# Patient Record
Sex: Female | Born: 1955
Health system: Southern US, Community
[De-identification: ages and names within clinical notes are randomized; demographics above are authoritative.]

## PROBLEM LIST (undated history)

## (undated) DIAGNOSIS — R112 Nausea with vomiting, unspecified: Secondary | ICD-10-CM

## (undated) DIAGNOSIS — K219 Gastro-esophageal reflux disease without esophagitis: Secondary | ICD-10-CM

## (undated) DIAGNOSIS — I2510 Atherosclerotic heart disease of native coronary artery without angina pectoris: Secondary | ICD-10-CM

## (undated) DIAGNOSIS — I4719 Other supraventricular tachycardia: Secondary | ICD-10-CM

## (undated) DIAGNOSIS — Z9289 Personal history of other medical treatment: Secondary | ICD-10-CM

## (undated) DIAGNOSIS — C50919 Malignant neoplasm of unspecified site of unspecified female breast: Secondary | ICD-10-CM

## (undated) DIAGNOSIS — Z9889 Other specified postprocedural states: Secondary | ICD-10-CM

## (undated) DIAGNOSIS — I4892 Unspecified atrial flutter: Secondary | ICD-10-CM

## (undated) DIAGNOSIS — I471 Supraventricular tachycardia, unspecified: Secondary | ICD-10-CM

## (undated) DIAGNOSIS — I480 Paroxysmal atrial fibrillation: Secondary | ICD-10-CM

## (undated) DIAGNOSIS — R931 Abnormal findings on diagnostic imaging of heart and coronary circulation: Secondary | ICD-10-CM

## (undated) HISTORY — DX: Abnormal findings on diagnostic imaging of heart and coronary circulation: R93.1

## (undated) HISTORY — PX: NECK SURGERY: SHX720

## (undated) HISTORY — DX: Supraventricular tachycardia, unspecified: I47.10

## (undated) HISTORY — DX: Atherosclerotic heart disease of native coronary artery without angina pectoris: I25.10

## (undated) HISTORY — PX: ABLATION: SHX5711

## (undated) HISTORY — PX: CHOLECYSTECTOMY: SHX55

## (undated) HISTORY — DX: Unspecified atrial flutter: I48.92

## (undated) HISTORY — DX: Other supraventricular tachycardia: I47.19

## (undated) HISTORY — DX: Malignant neoplasm of unspecified site of unspecified female breast: C50.919

## (undated) HISTORY — PX: ABDOMINAL HYSTERECTOMY: SHX81

## (undated) HISTORY — PX: BREAST LUMPECTOMY: SHX2

## (undated) HISTORY — DX: Supraventricular tachycardia: I47.1

## (undated) HISTORY — PX: KNEE ARTHROSCOPY: SUR90

## (undated) HISTORY — DX: Paroxysmal atrial fibrillation: I48.0

## (undated) HISTORY — DX: Personal history of other medical treatment: Z92.89

---

## 2000-06-19 ENCOUNTER — Encounter: Payer: Self-pay | Admitting: Neurosurgery

## 2000-06-19 ENCOUNTER — Ambulatory Visit (HOSPITAL_COMMUNITY): Admission: RE | Admit: 2000-06-19 | Discharge: 2000-06-19 | Payer: Self-pay | Admitting: Neurosurgery

## 2000-06-25 ENCOUNTER — Inpatient Hospital Stay (HOSPITAL_COMMUNITY): Admission: RE | Admit: 2000-06-25 | Discharge: 2000-06-27 | Payer: Self-pay | Admitting: Neurosurgery

## 2000-06-25 ENCOUNTER — Encounter: Payer: Self-pay | Admitting: Neurosurgery

## 2000-06-26 ENCOUNTER — Encounter: Payer: Self-pay | Admitting: Neurosurgery

## 2000-07-13 ENCOUNTER — Encounter: Payer: Self-pay | Admitting: Neurosurgery

## 2000-07-13 ENCOUNTER — Encounter: Admission: RE | Admit: 2000-07-13 | Discharge: 2000-07-13 | Payer: Self-pay | Admitting: Neurosurgery

## 2000-09-28 ENCOUNTER — Encounter: Admission: RE | Admit: 2000-09-28 | Discharge: 2000-09-28 | Payer: Self-pay | Admitting: Neurosurgery

## 2000-09-28 ENCOUNTER — Encounter: Payer: Self-pay | Admitting: Neurosurgery

## 2002-01-13 HISTORY — PX: BREAST EXCISIONAL BIOPSY: SUR124

## 2002-02-11 ENCOUNTER — Ambulatory Visit (HOSPITAL_BASED_OUTPATIENT_CLINIC_OR_DEPARTMENT_OTHER): Admission: RE | Admit: 2002-02-11 | Discharge: 2002-02-11 | Payer: Self-pay | Admitting: *Deleted

## 2002-02-11 ENCOUNTER — Encounter: Admission: RE | Admit: 2002-02-11 | Discharge: 2002-02-11 | Payer: Self-pay | Admitting: *Deleted

## 2002-08-07 ENCOUNTER — Ambulatory Visit (HOSPITAL_BASED_OUTPATIENT_CLINIC_OR_DEPARTMENT_OTHER): Admission: RE | Admit: 2002-08-07 | Discharge: 2002-08-07 | Payer: Self-pay | Admitting: Orthopedic Surgery

## 2003-08-26 ENCOUNTER — Encounter (HOSPITAL_COMMUNITY): Admission: RE | Admit: 2003-08-26 | Discharge: 2003-09-01 | Payer: Self-pay | Admitting: *Deleted

## 2003-09-28 ENCOUNTER — Ambulatory Visit (HOSPITAL_BASED_OUTPATIENT_CLINIC_OR_DEPARTMENT_OTHER): Admission: RE | Admit: 2003-09-28 | Discharge: 2003-09-28 | Payer: Self-pay | Admitting: *Deleted

## 2003-09-28 ENCOUNTER — Ambulatory Visit (HOSPITAL_COMMUNITY): Admission: RE | Admit: 2003-09-28 | Discharge: 2003-09-28 | Payer: Self-pay | Admitting: *Deleted

## 2005-05-15 ENCOUNTER — Ambulatory Visit: Payer: Self-pay | Admitting: Cardiology

## 2005-08-08 ENCOUNTER — Encounter: Admission: RE | Admit: 2005-08-08 | Discharge: 2005-08-08 | Payer: Self-pay | Admitting: Otolaryngology

## 2005-08-18 ENCOUNTER — Encounter: Admission: RE | Admit: 2005-08-18 | Discharge: 2005-08-18 | Payer: Self-pay | Admitting: Otolaryngology

## 2011-12-23 DIAGNOSIS — M76899 Other specified enthesopathies of unspecified lower limb, excluding foot: Secondary | ICD-10-CM | POA: Insufficient documentation

## 2012-07-02 ENCOUNTER — Other Ambulatory Visit: Payer: Self-pay | Admitting: Radiology

## 2012-07-02 DIAGNOSIS — R922 Inconclusive mammogram: Secondary | ICD-10-CM

## 2012-07-07 ENCOUNTER — Ambulatory Visit
Admission: RE | Admit: 2012-07-07 | Discharge: 2012-07-07 | Disposition: A | Discharge status: Home / Self Care | Payer: BC Managed Care – PPO | Source: Ambulatory Visit | Attending: Radiology | Admitting: Radiology

## 2012-07-07 DIAGNOSIS — R922 Inconclusive mammogram: Secondary | ICD-10-CM

## 2012-07-07 MED ORDER — GADOBENATE DIMEGLUMINE 529 MG/ML IV SOLN
12.0000 mL | Freq: Once | INTRAVENOUS | Status: AC | PRN
  Administered 2012-07-07: 12 mL via INTRAVENOUS

## 2015-05-07 LAB — HM COLONOSCOPY

## 2016-06-05 ENCOUNTER — Ambulatory Visit (INDEPENDENT_AMBULATORY_CARE_PROVIDER_SITE_OTHER): Payer: BC Managed Care – PPO | Admitting: Cardiology

## 2016-06-05 ENCOUNTER — Telehealth: Payer: Self-pay | Admitting: Cardiology

## 2016-06-05 ENCOUNTER — Encounter: Payer: Self-pay | Admitting: Cardiology

## 2016-06-05 ENCOUNTER — Encounter: Payer: Self-pay | Admitting: *Deleted

## 2016-06-05 VITALS — BP 151/82 | HR 58 | Ht 65.0 in | Wt 134.2 lb

## 2016-06-05 DIAGNOSIS — R011 Cardiac murmur, unspecified: Secondary | ICD-10-CM | POA: Diagnosis not present

## 2016-06-05 DIAGNOSIS — R030 Elevated blood-pressure reading, without diagnosis of hypertension: Secondary | ICD-10-CM | POA: Diagnosis not present

## 2016-06-05 DIAGNOSIS — I4892 Unspecified atrial flutter: Secondary | ICD-10-CM | POA: Diagnosis not present

## 2016-06-05 NOTE — Patient Instructions (Signed)
Your physician recommends that you schedule a follow-up appointment in: [place] has recommended you make the following change in your medication:   START ASPIRIN 78 MG DAILY  Your physician has requested that you have an echocardiogram. Echocardiography is a painless test that uses sound waves to create images of your heart. It provides your doctor with information about the size and shape of your heart and how well your heart's chambers and valves are working. This procedure takes approximately one hour. There are no restrictions for this procedure.  Thank you for choosing [facility]!!

## 2016-06-05 NOTE — Progress Notes (Addendum)
Clinical Summary Theresa Hale is a 61 y.o.female seen as new apatient, she is referred by Dr Quillian Quince for atrial flutter.   1. Atrial flutter - several year history of palpitations. Reports a normal cardiac monitor several years ago.  - she reports a recent episode much more severe than any prior. Lasted 3 days, severe palpitations. No other associated symptoms - seen by pcp, EKG at that visit reviewed and shows aflutter with RVR.  - she was started on Toprol XL 25mg  prn. After taking 2 doses symptoms resolved, has had no recurrence.      Past Medical History:  Diagnosis Date  . Atrial flutter (HCC)      Allergies  Allergen Reactions  . Penicillins Swelling  . Sulfa Antibiotics Nausea And Vomiting     Current Outpatient Prescriptions  Medication Sig Dispense Refill  . aspirin EC 81 MG tablet Take 81 mg by mouth daily.    . metoprolol succinate (TOPROL XL) 25 MG 24 hr tablet Take 25 mg by mouth daily as needed.     No current facility-administered medications for this visit.      Past Surgical History:  Procedure Laterality Date  . ABDOMINAL HYSTERECTOMY    . BREAST LUMPECTOMY    . CESAREAN SECTION    . CHOLECYSTECTOMY    . KNEE ARTHROSCOPY    . NECK SURGERY       Allergies  Allergen Reactions  . Penicillins Swelling  . Sulfa Antibiotics Nausea And Vomiting      Family History  Problem Relation Age of Onset  . Stroke Mother   . Breast cancer Mother   . Parkinson's disease Father   . Alzheimer's disease Father   . Breast cancer Sister      Social History Ms. Byers reports that she has never smoked. She has never used smokeless tobacco. Ms. Brosh has no alcohol history on file.   Review of Systems CONSTITUTIONAL: No weight loss, fever, chills, weakness or fatigue.  HEENT: Eyes: No visual loss, blurred vision, double vision or yellow sclerae.No hearing loss, sneezing, congestion, runny nose or sore throat.  SKIN: No rash or itching.    CARDIOVASCULAR: per HPI RESPIRATORY: No shortness of breath, cough or sputum.  GASTROINTESTINAL: No anorexia, nausea, vomiting or diarrhea. No abdominal pain or blood.  GENITOURINARY: No burning on urination, no polyuria NEUROLOGICAL: No headache, dizziness, syncope, paralysis, ataxia, numbness or tingling in the extremities. No change in bowel or bladder control.  MUSCULOSKELETAL: No muscle, back pain, joint pain or stiffness.  LYMPHATICS: No enlarged nodes. No history of splenectomy.  PSYCHIATRIC: No history of depression or anxiety.  ENDOCRINOLOGIC: No reports of sweating, cold or heat intolerance. No polyuria or polydipsia.  Marland Kitchen   Physical Examination Vitals:   06/05/16 1017 06/05/16 1025  BP: (!) 146/79 (!) 151/82  Pulse: (!) 58 (!) 58   Filed Weights   06/05/16 1017  Weight: 134 lb 3.2 oz (60.9 kg)    Gen: resting comfortably, no acute distress HEENT: no scleral icterus, pupils equal round and reactive, no palptable cervical adenopathy,  CV: RRR, 2/6 systolic murmur at apex, no jvd Resp: Clear to auscultation bilaterally GI: abdomen is soft, non-tender, non-distended, normal bowel sounds, no hepatosplenomegaly MSK: extremities are warm, no edema.  Skin: warm, no rash Neuro:  no focal deficits Psych: appropriate affect      Assessment and Plan  1. Aflutter - new diagnosis of paroxysmal aflutter. - no recurrence of symptoms, she  is on Toprol XL prn. If recurrent would try Toprol XL 12.5mg  daily, somewhat limited on av nodal agent dosing given her baseline low normal heart rates. If refractory to medical therapy or cannot tolerate can consider ablation.  - EKG in clinic today show SR at 58 - CHADS2Vasc score is 1, start ASA 81mg  daily - request labs from pcp, if K,Mg, TSH not included will need to order next visit. Obtain echo.  2. Heart murmur - obtain echo  3. Elevated blood pressure - isolated elevated bp today, from pcp note she was at goal during that visit.  No history of HTN - continue to monitor at this time, if recurrent may need medical therapy   F/u 4 months   Arnoldo Lenis, M.D.

## 2016-06-05 NOTE — Telephone Encounter (Signed)
ECHO scheduled May 9th in [place]

## 2016-06-13 ENCOUNTER — Ambulatory Visit: Payer: BC Managed Care – PPO | Admitting: Cardiology

## 2016-06-21 ENCOUNTER — Other Ambulatory Visit: Payer: Self-pay

## 2016-06-21 ENCOUNTER — Ambulatory Visit (INDEPENDENT_AMBULATORY_CARE_PROVIDER_SITE_OTHER): Payer: BC Managed Care – PPO

## 2016-06-21 DIAGNOSIS — I4892 Unspecified atrial flutter: Secondary | ICD-10-CM

## 2016-06-26 ENCOUNTER — Telehealth: Payer: Self-pay | Admitting: *Deleted

## 2016-06-26 NOTE — Telephone Encounter (Signed)
Pt aware and voiced understanding - routed to pcp  

## 2016-06-26 NOTE — Telephone Encounter (Signed)
-----   Message from Arnoldo Lenis, MD sent at 06/23/2016  3:58 PM EDT ----- Echo looks good, normal heart function. Some of her heart valves are mildly thickened which creates a murmur but overall are working just fine, this is not something to worry about  J BrancH MD

## 2016-10-11 ENCOUNTER — Telehealth: Payer: Self-pay | Admitting: *Deleted

## 2016-10-11 NOTE — Telephone Encounter (Signed)
Cx Friday appt - 6 month recall placed

## 2016-10-11 NOTE — Telephone Encounter (Signed)
That would be fine ° °J Ida Uppal MD °

## 2016-10-11 NOTE — Telephone Encounter (Signed)
Pt scheduled for Friday 8/31 but says since echo was normal and pt denies symptoms she would like to push appt out for 6 months and would call if needed prior - routed to Dr Harl Bowie if ok

## 2016-10-13 ENCOUNTER — Ambulatory Visit: Payer: BC Managed Care – PPO | Admitting: Cardiology

## 2017-02-13 HISTORY — PX: BREAST LUMPECTOMY: SHX2

## 2017-10-02 HISTORY — PX: BREAST BIOPSY: SHX20

## 2017-10-09 ENCOUNTER — Encounter: Payer: Self-pay | Admitting: Hematology and Oncology

## 2017-10-09 ENCOUNTER — Other Ambulatory Visit: Payer: Self-pay | Admitting: Radiology

## 2017-10-11 ENCOUNTER — Other Ambulatory Visit: Payer: Self-pay | Admitting: Radiology

## 2017-10-18 ENCOUNTER — Inpatient Hospital Stay: Payer: BC Managed Care – PPO | Attending: Hematology and Oncology | Admitting: Hematology and Oncology

## 2017-10-18 ENCOUNTER — Other Ambulatory Visit: Payer: Self-pay | Admitting: General Surgery

## 2017-10-18 ENCOUNTER — Telehealth: Payer: Self-pay | Admitting: *Deleted

## 2017-10-18 ENCOUNTER — Encounter: Payer: Self-pay | Admitting: *Deleted

## 2017-10-18 DIAGNOSIS — Z1239 Encounter for other screening for malignant neoplasm of breast: Secondary | ICD-10-CM

## 2017-10-18 DIAGNOSIS — C50411 Malignant neoplasm of upper-outer quadrant of right female breast: Secondary | ICD-10-CM | POA: Diagnosis present

## 2017-10-18 DIAGNOSIS — Z170 Estrogen receptor positive status [ER+]: Secondary | ICD-10-CM | POA: Diagnosis not present

## 2017-10-18 DIAGNOSIS — Z803 Family history of malignant neoplasm of breast: Secondary | ICD-10-CM | POA: Insufficient documentation

## 2017-10-18 MED ORDER — LETROZOLE 2.5 MG PO TABS: 2.5000 mg | ORAL_TABLET | Freq: Every day | ORAL | 3 refills | Status: DC

## 2017-10-18 NOTE — Assessment & Plan Note (Signed)
10/09/2017: Palpable lump in the right breast with a history of right breast atypia in mother and sister with breast cancers, 1.9 cm mass at 10 o'clock position right breast biopsy 10 o'clock position 5 cm from nipple: Grade 1 invasive ductal carcinoma ER 60%, PR 40%, Ki-67 2%, HER-2 negative ratio 1.35 T1cN1a stage Ib AJCC 8  Pathology and radiology counseling:Discussed with the patient, the details of pathology including the type of breast cancer,the clinical staging, the significance of ER, PR and HER-2/neu receptors and the implications for treatment. After reviewing the pathology in detail, we proceeded to discuss the different treatment options between surgery, radiation, chemotherapy, antiestrogen therapies.  Recommendations: 1. Breast conserving surgery with axillary node dissection followed by 2. Mammaprint testing to determine if chemotherapy would be of any benefit followed by 3. Adjuvant radiation therapy followed by 4. Adjuvant antiestrogen therapy  Mammaprint counseling: MINDACT is a prospective, randomized phase III controlled trial that investigates the clinical utility of MammaPrint, when compared to standard clinical pathological criteria, with 6,693 patients enrolled from over 111 institutions. Clinical high-risk patients with a Low Risk MammaPrint result, including 48% node-positive, had 5-year distant metastasis-free survival rate in excess of 94 percent, whether randomized to receive adjuvant chemotherapy or not proving MammaPrint's ability to safely identify Low Risk patients.  Return to clinic after surgery to discuss final pathology report and then determine if Mammaprint testing will need to be sent.

## 2017-10-18 NOTE — Progress Notes (Signed)
[place] CONSULT NOTE  Patient Care Team: Caryl Bis, MD as PCP - General (Family Medicine)  CHIEF COMPLAINTS/PURPOSE OF CONSULTATION:  Newly diagnosed breast cancer  HISTORY OF PRESENTING ILLNESS:  Theresa Hale 62 y.o. female is here because of recent diagnosis of right breast cancer.  Patient felt a lump in the right breast and underwent evaluation at [facility].  Initial mammograms did not show any abnormality but after further evaluation and ultrasound she was noted to have a 1.9 cm mass at 10 o'clock position of the right breast.  Biopsy of the breast mass was positive for invasive ductal carcinoma grade 1 that was ER PR positive HER-2 negative with a Ki-67 of 2%.  She had an ultrasound of the axilla which revealed one enlarged lymph node.  Biopsy of the lymph node was also positive for breast cancer.  She was seen by Dr. Marlou Starks who recommended neoadjuvant therapy and she was sent to [place] for further discussion.  She is accompanied today by her husband.  They have a plan to spend 2 weeks in [place] in the coming week.  I reviewed her records extensively and collaborated the history with the patient.  SUMMARY OF ONCOLOGIC HISTORY:   Malignant neoplasm of upper-outer quadrant of right breast in female, estrogen receptor positive ([place])   10/09/2017 Initial Diagnosis    Palpable lump in the right breast with a history of right breast atypia in mother and sister with breast cancers, 1.9 cm mass at 10 o'clock position right breast biopsy 10 o'clock position 5 cm from nipple: Grade 1 invasive ductal carcinoma ER 60%, PR 40%, Ki-67 2%, HER-2 negative ratio 1.35 T1cN1a stage Ib AJCC 8    MEDICAL HISTORY:  Past Medical History:  Diagnosis Date  . Atrial flutter ([place])     SURGICAL HISTORY:  The histories are not reviewed yet. Please review them in the "History" navigator section and refresh this [place].  SOCIAL HISTORY: Social History   Socioeconomic History  . Marital  status: Married    Spouse name: Not on file  . Number of children: Not on file  . Years of education: Not on file  . Highest education level: Not on file  Occupational History  . Not on file  Social Needs  . Financial resource strain: Not on file  . Food insecurity:    Worry: Not on file    Inability: Not on file  . Transportation needs:    Medical: Not on file    Non-medical: Not on file  Tobacco Use  . Smoking status: Never Smoker  . Smokeless tobacco: Never Used  Substance and Sexual Activity  . Alcohol use: Not on file  . Drug use: Not on file  . Sexual activity: Not on file  Lifestyle  . Physical activity:    Days per week: Not on file    Minutes per session: Not on file  . Stress: Not on file  Relationships  . Social connections:    Talks on phone: Not on file    Gets together: Not on file    Attends religious service: Not on file    Active member of club or organization: Not on file    Attends meetings of clubs or organizations: Not on file    Relationship status: Not on file  . Intimate partner violence:    Fear of current or ex partner: Not on file    Emotionally abused: Not on file    Physically abused:  Not on file    Forced sexual activity: Not on file  Other Topics Concern  . Not on file  Social History Narrative  . Not on file    FAMILY HISTORY: Family History  Problem Relation Age of Onset  . Stroke Mother   . Breast cancer Mother   . Parkinson's disease Father   . Alzheimer's disease Father   . Breast cancer Sister     ALLERGIES:  is allergic to penicillins and sulfa antibiotics.  MEDICATIONS:  Current Outpatient Medications  Medication Sig Dispense Refill  . letrozole (FEMARA) 2.5 MG tablet Take 1 tablet (2.5 mg total) by mouth daily. 90 tablet 3  . metoprolol succinate (TOPROL XL) 25 MG 24 hr tablet Take 25 mg by mouth daily as needed.     No current facility-administered medications for this visit.     REVIEW OF SYSTEMS:    Constitutional: Denies fevers, chills or abnormal night sweats Eyes: Denies blurriness of vision, double vision or watery eyes Ears, nose, mouth, throat, and face: Denies mucositis or sore throat Respiratory: Denies cough, dyspnea or wheezes Cardiovascular: Denies palpitation, chest discomfort or lower extremity swelling Gastrointestinal:  Denies nausea, heartburn or change in bowel habits Skin: Denies abnormal skin rashes Lymphatics: Denies new lymphadenopathy or easy bruising Neurological:Denies numbness, tingling or new weaknesses Behavioral/Psych: Mood is stable, no new changes  Breast: Palpable lump in the right breast All other systems were reviewed with the patient and are negative.  PHYSICAL EXAMINATION: ECOG PERFORMANCE STATUS: 1 - Symptomatic but completely ambulatory  Vitals:   10/18/17 1327  BP: (!) 161/70  Pulse: 71  Resp: 19  Temp: 98 F (36.7 C)  SpO2: 99%   Filed Weights   10/18/17 1327  Weight: 130 lb 12.8 oz (59.3 kg)    GENERAL:alert, no distress and comfortable SKIN: skin color, texture, turgor are normal, no rashes or significant lesions EYES: normal, conjunctiva are pink and non-injected, sclera clear OROPHARYNX:no exudate, no erythema and lips, buccal mucosa, and tongue normal  NECK: supple, thyroid normal size, non-tender, without nodularity LYMPH:  no palpable lymphadenopathy in the cervical, axillary or inguinal LUNGS: clear to auscultation and percussion with normal breathing effort HEART: regular rate & rhythm and no murmurs and no lower extremity edema ABDOMEN:abdomen soft, non-tender and normal bowel sounds Musculoskeletal:no cyanosis of digits and no clubbing  PSYCH: alert & oriented x 3 with fluent speech NEURO: no focal motor/sensory deficits BREAST: Scar tissue from previous breast surgeries and palpable lump in the right breast. No palpable axillary or supraclavicular lymphadenopathy (exam performed in the presence of a chaperone)    RADIOGRAPHIC STUDIES: I have personally reviewed the radiological reports and agreed with the findings in the report.  ASSESSMENT AND PLAN:  Malignant neoplasm of upper-outer quadrant of right breast in female, estrogen receptor positive ([place]) 10/09/2017: Palpable lump in the right breast with a history of right breast atypia in mother and sister with breast cancers, 1.9 cm mass at 10 o'clock position right breast biopsy 10 o'clock position 5 cm from nipple: Grade 1 invasive ductal carcinoma ER 60%, PR 40%, Ki-67 2%, HER-2 negative ratio 1.35 T1cN1a stage Ib AJCC 8  Pathology and radiology counseling:Discussed with the patient, the details of pathology including the type of breast cancer,the clinical staging, the significance of ER, PR and HER-2/neu receptors and the implications for treatment. After reviewing the pathology in detail, we proceeded to discuss the different treatment options between surgery, radiation, chemotherapy, antiestrogen therapies.  Recommendations: 1.  Neoadjuvant  therapy: Based upon Mammaprint test results we may need to do neoadjuvant chemotherapy.  If it is low risk then she will remain on neoadjuvant antiestrogen therapy for 6 months.   2. Breast conserving surgery with targeted node dissection followed by 3. Adjuvant radiation therapy followed by 4.  Continued adjuvant antiestrogen therapy  Mammaprint counseling: MINDACT is a prospective, randomized phase III controlled trial that investigates the clinical utility of MammaPrint, when compared to standard clinical pathological criteria, with 6,693 patients enrolled from over 111 institutions. Clinical high-risk patients with a Low Risk MammaPrint result, including 48% node-positive, had 5-year distant metastasis-free survival rate in excess of 94 percent, whether randomized to receive adjuvant chemotherapy or not proving MammaPrint's ability to safely identify Low Risk patients.  Letrozole counseling:We discussed the  risks and benefits of anti-estrogen therapy with aromatase inhibitors. These include but not limited to insomnia, hot flashes, mood changes, vaginal dryness, bone density loss, and weight gain. We strongly believe that the benefits far outweigh the risks. Patient understands these risks and consented to starting treatment. Planned treatment duration is 7 years.  Return to clinic in 3 months with a mammogram and ultrasound.  All questions were answered. The patient knows to call the clinic with any problems, questions or concerns.    Harriette Ohara, MD 10/18/17

## 2017-10-18 NOTE — Telephone Encounter (Signed)
New patient referral sent over by Dr. Marlou Starks.  [facility] notified patient of appointment today 10/18/17 at 1:30pm.

## 2017-10-19 ENCOUNTER — Telehealth: Payer: Self-pay | Admitting: *Deleted

## 2017-10-19 ENCOUNTER — Telehealth: Payer: Self-pay | Admitting: Hematology and Oncology

## 2017-10-19 NOTE — Telephone Encounter (Signed)
Ordered mammaprint (core) per Dr. Lindi Adie.  Faxed requisition to [facility] and confirmed receipt.

## 2017-10-19 NOTE — Telephone Encounter (Signed)
Mailed pt calendar  °

## 2017-10-21 ENCOUNTER — Ambulatory Visit
Admission: RE | Admit: 2017-10-21 | Discharge: 2017-10-21 | Disposition: A | Discharge status: Home / Self Care | Payer: BC Managed Care – PPO | Source: Ambulatory Visit | Attending: General Surgery | Admitting: General Surgery

## 2017-10-21 DIAGNOSIS — C50411 Malignant neoplasm of upper-outer quadrant of right female breast: Secondary | ICD-10-CM

## 2017-10-21 DIAGNOSIS — Z170 Estrogen receptor positive status [ER+]: Principal | ICD-10-CM

## 2017-10-21 MED ORDER — GADOBENATE DIMEGLUMINE 529 MG/ML IV SOLN
12.0000 mL | Freq: Once | INTRAVENOUS | Status: AC | PRN
  Administered 2017-10-21: 12 mL via INTRAVENOUS

## 2017-10-26 ENCOUNTER — Telehealth: Payer: Self-pay | Admitting: *Deleted

## 2017-10-26 NOTE — Telephone Encounter (Signed)
Received Mammaprint results of low risk. Physician team notified. Pt currently in [place] will call on pt return on 9/24

## 2017-11-07 ENCOUNTER — Telehealth: Payer: Self-pay | Admitting: *Deleted

## 2017-11-07 NOTE — Telephone Encounter (Signed)
Spoke with patient to give her the low risk mammaprint results.  Patient is going to talk with Dr. Marlou Starks because he may go ahead and do surgery 1st.  Will look for sx date and then make appt back with Dr. Lindi Adie. Patient verbalized understanding.

## 2017-11-08 ENCOUNTER — Ambulatory Visit: Payer: Self-pay | Admitting: General Surgery

## 2017-11-08 DIAGNOSIS — Z170 Estrogen receptor positive status [ER+]: Principal | ICD-10-CM

## 2017-11-08 DIAGNOSIS — C50411 Malignant neoplasm of upper-outer quadrant of right female breast: Secondary | ICD-10-CM

## 2017-11-09 ENCOUNTER — Other Ambulatory Visit: Payer: Self-pay | Admitting: *Deleted

## 2017-11-12 ENCOUNTER — Telehealth: Payer: Self-pay | Admitting: Hematology and Oncology

## 2017-11-12 ENCOUNTER — Encounter: Payer: Self-pay | Admitting: Hematology and Oncology

## 2017-11-12 NOTE — Telephone Encounter (Signed)
Scheduled appt per 9/27 sch message - pt is aware of appt date and time and sent reminder letter in the mail with appt date and time.

## 2017-11-12 NOTE — Progress Notes (Signed)
Faxed office notes for a referral  to [facility] at [phone], confirmation received.

## 2017-11-19 ENCOUNTER — Other Ambulatory Visit: Payer: Self-pay

## 2017-11-19 ENCOUNTER — Encounter (HOSPITAL_BASED_OUTPATIENT_CLINIC_OR_DEPARTMENT_OTHER): Payer: Self-pay | Admitting: *Deleted

## 2017-11-19 NOTE — Progress Notes (Signed)
Pre op phone call done. Pt denies any recent feelings of her heart racing or palpitations. States that she only takes the toprol prn and has only ever needed it once. Will repeat EKG prior to surgery. Chart reviewed with Dr.Witman, no further testing needed as long as EKG is WNL.

## 2017-11-21 ENCOUNTER — Encounter (HOSPITAL_BASED_OUTPATIENT_CLINIC_OR_DEPARTMENT_OTHER)
Admission: RE | Admit: 2017-11-21 | Discharge: 2017-11-21 | Disposition: A | Discharge status: Home / Self Care | Payer: BC Managed Care – PPO | Source: Ambulatory Visit | Attending: General Surgery | Admitting: General Surgery

## 2017-11-21 DIAGNOSIS — Z01810 Encounter for preprocedural cardiovascular examination: Secondary | ICD-10-CM | POA: Insufficient documentation

## 2017-11-21 NOTE — Progress Notes (Signed)
Ensure pre surgery drink given with instructions to complete  By [place], pt verbalized understanding.

## 2017-11-22 DIAGNOSIS — C50919 Malignant neoplasm of unspecified site of unspecified female breast: Secondary | ICD-10-CM | POA: Insufficient documentation

## 2017-11-23 ENCOUNTER — Telehealth: Payer: Self-pay | Admitting: Hematology and Oncology

## 2017-11-23 NOTE — Telephone Encounter (Signed)
Faxed medical records to [facility], Release ID: [id number]

## 2017-11-26 ENCOUNTER — Ambulatory Visit (HOSPITAL_BASED_OUTPATIENT_CLINIC_OR_DEPARTMENT_OTHER): Payer: BC Managed Care – PPO | Admitting: Anesthesiology

## 2017-11-26 ENCOUNTER — Encounter (HOSPITAL_BASED_OUTPATIENT_CLINIC_OR_DEPARTMENT_OTHER): Payer: Self-pay | Admitting: Certified Registered"

## 2017-11-26 ENCOUNTER — Encounter (HOSPITAL_BASED_OUTPATIENT_CLINIC_OR_DEPARTMENT_OTHER)
Admission: RE | Disposition: A | Discharge status: Home / Self Care | Payer: Self-pay | Source: Ambulatory Visit | Attending: General Surgery

## 2017-11-26 ENCOUNTER — Encounter (HOSPITAL_COMMUNITY)
Admission: RE | Admit: 2017-11-26 | Discharge: 2017-11-26 | Disposition: A | Discharge status: Home / Self Care | Payer: BC Managed Care – PPO | Source: Ambulatory Visit | Attending: General Surgery | Admitting: General Surgery

## 2017-11-26 ENCOUNTER — Ambulatory Visit (HOSPITAL_BASED_OUTPATIENT_CLINIC_OR_DEPARTMENT_OTHER)
Admission: RE | Admit: 2017-11-26 | Discharge: 2017-11-26 | Disposition: A | Discharge status: Home / Self Care | Payer: BC Managed Care – PPO | Source: Ambulatory Visit | Attending: General Surgery | Admitting: General Surgery

## 2017-11-26 DIAGNOSIS — Z880 Allergy status to penicillin: Secondary | ICD-10-CM | POA: Diagnosis not present

## 2017-11-26 DIAGNOSIS — Z170 Estrogen receptor positive status [ER+]: Secondary | ICD-10-CM | POA: Insufficient documentation

## 2017-11-26 DIAGNOSIS — C50411 Malignant neoplasm of upper-outer quadrant of right female breast: Secondary | ICD-10-CM

## 2017-11-26 DIAGNOSIS — Z8249 Family history of ischemic heart disease and other diseases of the circulatory system: Secondary | ICD-10-CM | POA: Insufficient documentation

## 2017-11-26 DIAGNOSIS — Z882 Allergy status to sulfonamides status: Secondary | ICD-10-CM | POA: Diagnosis not present

## 2017-11-26 DIAGNOSIS — Z90710 Acquired absence of both cervix and uterus: Secondary | ICD-10-CM | POA: Diagnosis not present

## 2017-11-26 DIAGNOSIS — Z803 Family history of malignant neoplasm of breast: Secondary | ICD-10-CM | POA: Diagnosis not present

## 2017-11-26 DIAGNOSIS — C773 Secondary and unspecified malignant neoplasm of axilla and upper limb lymph nodes: Secondary | ICD-10-CM | POA: Diagnosis not present

## 2017-11-26 HISTORY — DX: Gastro-esophageal reflux disease without esophagitis: K21.9

## 2017-11-26 HISTORY — PX: BREAST LUMPECTOMY WITH RADIOACTIVE SEED AND SENTINEL LYMPH NODE BIOPSY: SHX6550

## 2017-11-26 HISTORY — DX: Nausea with vomiting, unspecified: R11.2

## 2017-11-26 HISTORY — DX: Other specified postprocedural states: Z98.890

## 2017-11-26 SURGERY — BREAST LUMPECTOMY WITH RADIOACTIVE SEED AND SENTINEL LYMPH NODE BIOPSY
Anesthesia: General | Site: Breast | Laterality: Right

## 2017-11-26 MED ORDER — ONDANSETRON HCL 4 MG/2ML IJ SOLN
INTRAMUSCULAR | Status: DC | PRN
  Administered 2017-11-26: 4 mg via INTRAVENOUS

## 2017-11-26 MED ORDER — MIDAZOLAM HCL 2 MG/2ML IJ SOLN
1.0000 mg | INTRAMUSCULAR | Status: DC | PRN
  Administered 2017-11-26: 2 mg via INTRAVENOUS

## 2017-11-26 MED ORDER — VANCOMYCIN HCL IN DEXTROSE 1-5 GM/200ML-% IV SOLN
INTRAVENOUS | Status: AC
  Filled 2017-11-26: qty 200

## 2017-11-26 MED ORDER — FENTANYL CITRATE (PF) 100 MCG/2ML IJ SOLN: 25.0000 ug | INTRAMUSCULAR | Status: DC | PRN

## 2017-11-26 MED ORDER — FENTANYL CITRATE (PF) 100 MCG/2ML IJ SOLN
50.0000 ug | INTRAMUSCULAR | Status: DC | PRN
  Administered 2017-11-26: 25 ug via INTRAVENOUS
  Administered 2017-11-26: 100 ug via INTRAVENOUS

## 2017-11-26 MED ORDER — BUPIVACAINE-EPINEPHRINE 0.25% -1:200000 IJ SOLN
INTRAMUSCULAR | Status: DC | PRN
  Administered 2017-11-26: 18 mL

## 2017-11-26 MED ORDER — PROPOFOL 10 MG/ML IV BOLUS
INTRAVENOUS | Status: DC | PRN
  Administered 2017-11-26: 150 mg via INTRAVENOUS

## 2017-11-26 MED ORDER — CHLORHEXIDINE GLUCONATE CLOTH 2 % EX PADS: 6.0000 | MEDICATED_PAD | Freq: Once | CUTANEOUS | Status: DC

## 2017-11-26 MED ORDER — TECHNETIUM TC 99M SULFUR COLLOID FILTERED
1.0000 | Freq: Once | INTRAVENOUS | Status: AC | PRN
  Administered 2017-11-26: 1 via INTRADERMAL

## 2017-11-26 MED ORDER — FENTANYL CITRATE (PF) 100 MCG/2ML IJ SOLN
INTRAMUSCULAR | Status: AC
  Filled 2017-11-26: qty 2

## 2017-11-26 MED ORDER — LACTATED RINGERS IV SOLN
INTRAVENOUS | Status: DC
  Administered 2017-11-26 (×2): via INTRAVENOUS

## 2017-11-26 MED ORDER — GABAPENTIN 300 MG PO CAPS
300.0000 mg | ORAL_CAPSULE | ORAL | Status: AC
  Administered 2017-11-26: 300 mg via ORAL

## 2017-11-26 MED ORDER — ACETAMINOPHEN 500 MG PO TABS
1000.0000 mg | ORAL_TABLET | ORAL | Status: AC
  Administered 2017-11-26: 1000 mg via ORAL

## 2017-11-26 MED ORDER — SCOPOLAMINE 1 MG/3DAYS TD PT72
MEDICATED_PATCH | TRANSDERMAL | Status: AC
  Filled 2017-11-26: qty 1

## 2017-11-26 MED ORDER — PROMETHAZINE HCL 25 MG/ML IJ SOLN: 6.2500 mg | INTRAMUSCULAR | Status: DC | PRN

## 2017-11-26 MED ORDER — PROPOFOL 500 MG/50ML IV EMUL
INTRAVENOUS | Status: DC | PRN
  Administered 2017-11-26: 25 ug/kg/min via INTRAVENOUS

## 2017-11-26 MED ORDER — MIDAZOLAM HCL 2 MG/2ML IJ SOLN
INTRAMUSCULAR | Status: AC
  Filled 2017-11-26: qty 2

## 2017-11-26 MED ORDER — SCOPOLAMINE 1 MG/3DAYS TD PT72
1.0000 | MEDICATED_PATCH | Freq: Once | TRANSDERMAL | Status: DC | PRN
  Administered 2017-11-26: 1.5 mg via TRANSDERMAL

## 2017-11-26 MED ORDER — ACETAMINOPHEN 500 MG PO TABS
ORAL_TABLET | ORAL | Status: AC
  Filled 2017-11-26: qty 2

## 2017-11-26 MED ORDER — HYDROCODONE-ACETAMINOPHEN 5-325 MG PO TABS: 1.0000 | ORAL_TABLET | Freq: Four times a day (QID) | ORAL | 0 refills | Status: DC | PRN

## 2017-11-26 MED ORDER — DEXAMETHASONE SODIUM PHOSPHATE 4 MG/ML IJ SOLN
INTRAMUSCULAR | Status: DC | PRN
  Administered 2017-11-26: 10 mg via INTRAVENOUS

## 2017-11-26 MED ORDER — 0.9 % SODIUM CHLORIDE (POUR BTL) OPTIME
TOPICAL | Status: DC | PRN
  Administered 2017-11-26: 1000 mL

## 2017-11-26 MED ORDER — GABAPENTIN 300 MG PO CAPS
ORAL_CAPSULE | ORAL | Status: AC
  Filled 2017-11-26: qty 1

## 2017-11-26 MED ORDER — EPHEDRINE SULFATE 50 MG/ML IJ SOLN
INTRAMUSCULAR | Status: DC | PRN
  Administered 2017-11-26: 10 mg via INTRAVENOUS

## 2017-11-26 MED ORDER — VANCOMYCIN HCL IN DEXTROSE 1-5 GM/200ML-% IV SOLN
1000.0000 mg | INTRAVENOUS | Status: AC
  Administered 2017-11-26: 1000 mg via INTRAVENOUS

## 2017-11-26 MED ORDER — BUPIVACAINE-EPINEPHRINE (PF) 0.5% -1:200000 IJ SOLN
INTRAMUSCULAR | Status: DC | PRN
  Administered 2017-11-26: 30 mL

## 2017-11-26 SURGICAL SUPPLY — 46 items
ADH SKN CLS APL DERMABOND .7 (GAUZE/BANDAGES/DRESSINGS) ×1
APPLIER CLIP 9.375 MED OPEN (MISCELLANEOUS) ×3
APR CLP MED 9.3 20 MLT OPN (MISCELLANEOUS) ×1
BLADE SURG 15 STRL LF DISP TIS (BLADE) ×1 IMPLANT
BLADE SURG 15 STRL SS (BLADE) ×3
CANISTER SUC SOCK COL 7IN (MISCELLANEOUS) IMPLANT
CANISTER SUCT 1200ML W/VALVE (MISCELLANEOUS) IMPLANT
CHLORAPREP W/TINT 26ML (MISCELLANEOUS) ×3 IMPLANT
CLIP APPLIE 9.375 MED OPEN (MISCELLANEOUS) ×1 IMPLANT
COVER BACK TABLE 60X90IN (DRAPES) ×3 IMPLANT
COVER MAYO STAND STRL (DRAPES) ×3 IMPLANT
COVER PROBE W GEL 5X96 (DRAPES) ×3 IMPLANT
COVER WAND RF STERILE (DRAPES) IMPLANT
DECANTER SPIKE VIAL GLASS SM (MISCELLANEOUS) IMPLANT
DERMABOND ADVANCED (GAUZE/BANDAGES/DRESSINGS) ×2
DERMABOND ADVANCED .7 DNX12 (GAUZE/BANDAGES/DRESSINGS) ×1 IMPLANT
DEVICE DUBIN W/COMP PLATE 8390 (MISCELLANEOUS) ×3 IMPLANT
DRAPE LAPAROSCOPIC ABDOMINAL (DRAPES) ×3 IMPLANT
DRAPE UTILITY XL STRL (DRAPES) ×3 IMPLANT
ELECT COATED BLADE 2.86 ST (ELECTRODE) ×3 IMPLANT
ELECT REM PT RETURN 9FT ADLT (ELECTROSURGICAL) ×3
ELECTRODE REM PT RTRN 9FT ADLT (ELECTROSURGICAL) ×1 IMPLANT
GLOVE BIO SURGEON STRL SZ7.5 (GLOVE) ×5 IMPLANT
GOWN STRL REUS W/ TWL LRG LVL3 (GOWN DISPOSABLE) ×2 IMPLANT
GOWN STRL REUS W/TWL LRG LVL3 (GOWN DISPOSABLE) ×6
ILLUMINATOR WAVEGUIDE N/F (MISCELLANEOUS) IMPLANT
KIT MARKER MARGIN INK (KITS) ×3 IMPLANT
LIGHT WAVEGUIDE WIDE FLAT (MISCELLANEOUS) IMPLANT
NDL HYPO 25X1 1.5 SAFETY (NEEDLE) ×1 IMPLANT
NDL SAFETY ECLIPSE 18X1.5 (NEEDLE) IMPLANT
NEEDLE HYPO 18GX1.5 SHARP (NEEDLE)
NEEDLE HYPO 25X1 1.5 SAFETY (NEEDLE) ×3 IMPLANT
NS IRRIG 1000ML POUR BTL (IV SOLUTION) IMPLANT
PACK BASIN DAY SURGERY FS (CUSTOM PROCEDURE TRAY) ×3 IMPLANT
PENCIL BUTTON HOLSTER BLD 10FT (ELECTRODE) ×3 IMPLANT
SLEEVE SCD COMPRESS KNEE MED (MISCELLANEOUS) ×3 IMPLANT
SPONGE LAP 18X18 RF (DISPOSABLE) ×3 IMPLANT
SUT MON AB 4-0 PC3 18 (SUTURE) ×4 IMPLANT
SUT SILK 2 0 SH (SUTURE) IMPLANT
SUT VICRYL 3-0 CR8 SH (SUTURE) ×3 IMPLANT
SYR CONTROL 10ML LL (SYRINGE) ×3 IMPLANT
TOWEL GREEN STERILE FF (TOWEL DISPOSABLE) ×3 IMPLANT
TOWEL OR NON WOVEN STRL DISP B (DISPOSABLE) ×3 IMPLANT
TUBE CONNECTING 20'X1/4 (TUBING)
TUBE CONNECTING 20X1/4 (TUBING) IMPLANT
YANKAUER SUCT BULB TIP NO VENT (SUCTIONS) IMPLANT

## 2017-11-26 NOTE — Anesthesia Postprocedure Evaluation (Signed)
Anesthesia Post Note  Patient: Theresa Hale  Procedure(s) Performed: RIGHT BREAST LUMPECTOMY WITH SENTINEL NODE MAPPING AND TARGETED NODE DISECTION ERAS PATHWAY (Right Breast)     Patient location during evaluation: PACU Anesthesia Type: General Level of consciousness: sedated Pain management: pain level controlled Vital Signs Assessment: post-procedure vital signs reviewed and stable Respiratory status: spontaneous breathing and respiratory function stable Cardiovascular status: stable Postop Assessment: no apparent nausea or vomiting Anesthetic complications: no    Last Vitals:  Vitals:   11/26/17 1125 11/26/17 1152  BP:  (!) 145/61  Pulse: 67 64  Resp: 13 16  Temp:  36.5 C  SpO2: 100% 100%    Last Pain:  Vitals:   11/26/17 1152  TempSrc:   PainSc: 0-No pain                 Gage Treiber DANIEL

## 2017-11-26 NOTE — Op Note (Signed)
11/26/2017  10:38 AM  PATIENT:  Theresa Hale  62 y.o. female  PRE-OPERATIVE DIAGNOSIS:  right breast cancer  POST-OPERATIVE DIAGNOSIS:  right breast cancer  PROCEDURE:  Procedure(s): RIGHT BREAST LUMPECTOMY WITH DEEP RIGHT AXILLARY SENTINEL NODE MAPPING AND TARGETED NODE DISECTION ERAS PATHWAY (Right)  SURGEON:  Surgeon(s) and Role:    * Jovita Kussmaul, MD - Primary  PHYSICIAN ASSISTANT:   ASSISTANTS: none   ANESTHESIA:   local and general  EBL:  Minimal   BLOOD ADMINISTERED:none  DRAINS: none   LOCAL MEDICATIONS USED:  MARCAINE     SPECIMEN:  Source of Specimen:  right breast tissue with additional superior margin and sentinel node and targeted node  DISPOSITION OF SPECIMEN:  PATHOLOGY  COUNTS:  YES  TOURNIQUET:  * No tourniquets in log *  DICTATION: .Dragon Dictation   After informed consent was obtained the patient was brought to the operating room and placed in the supine position on the operating table.  After adequate induction of general anesthesia the patient's right chest, breast, and axillary area were prepped with ChloraPrep, allowed to dry, and draped in usual sterile manner.  An appropriate timeout was performed.  Previously an I-125 seed was placed in the right axilla to mark an area of positive lymph node.  This was the only abnormal lymph node seen on imaging.  The patient did have a palpable cancer in the upper outer right breast.  Since the breast cancer was so far in the upper outer quadrant we focused our attention first on the breast.  An elliptical incision was made in the skin overlying the palpable mass in the upper outer quadrant.  The incision was carried through the skin and subcutaneous tissue sharply with electrocautery.  The dissection was then carried around the palpable mass in all the way down to the muscle of the chest wall.  Once the specimen was removed it was oriented with the appropriate pink colors.  A specimen radiograph was obtained  that showed the clip in the specimen slightly closer to the superior margin.  Because of this an additional superior margin was removed sharply with electrocautery and marked appropriately.  This tissue was all then sent to pathology for further evaluation.  The neoprobe was set to I-125 in the area of radioactivity in the right axilla was readily identified through the current lumpectomy cavity.  The dissection was then carried sharply with electrocautery into the deep right axillary space under the direction of the neoprobe.  I was able to identify the lymph node with the radioactive seed and this was excised sharply with electrocautery and the lymphatics were controlled with clips.  A specimen radiograph on this node was obtained that showed the clip and the seed.  This was sent to pathology as targeted node.  The neoprobe was then set to technetium in the right axilla was examined.  There was an additional palpable node in the vicinity that was also excised sharply with the electrocautery and the lymphatics were controlled with clips.  This was sent as sentinel node #1.  No other hot or palpable lymph nodes were identified in the right axilla.  The area was examined and found to be hemostatic.  The axilla was then closed with interrupted 3-0 Vicryl stitches.  The lumpectomy cavity was irrigated with saline and infiltrated with quarter percent Marcaine.  The cavity was marked with clips.  The deep layer of the wound was then closed with layers of interrupted 3-0 Vicryl  stitches.  The skin was then closed with a running 4-0 Monocryl subcuticular stitch.  Dermabond dressings were applied.  The patient tolerated the procedure well.  At the end of the case all needle sponge and instrument counts were correct.  The patient was then awakened and taken to recovery in stable condition.  PLAN OF CARE: Discharge to home after PACU  PATIENT DISPOSITION:  PACU - hemodynamically stable.   Delay start of Pharmacological  VTE agent (>24hrs) due to surgical blood loss or risk of bleeding: not applicable

## 2017-11-26 NOTE — Anesthesia Procedure Notes (Signed)
Procedure Name: LMA Insertion Date/Time: 11/26/2017 9:37 AM Performed by: Signe Colt, CRNA Pre-anesthesia Checklist: Patient identified, Emergency Drugs available, Suction available and Patient being monitored Patient Re-evaluated:Patient Re-evaluated prior to induction Oxygen Delivery Method: Circle system utilized Preoxygenation: Pre-oxygenation with 100% oxygen Induction Type: IV induction Ventilation: Mask ventilation without difficulty LMA: LMA inserted LMA Size: 4.0 Number of attempts: 1 Airway Equipment and Method: Bite block Placement Confirmation: positive ETCO2 Tube secured with: Tape Dental Injury: Teeth and Oropharynx as per pre-operative assessment

## 2017-11-26 NOTE — Interval H&P Note (Signed)
History and Physical Interval Note:  11/26/2017 9:13 AM  [name]  has presented today for surgery, with the diagnosis of right breast cancer  The various methods of treatment have been discussed with the patient and family. After consideration of risks, benefits and other options for treatment, the patient has consented to  Procedure(s): RIGHT BREAST LUMPECTOMY WITH SENTINEL NODE MAPPING AND TARGETED NODE DISECTION ERAS PATHWAY (Right) as a surgical intervention .  The patient's history has been reviewed, patient examined, no change in status, stable for surgery.  I have reviewed the patient's chart and labs.  Questions were answered to the patient's satisfaction.     Autumn Messing III

## 2017-11-26 NOTE — Progress Notes (Signed)
Assisted Dr. Singer with right, ultrasound guided, pectoralis block. Side rails up, monitors on throughout procedure. See vital signs in flow sheet. Tolerated Procedure well. °

## 2017-11-26 NOTE — Anesthesia Preprocedure Evaluation (Signed)
Anesthesia Evaluation  Patient identified by MRN, date of birth, ID band Patient awake    Reviewed: Allergy & Precautions, NPO status , Patient's Chart, lab work & pertinent test results  History of Anesthesia Complications (+) PONV and history of anesthetic complications  Airway Mallampati: II  TM Distance: >3 FB Neck ROM: Full    Dental no notable dental hx. (+) Dental Advisory Given   Pulmonary neg pulmonary ROS,    Pulmonary exam normal        Cardiovascular negative cardio ROS Normal cardiovascular exam     Neuro/Psych negative neurological ROS  negative psych ROS   GI/Hepatic Neg liver ROS, GERD  ,  Endo/Other  negative endocrine ROS  Renal/GU negative Renal ROS  negative genitourinary   Musculoskeletal negative musculoskeletal ROS (+)   Abdominal   Peds negative pediatric ROS (+)  Hematology negative hematology ROS (+)   Anesthesia Other Findings   Reproductive/Obstetrics negative OB ROS                             Anesthesia Physical Anesthesia Plan  ASA: II  Anesthesia Plan: General   Post-op Pain Management:    Induction: Intravenous  PONV Risk Score and Plan: 4 or greater and Ondansetron, Dexamethasone, Scopolamine patch - Pre-op and Diphenhydramine  Airway Management Planned: LMA  Additional Equipment:   Intra-op Plan:   Post-operative Plan: Extubation in OR  Informed Consent: I have reviewed the patients History and Physical, chart, labs and discussed the procedure including the risks, benefits and alternatives for the proposed anesthesia with the patient or authorized representative who has indicated his/her understanding and acceptance.   Dental advisory given  Plan Discussed with: CRNA, Anesthesiologist and Surgeon  Anesthesia Plan Comments:         Anesthesia Quick Evaluation

## 2017-11-26 NOTE — Anesthesia Procedure Notes (Signed)
Anesthesia Regional Block: Pectoralis block   Pre-Anesthetic Checklist: ,, timeout performed, Correct Patient, Correct Site, Correct Laterality, Correct Procedure, Correct Position, site marked, Risks and benefits discussed,  Surgical consent,  Pre-op evaluation,  At surgeon's request and post-op pain management  Laterality: Right  Prep: chloraprep       Needles:  Injection technique: Single-shot  Needle Type: Echogenic Stimulator Needle     Needle Length: 10cm  Needle Gauge: 21     Additional Needles:   Narrative:  Start time: 11/26/2017 9:11 AM End time: 11/26/2017 9:21 AM Injection made incrementally with aspirations every 5 mL.  Performed by: Personally

## 2017-11-26 NOTE — Discharge Instructions (Signed)
No Tylenol until 2:15pm    Post Anesthesia Home Care Instructions  Activity: Get plenty of rest for the remainder of the day. A responsible individual must stay with you for 24 hours following the procedure.  For the next 24 hours, DO NOT: -Drive a car -Paediatric nurse -Drink alcoholic beverages -Take any medication unless instructed by your physician -Make any legal decisions or sign important papers.  Meals: Start with liquid foods such as gelatin or soup. Progress to regular foods as tolerated. Avoid greasy, spicy, heavy foods. If nausea and/or vomiting occur, drink only clear liquids until the nausea and/or vomiting subsides. Call your physician if vomiting continues.  Special Instructions/Symptoms: Your throat may feel dry or sore from the anesthesia or the breathing tube placed in your throat during surgery. If this causes discomfort, gargle with warm salt water. The discomfort should disappear within 24 hours.  If you had a scopolamine patch placed behind your ear for the management of post- operative nausea and/or vomiting:  1. The medication in the patch is effective for 72 hours, after which it should be removed.  Wrap patch in a tissue and discard in the trash. Wash hands thoroughly with soap and water. 2. You may remove the patch earlier than 72 hours if you experience unpleasant side effects which may include dry mouth, dizziness or visual disturbances. 3. Avoid touching the patch. Wash your hands with soap and water after contact with the patch.

## 2017-11-26 NOTE — H&P (Signed)
Phillips Theresa Hale  Location: [facility] Patient #: [id number] DOB: November 23, 1955 Married / Language: English / Race: White Female   History of Present Illness  The patient is a 62 year old female who presents with breast cancer. We are asked to see the patient in consultation by Dr. Emmit Pomfret to evaluate her for a new right breast cancer. The patient is a 62 year old white female who presents with a palpable mass in the UOQ of the right breast. She just noticed this. She had a mammogram in late June that did not see the cancer. The u/s can see it and it measured 1.9cm. The u/s also showed 1 abnormal looking lymph node that was biopsied and was positive. The cancer is ER and PR + and Her 2 neg with a Ki67 of 2%. The markers on the lymph node are similar but not the same. She states she is leaving next week for a 2 week trip to [place]   Past Surgical History  Breast Biopsy  Right. multiple Bypass Surgery for Poor Blood Flow to Legs  Cesarean Section - Multiple  Gallbladder Surgery - Laparoscopic  Hysterectomy (not due to cancer) - Complete  Knee Surgery  Bilateral. Oral Surgery  Spinal Surgery - Neck   Diagnostic Studies History Mammogram  within last year Pap Smear  1-5 years ago  Allergies Penicillin G Procaine *PENICILLINS*  Sulfa 10 *OPHTHALMIC AGENTS*  Niacin *VITAMINS*   Medication History  Allegra (Oral) Specific strength unknown - Active. Airborne (Oral) Specific strength unknown - Active. Medications Reconciled  Social History  Caffeine use  Tea. No alcohol use  No drug use  Tobacco use  Never smoker.  Family History  Breast Cancer  Mother, Sister. Heart disease in female family member before age 55   Pregnancy / Birth History  Age at menarche  4 years. Age of menopause  [age] Gravida  3 Length (months) of breastfeeding  7-12 Maternal age  54-30 Para  3  Other Problems  Breast Cancer  Gastroesophageal Reflux Disease   Heart murmur  Lump In Breast  Oophorectomy     Review of Systems  General Not Present- Appetite Loss, Chills, Fatigue, Fever, Night Sweats, Weight Gain and Weight Loss. Skin Not Present- Change in Wart/Mole, Dryness, Hives, Jaundice, New Lesions, Non-Healing Wounds, Rash and Ulcer. HEENT Present- Wears glasses/contact lenses. Not Present- Earache, Hearing Loss, Hoarseness, Nose Bleed, Oral Ulcers, Ringing in the Ears, Seasonal Allergies, Sinus Pain, Sore Throat, Visual Disturbances and Yellow Eyes. Respiratory Not Present- Bloody sputum, Chronic Cough, Difficulty Breathing, Snoring and Wheezing. Breast Present- Breast Mass. Not Present- Breast Pain, Nipple Discharge and Skin Changes. Cardiovascular Present- Rapid Heart Rate. Not Present- Chest Pain, Difficulty Breathing Lying Down, Leg Cramps, Palpitations, Shortness of Breath and Swelling of Extremities. Gastrointestinal Not Present- Abdominal Pain, Bloating, Bloody Stool, Change in Bowel Habits, Chronic diarrhea, Constipation, Difficulty Swallowing, Excessive gas, Gets full quickly at meals, Hemorrhoids, Indigestion, Nausea, Rectal Pain and Vomiting. Female Genitourinary Not Present- Frequency, Nocturia, Painful Urination, Pelvic Pain and Urgency. Musculoskeletal Not Present- Back Pain, Joint Pain, Joint Stiffness, Muscle Pain, Muscle Weakness and Swelling of Extremities. Neurological Not Present- Decreased Memory, Fainting, Headaches, Numbness, Seizures, Tingling, Tremor, Trouble walking and Weakness. Psychiatric Not Present- Anxiety, Bipolar, Change in Sleep Pattern, Depression, Fearful and Frequent crying. Endocrine Not Present- Cold Intolerance, Excessive Hunger, Hair Changes, Heat Intolerance, Hot flashes and New Diabetes. Hematology Not Present- Blood Thinners, Easy Bruising, Excessive bleeding, Gland problems, HIV and Persistent Infections.  Vitals  Weight: 130.38 lb Height: 65in Body Surface Area: 1.65 m Body Mass Index:  21.7 kg/m  BP: 138/86 (Sitting, Left Arm, Standard)       Physical Exam  General Mental Status-Alert. General Appearance-Consistent with stated age. Hydration-Well hydrated. Voice-Normal.  Head and Neck Head-normocephalic, atraumatic with no lesions or palpable masses. Trachea-midline. Thyroid Gland Characteristics - normal size and consistency.  Eye Eyeball - Bilateral-Extraocular movements intact. Sclera/Conjunctiva - Bilateral-No scleral icterus.  Chest and Lung Exam Chest and lung exam reveals -quiet, even and easy respiratory effort with no use of accessory muscles and on auscultation, normal breath sounds, no adventitious sounds and normal vocal resonance. Inspection Chest Wall - Normal. Back - normal.  Breast Note: there is a 2cm palpable mass in the UOQ of the right breast. There is no palpable mass in the left breast. There is no palpable axillary, supraclavicular, or cervical lymphadenopathy   Cardiovascular Cardiovascular examination reveals -normal heart sounds, regular rate and rhythm with no murmurs and normal pedal pulses bilaterally.  Abdomen Inspection Inspection of the abdomen reveals - No Hernias. Skin - Scar - no surgical scars. Palpation/Percussion Palpation and Percussion of the abdomen reveal - Soft, Non Tender, No Rebound tenderness, No Rigidity (guarding) and No hepatosplenomegaly. Auscultation Auscultation of the abdomen reveals - Bowel sounds normal.  Neurologic Neurologic evaluation reveals -alert and oriented x 3 with no impairment of recent or remote memory. Mental Status-Normal.  Musculoskeletal Normal Exam - Left-Upper Extremity Strength Normal and Lower Extremity Strength Normal. Normal Exam - Right-Upper Extremity Strength Normal and Lower Extremity Strength Normal.  Lymphatic Head & Neck  General Head & Neck Lymphatics: Bilateral - Description - Normal. Axillary  General Axillary Region:  Bilateral - Description - Normal. Tenderness - Non Tender. Femoral & Inguinal  Generalized Femoral & Inguinal Lymphatics: Bilateral - Description - Normal. Tenderness - Non Tender.     MALIGNANT NEOPLASM OF UPPER-OUTER QUADRANT OF RIGHT BREAST IN FEMALE, ESTROGEN RECEPTOR POSITIVE (C50.411) Impression: The patient appears to have a 1.9cm cancer in the UOQ of the right breast with a positive lymph node. I have talked to her about the different options for surgical treatment. Given her positive lymph node I would like her to talk to medical oncology about neoadjuvant treatment in which case if the node responded she may be a candidate for targeted node dissection rather than full axillary lymph node dissection. She will also need and MRI since the cancer was not seen on recent 3D mammography. I will call her with these results and we will complete our treatment plan and move forward Current Plans Referred to Oncology, for evaluation and follow up (Oncology). Routine.

## 2017-11-26 NOTE — Transfer of Care (Signed)
Immediate Anesthesia Transfer of Care Note  Patient: Theresa Hale  Procedure(s) Performed: RIGHT BREAST LUMPECTOMY WITH SENTINEL NODE MAPPING AND TARGETED NODE DISECTION ERAS PATHWAY (Right Breast)  Patient Location: PACU  Anesthesia Type:GA combined with regional for post-op pain  Level of Consciousness: drowsy and patient cooperative  Airway & Oxygen Therapy: Patient Spontanous Breathing and Patient connected to face mask oxygen  Post-op Assessment: Report given to RN and Post -op Vital signs reviewed and stable  Post vital signs: Reviewed and stable  Last Vitals:  Vitals Value Taken Time  BP    Temp    Pulse 63 11/26/2017 10:42 AM  Resp 13 11/26/2017 10:42 AM  SpO2 100 % 11/26/2017 10:42 AM  Vitals shown include unvalidated device data.  Last Pain:  Vitals:   11/26/17 0807  TempSrc: Oral  PainSc: 0-No pain         Complications: No apparent anesthesia complications

## 2017-11-27 ENCOUNTER — Encounter (HOSPITAL_BASED_OUTPATIENT_CLINIC_OR_DEPARTMENT_OTHER): Payer: Self-pay | Admitting: General Surgery

## 2017-12-03 ENCOUNTER — Inpatient Hospital Stay: Payer: BC Managed Care – PPO | Attending: Hematology and Oncology | Admitting: Hematology and Oncology

## 2017-12-03 DIAGNOSIS — Z170 Estrogen receptor positive status [ER+]: Secondary | ICD-10-CM

## 2017-12-03 DIAGNOSIS — C50411 Malignant neoplasm of upper-outer quadrant of right female breast: Secondary | ICD-10-CM | POA: Diagnosis not present

## 2017-12-03 NOTE — Assessment & Plan Note (Signed)
11/26/2017 right lumpectomy: Grade 2 IDC 1.9 cm, margins negative, 1/2 lymph nodes positive, ER 60%, PR 40%, HER-2 2+ by IHC negative by FISH ratio 1.35, copy #2.1, Ki-67 2%, T1cN1a stage IA  Pathology counseling: I discussed the final pathology report of the patient provided  a copy of this report. I discussed the margins as well as lymph node surgeries. We also discussed the final staging along with previously performed ER/PR and HER-2/neu testing.  Recommendation: 1.  Mammaprint testing to determine if she would benefit from chemotherapy 2. adjuvant radiation therapy 3.  Followed by adjuvant antiestrogen therapy and consideration for clinical trial participation in [place]  Return to clinic based upon Mammaprint test results

## 2017-12-03 NOTE — Progress Notes (Signed)
Patient Care Team: Caryl Bis, MD as PCP - General (Family Medicine)  DIAGNOSIS:  Encounter Diagnosis  Name Primary?  . Malignant neoplasm of upper-outer quadrant of right breast in female, estrogen receptor positive ([place])     SUMMARY OF ONCOLOGIC HISTORY:   Malignant neoplasm of upper-outer quadrant of right breast in female, estrogen receptor positive ([place])   10/09/2017 Initial Diagnosis    Palpable lump in the right breast with a history of right breast atypia in mother and sister with breast cancers, 1.9 cm mass at 10 o'clock position right breast biopsy 10 o'clock position 5 cm from nipple: Grade 1 invasive ductal carcinoma ER 60%, PR 40%, Ki-67 2%, HER-2 negative ratio 1.35 T1cN1a stage Ib AJCC 8    11/26/2017 Surgery    Right lumpectomy: Grade 2 IDC 1.9 cm, margins negative, 1/2 lymph nodes positive, ER 60%, PR 40%, HER-2 2+ by IHC negative by FISH ratio 1.35, copy #2.1, Ki-67 2%, T1cN1a stage IA     12/03/2017 Cancer Staging    Staging form: Breast, AJCC 8th Edition - Pathologic: Stage IA (pT1c, pN1a, cM0, G2, ER+, PR+, HER2-) - Signed by Nicholas Lose, MD on 12/03/2017     CHIEF COMPLIANT: Follow-up after recent right lumpectomy  INTERVAL HISTORY: Theresa Hale is a 62 year old with above-mentioned history of right breast cancer underwent lumpectomy and is here today to discuss the pathology report.  She is healing very well from recent surgery.  She has significant discomfort in the axilla but it is getting better with time.  REVIEW OF SYSTEMS:   Constitutional: Denies fevers, chills or abnormal weight loss Eyes: Denies blurriness of vision Ears, nose, mouth, throat, and face: Denies mucositis or sore throat Respiratory: Denies cough, dyspnea or wheezes Cardiovascular: Denies palpitation, chest discomfort Gastrointestinal:  Denies nausea, heartburn or change in bowel habits Skin: Denies abnormal skin rashes Lymphatics: Denies new lymphadenopathy or easy  bruising Neurological:Denies numbness, tingling or new weaknesses Behavioral/Psych: Mood is stable, no new changes  Extremities: No lower extremity edema Breast: Recent right lumpectomy with significant pain and discomfort All other systems were reviewed with the patient and are negative.  I have reviewed the past medical history, past surgical history, social history and family history with the patient and they are unchanged from previous note.  ALLERGIES:  is allergic to penicillins and sulfa antibiotics.  MEDICATIONS:  Current Outpatient Medications  Medication Sig Dispense Refill  . HYDROcodone-acetaminophen (NORCO/VICODIN) 5-325 MG tablet Take 1-2 tablets by mouth every 6 (six) hours as needed for moderate pain or severe pain. 15 tablet 0  . metoprolol succinate (TOPROL XL) 25 MG 24 hr tablet Take 25 mg by mouth daily as needed.      No current facility-administered medications for this visit.     PHYSICAL EXAMINATION: ECOG PERFORMANCE STATUS: 1 - Symptomatic but completely ambulatory  Vitals:   12/03/17 1029  BP: (!) 148/73  Pulse: 64  Resp: 16  Temp: 97.7 F (36.5 C)  SpO2: 100%   Filed Weights   12/03/17 1029  Weight: 126 lb 14.4 oz (57.6 kg)    GENERAL:alert, no distress and comfortable SKIN: skin color, texture, turgor are normal, no rashes or significant lesions EYES: normal, Conjunctiva are pink and non-injected, sclera clear OROPHARYNX:no exudate, no erythema and lips, buccal mucosa, and tongue normal  NECK: supple, thyroid normal size, non-tender, without nodularity LYMPH:  no palpable lymphadenopathy in the cervical, axillary or inguinal LUNGS: clear to auscultation and percussion with normal breathing effort HEART:  regular rate & rhythm and no murmurs and no lower extremity edema ABDOMEN:abdomen soft, non-tender and normal bowel sounds MUSCULOSKELETAL:no cyanosis of digits and no clubbing  NEURO: alert & oriented x 3 with fluent speech, no focal  motor/sensory deficits EXTREMITIES: No lower extremity edema   ASSESSMENT & PLAN:  Malignant neoplasm of upper-outer quadrant of right breast in female, estrogen receptor positive ([place]) 11/26/2017 right lumpectomy: Grade 2 IDC 1.9 cm, margins negative, 1/2 lymph nodes positive, ER 60%, PR 40%, HER-2 2+ by IHC negative by FISH ratio 1.35, copy #2.1, Ki-67 2%, T1cN1a stage IA Patient had Mammaprint prior to surgery and it was low risk luminal type a  Pathology counseling: I discussed the final pathology report of the patient provided  a copy of this report. I discussed the margins as well as lymph node surgeries. We also discussed the final staging along with previously performed ER/PR and HER-2/neu testing.  Recommendation: 1.  adjuvant radiation therapy at [facility] 2.  Patient took neoadjuvant letrozole and could not tolerate it.  She had extraordinary amount of side effects even at half a dose that included profound fatigue, mental clouding, irritability, loss of appetite etc. She is not keen on taking any antiestrogen therapy. We will discuss this again after she finishes with radiation.  The provided me with paperwork to suggest that the Mammaprint is being rejected by her insurance. We will look into this matter.   No orders of the defined types were placed in this encounter.  The patient has a good understanding of the overall plan. she agrees with it. she will call with any problems that may develop before the next visit here.   Harriette Ohara, MD 12/03/17

## 2018-01-04 ENCOUNTER — Telehealth: Payer: Self-pay | Admitting: Hematology and Oncology

## 2018-01-04 NOTE — Telephone Encounter (Signed)
Tried to reach regarding voicemail, I was not able to leave a message about change

## 2018-01-18 ENCOUNTER — Ambulatory Visit: Payer: BC Managed Care – PPO | Admitting: Hematology and Oncology

## 2018-02-01 ENCOUNTER — Ambulatory Visit: Payer: BC Managed Care – PPO | Admitting: Hematology and Oncology

## 2018-02-13 DIAGNOSIS — Z923 Personal history of irradiation: Secondary | ICD-10-CM

## 2018-02-13 HISTORY — DX: Personal history of irradiation: Z92.3

## 2018-03-05 ENCOUNTER — Inpatient Hospital Stay: Payer: BC Managed Care – PPO | Attending: Hematology and Oncology | Admitting: Hematology and Oncology

## 2018-03-05 ENCOUNTER — Telehealth: Payer: Self-pay | Admitting: Hematology and Oncology

## 2018-03-05 DIAGNOSIS — Z170 Estrogen receptor positive status [ER+]: Secondary | ICD-10-CM | POA: Diagnosis not present

## 2018-03-05 DIAGNOSIS — C50411 Malignant neoplasm of upper-outer quadrant of right female breast: Secondary | ICD-10-CM

## 2018-03-05 DIAGNOSIS — Z1231 Encounter for screening mammogram for malignant neoplasm of breast: Secondary | ICD-10-CM

## 2018-03-05 DIAGNOSIS — Z923 Personal history of irradiation: Secondary | ICD-10-CM | POA: Diagnosis not present

## 2018-03-05 NOTE — Telephone Encounter (Signed)
Gave avs and calendar ° °

## 2018-03-05 NOTE — Progress Notes (Signed)
Patient Care Team: Caryl Bis, MD as PCP - General (Family Medicine)  DIAGNOSIS:    ICD-10-CM   1. Malignant neoplasm of upper-outer quadrant of right breast in female, estrogen receptor positive ([place]) C50.411 MM DIAG BREAST TOMO BILATERAL   Z17.0   2. Encounter for screening mammogram for malignant neoplasm of breast Z12.31 MR BREAST BILATERAL W WO CONTRAST INC CAD    SUMMARY OF ONCOLOGIC HISTORY:   Malignant neoplasm of upper-outer quadrant of right breast in female, estrogen receptor positive ([place])   10/09/2017 Initial Diagnosis    Palpable lump in the right breast with a history of right breast atypia in mother and sister with breast cancers, 1.9 cm mass at 10 o'clock position right breast biopsy 10 o'clock position 5 cm from nipple: Grade 1 invasive ductal carcinoma ER 60%, PR 40%, Ki-67 2%, HER-2 negative ratio 1.35 T1cN1a stage Ib AJCC 8    11/26/2017 Surgery    Right lumpectomy: Grade 2 IDC 1.9 cm, margins negative, 1/2 lymph nodes positive, ER 60%, PR 40%, HER-2 2+ by IHC negative by FISH ratio 1.35, copy #2.1, Ki-67 2%, T1cN1a stage IA     12/03/2017 Cancer Staging    Staging form: Breast, AJCC 8th Edition - Pathologic: Stage IA (pT1c, pN1a, cM0, G2, ER+, PR+, HER2-) - Signed by Nicholas Lose, MD on 12/03/2017    01/17/2018 - 02/25/2018 Radiation Therapy    Adj XRT at [facility] COMPLIANT: Follow-up after radiation  INTERVAL HISTORY: Theresa Hale is a 63 y.o. with above-mentioned history of right breast cancer who underwent a lumpectomy and adjuvant radiation and was unable to tolerate letrozole. She presents to the clinic today with her husband. She finished radiation on 02/25/18 and has radiation dermatitis that is improving. She still does not want to be on letrozole therapy and will follow-up with mammograms and breast MRIs alternating every six months at the breast center. She reviewed her medication list with me.   REVIEW OF SYSTEMS:   Constitutional:  Denies fevers, chills or abnormal weight loss Eyes: Denies blurriness of vision Ears, nose, mouth, throat, and face: Denies mucositis or sore throat Respiratory: Denies cough, dyspnea or wheezes Cardiovascular: Denies palpitation, chest discomfort Gastrointestinal:  Denies nausea, heartburn or change in bowel habits Skin: (+) radiation dermatitis  Lymphatics: Denies new lymphadenopathy or easy bruising Neurological: Denies numbness, tingling or new weaknesses Behavioral/Psych: Mood is stable, no new changes  Extremities: No lower extremity edema Breast: Significant radiation dermatitis from front to the back All other systems were reviewed with the patient and are negative.  I have reviewed the past medical history, past surgical history, social history and family history with the patient and they are unchanged from previous note.  ALLERGIES:  is allergic to penicillins and sulfa antibiotics.  MEDICATIONS:  No current outpatient medications on file.   No current facility-administered medications for this visit.     PHYSICAL EXAMINATION: ECOG PERFORMANCE STATUS: 2 - Symptomatic, <50% confined to bed  Vitals:   03/05/18 1128  BP: (!) 145/61  Pulse: 65  Resp: 18  Temp: 97.9 F (36.6 C)  SpO2: 100%   Filed Weights   03/05/18 1128  Weight: 126 lb 1.6 oz (57.2 kg)    GENERAL: alert, no distress and comfortable SKIN: skin color, texture, turgor are normal, no rashes or significant lesions EYES: normal, Conjunctiva are pink and non-injected, sclera clear OROPHARYNX: no exudate, no erythema and lips, buccal mucosa, and tongue normal  NECK: supple,  thyroid normal size, non-tender, without nodularity LYMPH: no palpable lymphadenopathy in the cervical, axillary or inguinal LUNGS: clear to auscultation and percussion with normal breathing effort HEART: regular rate & rhythm and no murmurs and no lower extremity edema ABDOMEN: abdomen soft, non-tender and normal bowel  sounds MUSCULOSKELETAL: no cyanosis of digits and no clubbing  NEURO: alert & oriented x 3 with fluent speech, no focal motor/sensory deficits EXTREMITIES: No lower extremity edema   ASSESSMENT & PLAN:  Malignant neoplasm of upper-outer quadrant of right breast in female, estrogen receptor positive ([name]) 11/26/2017 right lumpectomy: Grade 2 IDC 1.9 cm, margins negative, 1/2 lymph nodes positive, ER 60%, PR 40%, HER-2 2+ by IHC negative by FISH ratio 1.35, copy #2.1, Ki-67 2%, T1cN1a stage IA Patient had Mammaprint prior to surgery and it was low risk luminal type A Adjuvant radiation therapy at [facility] completed 02/25/2018  Antiestrogen therapy: Patient took neoadjuvant letrozole and could not tolerate it.  She does not want to take any antiestrogen therapy in spite of my strong recommendation.  I discussed the pros and cons of tamoxifen today.  She does not want to take any antiestrogen treatments.  Breast cancer surveillance: 1.  Mammogram to be scheduled July 2020 2.  Annual breast exams 3.  Breast MRI to be done in December 2020.  We may do breast MRIs for the first couple of years and then we can decide if she needs every other year breast MRI.  Return to clinic in 1 year for follow-up     Orders Placed This Encounter  Procedures  . MM DIAG BREAST TOMO BILATERAL    Previously was [facility]. Wants to switch.    Standing Status:   Future    Standing Expiration Date:   03/06/2019    Order Specific Question:   Reason for Exam (SYMPTOM  OR DIAGNOSIS REQUIRED)    Answer:   Annual diagnostic mammograms with h/o breast cancer    Order Specific Question:   Preferred imaging location?    Answer:   [facility]  . MR BREAST BILATERAL W WO CONTRAST INC CAD    Standing Status:   Future    Standing Expiration Date:   05/04/2019    Order Specific Question:   ** REASON FOR EXAM (FREE TEXT)    Answer:   Breast cancer not detected by mammograms. Annual MRI high risk    Order Specific Question:    If indicated for the ordered procedure, I authorize the administration of contrast media per Radiology protocol    Answer:   Yes    Order Specific Question:   What is the patient's sedation requirement?    Answer:   No Sedation    Order Specific Question:   Does the patient have a pacemaker or implanted devices?    Answer:   No    Order Specific Question:   Radiology Contrast Protocol - do NOT remove file path    Answer:   [email or web address]    Order Specific Question:   Preferred imaging location?    Answer:   [facility] (table limit-550lbs)   The patient has a good understanding of the overall plan. she agrees with it. she will call with any problems that may develop before the next visit here.  Nicholas Lose, MD 03/05/2018  Julious Oka Dorshimer am acting as scribe for Dr. Nicholas Lose.  I have reviewed the above documentation for accuracy and completeness, and I agree with the above.

## 2018-03-05 NOTE — Assessment & Plan Note (Signed)
11/26/2017 right lumpectomy: Grade 2 IDC 1.9 cm, margins negative, 1/2 lymph nodes positive, ER 60%, PR 40%, HER-2 2+ by IHC negative by FISH ratio 1.35, copy #2.1, Ki-67 2%, T1cN1a stage IA Patient had Mammaprint prior to surgery and it was low risk luminal type A Adjuvant radiation therapy at [facility]  Antiestrogen therapy: Patient took neoadjuvant letrozole and could not tolerate it.  She does not want to take any antiestrogen therapy in spite of my strong recommendation.  I discussed the pros and cons of tamoxifen today.  Breast cancer surveillance: 1.  Mammogram to be scheduled for end of June 2020 2.  Annual breast exams  Return to clinic in 1 year for follow-up

## 2018-03-08 ENCOUNTER — Telehealth: Payer: Self-pay | Admitting: Adult Health

## 2018-03-08 NOTE — Telephone Encounter (Signed)
Scheduled appt per 1/22 sch message - sent reminder letter in the mail with appt date and time

## 2018-05-31 ENCOUNTER — Telehealth: Payer: Self-pay | Admitting: Adult Health

## 2018-05-31 NOTE — Telephone Encounter (Signed)
Rescheduled SCP appt and changed to [facility] per sch msg. Patient will be contacted

## 2018-06-05 ENCOUNTER — Telehealth: Payer: Self-pay | Admitting: Hematology and Oncology

## 2018-06-05 NOTE — Telephone Encounter (Signed)
Returning patient's phone call regarding cancelling an appointment, patient is unsure why she's scheduled for 05/18 and would like the appointment to be cancelled.

## 2018-06-06 ENCOUNTER — Telehealth: Payer: Self-pay | Admitting: Hematology and Oncology

## 2018-06-06 NOTE — Telephone Encounter (Signed)
I spoke with the patient about the survivorship appointment. Because she has full understanding of survivorship as she went through with her sister and other family members, she did not think she needs to come for that appointment. We will cancel that appointment. She informed me that she is not taking antiestrogen therapy. She wants to see me once a year which has been set up for January. She will get a mammogram and breast MRI and these orders have already been placed.

## 2018-06-06 NOTE — Telephone Encounter (Signed)
Per patient's request 05/21 appointment has been cancelled.

## 2018-07-01 ENCOUNTER — Ambulatory Visit: Payer: BC Managed Care – PPO | Admitting: Adult Health

## 2018-07-04 ENCOUNTER — Encounter: Payer: BC Managed Care – PPO | Admitting: Adult Health

## 2018-08-12 ENCOUNTER — Ambulatory Visit
Admission: RE | Admit: 2018-08-12 | Discharge: 2018-08-12 | Disposition: A | Discharge status: Home / Self Care | Payer: Medicare Other | Source: Ambulatory Visit | Attending: Hematology and Oncology | Admitting: Hematology and Oncology

## 2018-08-12 ENCOUNTER — Other Ambulatory Visit: Payer: Self-pay

## 2018-08-12 DIAGNOSIS — C50411 Malignant neoplasm of upper-outer quadrant of right female breast: Secondary | ICD-10-CM

## 2018-08-12 DIAGNOSIS — Z170 Estrogen receptor positive status [ER+]: Secondary | ICD-10-CM

## 2018-11-07 ENCOUNTER — Telehealth: Payer: Self-pay | Admitting: Cardiology

## 2018-11-07 NOTE — Telephone Encounter (Signed)
FYI.  °Contacted patient regarding recall appointment, patient notified our office they did not wish to keep this appointment at this time.  Deleted recall from system. °

## 2019-02-03 ENCOUNTER — Ambulatory Visit
Admission: RE | Admit: 2019-02-03 | Discharge: 2019-02-03 | Disposition: A | Discharge status: Home / Self Care | Payer: Medicare Other | Source: Ambulatory Visit | Attending: Hematology and Oncology | Admitting: Hematology and Oncology

## 2019-02-03 ENCOUNTER — Other Ambulatory Visit: Payer: Self-pay

## 2019-02-03 DIAGNOSIS — Z1231 Encounter for screening mammogram for malignant neoplasm of breast: Secondary | ICD-10-CM

## 2019-02-03 MED ORDER — GADOBUTROL 1 MMOL/ML IV SOLN
6.0000 mL | Freq: Once | INTRAVENOUS | Status: AC | PRN
  Administered 2019-02-03: 6 mL via INTRAVENOUS

## 2019-03-05 NOTE — Progress Notes (Signed)
Patient Care Team: Caryl Bis, MD as PCP - General (Family Medicine)  DIAGNOSIS:    ICD-10-CM   1. Malignant neoplasm of upper-outer quadrant of right breast in female, estrogen receptor positive ([place])  C50.411    Z17.0     SUMMARY OF ONCOLOGIC HISTORY: Oncology History  Malignant neoplasm of upper-outer quadrant of right breast in female, estrogen receptor positive ([place])  10/09/2017 Initial Diagnosis   Palpable lump in the right breast with a history of right breast atypia in mother and sister with breast cancers, 1.9 cm mass at 10 o'clock position right breast biopsy 10 o'clock position 5 cm from nipple: Grade 1 invasive ductal carcinoma ER 60%, PR 40%, Ki-67 2%, HER-2 negative ratio 1.35 T1cN1a stage Ib AJCC 8   11/26/2017 Surgery   Right lumpectomy: Grade 2 IDC 1.9 cm, margins negative, 1/2 lymph nodes positive, ER 60%, PR 40%, HER-2 2+ by IHC negative by FISH ratio 1.35, copy #2.1, Ki-67 2%, T1cN1a stage IA    12/03/2017 Cancer Staging   Staging form: Breast, AJCC 8th Edition - Pathologic: Stage IA (pT1c, pN1a, cM0, G2, ER+, PR+, HER2-) - Signed by Nicholas Lose, MD on 12/03/2017   01/17/2018 - 02/25/2018 Radiation Therapy   Adj XRT at [place] COMPLIANT: Follow-up of right breast cancer  INTERVAL HISTORY: Theresa Hale is a 64 y.o. with above-mentioned history of right breast cancer who underwent a lumpectomy, radiation, and was unable to tolerate letrozole. Mammogram on 08/12/18 showed no evidence of malignancy bilaterally. Breast MRI on 02/03/19 showed no evidence of malignancy bilaterally. She presents to the clinic today for follow-up.  She does have occasional fatigue. Denies any lumps nodules or any concerns in the breast.  ALLERGIES:  is allergic to penicillins and sulfa antibiotics.  MEDICATIONS:  No current outpatient medications on file.   No current facility-administered medications for this visit.    PHYSICAL EXAMINATION: ECOG PERFORMANCE  STATUS: 1 - Symptomatic but completely ambulatory  Vitals:   03/06/19 1112  BP: (!) 153/68  Pulse: 71  Resp: 18  Temp: 98.2 F (36.8 C)  SpO2: 99%   Filed Weights   03/06/19 1112  Weight: 121 lb 8 oz (55.1 kg)    BREAST: No palpable masses or nodules in either right or left breasts. No palpable axillary supraclavicular or infraclavicular adenopathy no breast tenderness or nipple discharge. (exam performed in the presence of a chaperone)  LABORATORY DATA:  I have reviewed the data as listed No flowsheet data found.  No results found for: WBC, HGB, HCT, MCV, PLT, NEUTROABS  ASSESSMENT & PLAN:  Malignant neoplasm of upper-outer quadrant of right breast in female, estrogen receptor positive ([place]) 10/14/2019right lumpectomy: Grade 2 IDC 1.9 cm, margins negative, 1/2 lymph nodes positive, ER 60%, PR 40%, HER-2 2+ by IHC negative by FISH ratio 1.35, copy #2.1, Ki-67 2%, T1cN1a stage IA Patient had Mammaprint prior to surgery and it was low risk luminal type A Adjuvant radiation therapy at [facility] completed 02/25/2018  Antiestrogen therapy: Patient took neoadjuvant letrozole and could not tolerate it.  She does not want to take any antiestrogen therapy in spite of my strong recommendation.  I discussed the pros and cons of tamoxifen today.  She does not want to take any antiestrogen treatments.  Breast cancer surveillance: 1.  Mammogram 08/12/2018: Benign breast density category D 2.  Annual breast exams:03/06/19: Benign 3.  Breast MRI 02/03/2019: No evidence of malignancy.   We will obtain another MRI  in 1 year.  We debated whether to get an MRI every other year after that.  Return to clinic in 1 year for follow-up  No orders of the defined types were placed in this encounter.  The patient has a good understanding of the overall plan. she agrees with it. she will call with any problems that may develop before the next visit here.  Total time spent: 15 mins including face to face  time and time spent for planning, charting and coordination of care  Nicholas Lose, MD 03/06/2019  I, Cloyde Reams Dorshimer, am acting as scribe for Dr. Nicholas Lose.  I have reviewed the above documentation for accuracy and completeness, and I agree with the above.

## 2019-03-06 ENCOUNTER — Other Ambulatory Visit: Payer: Self-pay

## 2019-03-06 ENCOUNTER — Inpatient Hospital Stay: Payer: Medicare PPO | Attending: Hematology and Oncology | Admitting: Hematology and Oncology

## 2019-03-06 DIAGNOSIS — Z170 Estrogen receptor positive status [ER+]: Secondary | ICD-10-CM

## 2019-03-06 DIAGNOSIS — Z853 Personal history of malignant neoplasm of breast: Secondary | ICD-10-CM | POA: Diagnosis present

## 2019-03-06 DIAGNOSIS — C50411 Malignant neoplasm of upper-outer quadrant of right female breast: Secondary | ICD-10-CM | POA: Diagnosis not present

## 2019-03-06 DIAGNOSIS — Z923 Personal history of irradiation: Secondary | ICD-10-CM | POA: Diagnosis not present

## 2019-03-06 NOTE — Assessment & Plan Note (Signed)
10/14/2019right lumpectomy: Grade 2 IDC 1.9 cm, margins negative, 1/2 lymph nodes positive, ER 60%, PR 40%, HER-2 2+ by IHC negative by FISH ratio 1.35, copy #2.1, Ki-67 2%, T1cN1a stage IA Patient had Mammaprint prior to surgery and it was low risk luminal type A Adjuvant radiation therapy at [facility] completed 02/25/2018  Antiestrogen therapy: Patient took neoadjuvant letrozole and could not tolerate it.  She does not want to take any antiestrogen therapy in spite of my strong recommendation.  I discussed the pros and cons of tamoxifen today.  She does not want to take any antiestrogen treatments.  Breast cancer surveillance: 1.  Mammogram to be scheduled July 2020 2.  Annual breast exams:03/06/19: Benign 3.  Breast MRI 02/03/2019: No evidence of malignancy.  We may do breast MRIs for the first couple of years and then we can decide if she needs every other year breast MRI.  Return to clinic in 1 year for follow-up

## 2019-03-07 ENCOUNTER — Telehealth: Payer: Self-pay | Admitting: Hematology and Oncology

## 2019-03-07 NOTE — Telephone Encounter (Signed)
I left a message regarding schedule  

## 2019-06-09 DIAGNOSIS — M069 Rheumatoid arthritis, unspecified: Secondary | ICD-10-CM | POA: Diagnosis not present

## 2019-06-09 DIAGNOSIS — R5383 Other fatigue: Secondary | ICD-10-CM | POA: Diagnosis not present

## 2019-06-09 DIAGNOSIS — K2100 Gastro-esophageal reflux disease with esophagitis, without bleeding: Secondary | ICD-10-CM | POA: Diagnosis not present

## 2019-06-09 DIAGNOSIS — E559 Vitamin D deficiency, unspecified: Secondary | ICD-10-CM | POA: Diagnosis not present

## 2019-06-30 DIAGNOSIS — R42 Dizziness and giddiness: Secondary | ICD-10-CM | POA: Diagnosis not present

## 2019-06-30 DIAGNOSIS — R002 Palpitations: Secondary | ICD-10-CM | POA: Diagnosis not present

## 2019-06-30 DIAGNOSIS — Z681 Body mass index (BMI) 19 or less, adult: Secondary | ICD-10-CM | POA: Diagnosis not present

## 2019-07-05 NOTE — Progress Notes (Signed)
Cardiology Office Note  Date:  07/07/2019   ID:  Theresa Hale, DOB [birth year], MRN [id number]  PCP:  Caryl Bis, MD   Chief Complaint  Patient presents with  . New Patient (Initial Visit)    Ref by Dr. Quillian Quince for tachycardia. Pt. has a Hx. with Dr. Ina Kick.  Meds reviewed by the pt. verbally. Pt. c/o lightheaded, shortness of breath, rapid heart beats and chest tightness at times.     HPI:  [name] is a 64 year old woman with past medical history of Breast cancer Atrial flutter in 2018 HTN Referred by Gar Ponto for tachycardia, hx of atrial flutter  Seen by Dr. Azucena Kuba, cardiology, in 05/2016 for atrial flutter Was started on metoprolol, asa  Echo 2018 reviewed Left ventricle: The cavity size was normal. Wall thickness was  normal. Systolic function was normal. The estimated ejection  fraction was in the range of 60% to 65%. Wall motion was normal;  there were no regional wall motion abnormalities. There was an  increased relative contribution of atrial contraction to  ventricular filling. Left ventricular diastolic function  parameters were normal.  - Aortic valve: Mildly calcified annulus. Probably trileaflet.  - Mitral valve: Mildly calcified annulus.  - Tricuspid valve: There was mild regurgitation.  On discussions today reports having paroxysmal tachycardia Comes on without rhyme or reason, sometimes with change in position More often when standing up Sometimes tachycardia associated with dizziness  Other times has dizziness with no tachycardia, etiology unclear but happens when she is standing Does not happen when she is sitting or lying down Presents couple times a month Last tachycardia: last Sunday, dizzy/lightheaded, tachycardia Does breathing exercises and symptoms resolved   Typically blood pressure runs low at home No orthostasis on a regular basis Orthostatics checked in the office today negative with pressure [id number] systolic  supine down to [id number] standing heart rate did increase 67 up to 97 Recovery in blood pressure after 3 minutes [id number] systolic heart rates 93  Previously on metoprolol, she is no longer on this Does not take aspirin Losing weight, 9 pounds Was told to cut out her carbohydrates  03/29/2019 Near syncope Went down on the ground, etiology unclear  Does exercise bike 25 min a day, no symptoms  EKG personally reviewed by myself on todays visit Shows sinus bradycardia rate 66 bpm no significant ST-T wave changes   PMH:   has a past medical history of Atrial flutter ([place]), GERD (gastroesophageal reflux disease), Personal history of radiation therapy (02/2018), and PONV (postoperative nausea and vomiting).  PSH:    Past Surgical History:  Procedure Laterality Date  . ABDOMINAL HYSTERECTOMY    . BREAST BIOPSY Right 10/02/2017  . BREAST EXCISIONAL BIOPSY Right 01/2002  . BREAST LUMPECTOMY Right 2019  . BREAST LUMPECTOMY WITH RADIOACTIVE SEED AND SENTINEL LYMPH NODE BIOPSY Right 11/26/2017   Procedure: RIGHT BREAST LUMPECTOMY WITH SENTINEL NODE MAPPING AND TARGETED NODE DISECTION ERAS PATHWAY;  Surgeon: Jovita Kussmaul, MD;  Location: [facility];  Service: General;  Laterality: Right;  . CESAREAN SECTION    . CHOLECYSTECTOMY    . KNEE ARTHROSCOPY    . NECK SURGERY      Current Outpatient Medications  Medication Sig Dispense Refill  . Cholecalciferol (DIALYVITE VITAMIN D 5000 PO) Take 5,000 Units by mouth daily.    Marland Kitchen esomeprazole (NEXIUM) 40 MG capsule Take 40 mg by mouth daily at 12 noon.     . fexofenadine (ALLEGRA ALLERGY) 180 MG  tablet Take 180 mg by mouth daily.      No current facility-administered medications for this visit.     Allergies:   Penicillins, Erythromycin, Sulfa antibiotics, Other, and Sulfasalazine   Social History:  The patient  reports that she has never smoked. She has never used smokeless tobacco. She reports that she does not drink alcohol or use  drugs.   Family History:   family history includes Alzheimer's disease in her father; Breast cancer (age of onset: 83) in her sister; Breast cancer (age of onset: 48) in her mother; Parkinson's disease in her father; Stroke in her mother.    Review of Systems: Review of Systems  Constitutional: Negative.   HENT: Negative.   Respiratory: Negative.   Cardiovascular: Positive for palpitations.       Tachycardia  Gastrointestinal: Negative.   Musculoskeletal: Negative.   Neurological: Positive for dizziness.  Psychiatric/Behavioral: Negative.   All other systems reviewed and are negative.   PHYSICAL EXAM: VS:  BP 140/88 (BP Location: Right Arm, Patient Position: Sitting, Cuff Size: Normal)   Pulse 66   Ht 5\' 5"  (1.651 m)   Wt 117 lb 6 oz (53.2 kg)   SpO2 99%   BMI 19.53 kg/m  , BMI Body mass index is 19.53 kg/m. GEN: Well nourished, well developed, in no acute distress HEENT: normal Neck: no JVD, carotid bruits, or masses Cardiac: RRR; no murmurs, rubs, or gallops,no edema  Respiratory:  clear to auscultation bilaterally, normal work of breathing GI: soft, nontender, nondistended, + BS MS: no deformity or atrophy Skin: warm and dry, no rash Neuro:  Strength and sensation are intact Psych: euthymic mood, full affect   Recent Labs: No results found for requested labs within last 8760 hours.    Lipid Panel No results found for: CHOL, HDL, LDLCALC, TRIG    Wt Readings from Last 3 Encounters:  07/07/19 117 lb 6 oz (53.2 kg)  03/06/19 121 lb 8 oz (55.1 kg)  03/05/18 126 lb 1.6 oz (57.2 kg)     ASSESSMENT AND PLAN:  Problem List Items Addressed This Visit    Malignant neoplasm of upper-outer quadrant of right breast in female, estrogen receptor positive ([place])    Other Visit Diagnoses    Typical atrial flutter ([place])    -  Primary   Relevant Orders   EKG 12-Lead   LONG TERM MONITOR (3-14 DAYS)   CT CARDIAC SCORING   Benign essential HTN         Etiology of the  paroxysmal tachycardia unclear Discussed atrial tachycardia, flutter or other arrhythmia We will not add medications at this time We have ordered a ZIO monitor for further evaluation  Dizziness Recommended when she has episodes that she check her blood pressure, and consider checking orthostatics at home Typically has dizziness after she has been standing for some time, not typically when she goes to stand which would be more consistent with orthostasis Blood pressure measurements she provided today from a sitting position are low at times in the 90s other [id number] systolic During low blood pressures did not have dizziness  Preventive care Concerned about potential for blockages  discussed CT coronary calcium scoring At her request calcium score has been ordered locally  Disposition: Recommend she call us with blood pressure measurements when dizzy We will call her with the results of her ZIO monitor    Total encounter time more than 45 minutes  Greater than 50% was spent in counseling and coordination of care  with the patient    Signed, Esmond Plants, M.D., Ph.D. [place], [place]

## 2019-07-07 ENCOUNTER — Ambulatory Visit (INDEPENDENT_AMBULATORY_CARE_PROVIDER_SITE_OTHER): Payer: Medicare PPO

## 2019-07-07 ENCOUNTER — Encounter: Payer: Self-pay | Admitting: Cardiovascular Disease

## 2019-07-07 ENCOUNTER — Other Ambulatory Visit: Payer: Self-pay

## 2019-07-07 ENCOUNTER — Ambulatory Visit (INDEPENDENT_AMBULATORY_CARE_PROVIDER_SITE_OTHER): Payer: Medicare PPO | Admitting: Cardiovascular Disease

## 2019-07-07 VITALS — BP 140/88 | HR 66 | Ht 65.0 in | Wt 117.4 lb

## 2019-07-07 DIAGNOSIS — I10 Essential (primary) hypertension: Secondary | ICD-10-CM | POA: Diagnosis not present

## 2019-07-07 DIAGNOSIS — Z170 Estrogen receptor positive status [ER+]: Secondary | ICD-10-CM | POA: Diagnosis not present

## 2019-07-07 DIAGNOSIS — C50411 Malignant neoplasm of upper-outer quadrant of right female breast: Secondary | ICD-10-CM

## 2019-07-07 DIAGNOSIS — I483 Typical atrial flutter: Secondary | ICD-10-CM

## 2019-07-07 NOTE — Patient Instructions (Addendum)
Look for drops in pressure when standing, orthostasis Check pressure when dizzy  Medication Instructions:  No changes  If you need a refill on your cardiac medications before your next appointment, please call your pharmacy.    Lab work: No new labs needed   If you have labs (blood work) drawn today and your tests are completely normal, you will receive your results only by: Marland Kitchen MyChart Message (if you have MyChart) OR . A paper copy in the mail If you have any lab test that is abnormal or we need to change your treatment, we will call you to review the results.   Testing/Procedures: We will order CT coronary calcium score $154.42 at our [facility] in [place]  Please call [phone] to schedule [facility] [place], [place] [address]   We will order a Zio monitor for tachycardia, hx of atrial flutter, dizziness Your physician has recommended that you wear a Zio monitor. This monitor is a medical device that records the heart's electrical activity. Doctors most often use these monitors to diagnose arrhythmias. Arrhythmias are problems with the speed or rhythm of the heartbeat. The monitor is a small device applied to your chest. You can wear one while you do your normal daily activities. While wearing this monitor if you have any symptoms to push the button and record what you felt. Once you have worn this monitor for the period of time provider prescribed (Usually 14 days), you will return the monitor device in the postage paid box. Once it is returned they will download the data collected and provide [place] with a report which the provider will then review and we will call you with those results. Important tips:  1. Avoid showering during the first 24 hours of wearing the monitor. 2. Avoid excessive sweating to help maximize wear time. 3. Do not submerge the device, no hot tubs, and no swimming pools. 4. Keep any  lotions or oils away from the patch. 5. After 24 hours you may shower with the patch on. Take brief showers with your back facing the shower head.  6. Do not remove patch once it has been placed because that will interrupt data and decrease adhesive wear time. 7. Push the button when you have any symptoms and write down what you were feeling. 8. Once you have completed wearing your monitor, remove and place into box which has postage paid and place in your outgoing mailbox.  9. If for some reason you have misplaced your box then call our office and we can provide another box and/or mail it off for you.       Follow-Up: At [facility], you and your health needs are our priority.  As part of our continuing mission to provide you with exceptional heart care, we have created designated Provider Care Teams.  These Care Teams include your primary Cardiologist (physician) and Advanced Practice Providers (APPs -  Physician Assistants and Nurse Practitioners) who all work together to provide you with the care you need, when you need it.  . You will need a follow up appointment as needed  . Providers on your designated Care Team:   . Murray Hodgkins, NP . Christell Faith, PA-C . Marrianne Mood, PA-C  Any Other Special Instructions Will Be Listed Below (If Applicable).  For educational health videos Log in to : [email or web address] Or : [email or web address], password : [email or web address]

## 2019-07-11 ENCOUNTER — Other Ambulatory Visit: Payer: Self-pay | Admitting: Hematology and Oncology

## 2019-07-11 DIAGNOSIS — Z853 Personal history of malignant neoplasm of breast: Secondary | ICD-10-CM

## 2019-07-11 DIAGNOSIS — Z9889 Other specified postprocedural states: Secondary | ICD-10-CM

## 2019-07-17 DIAGNOSIS — C50411 Malignant neoplasm of upper-outer quadrant of right female breast: Secondary | ICD-10-CM | POA: Diagnosis not present

## 2019-07-17 DIAGNOSIS — Z170 Estrogen receptor positive status [ER+]: Secondary | ICD-10-CM | POA: Diagnosis not present

## 2019-07-23 ENCOUNTER — Ambulatory Visit
Admission: RE | Admit: 2019-07-23 | Discharge: 2019-07-23 | Disposition: A | Discharge status: Home / Self Care | Payer: Self-pay | Source: Ambulatory Visit | Attending: Cardiovascular Disease | Admitting: Cardiovascular Disease

## 2019-07-23 ENCOUNTER — Other Ambulatory Visit: Payer: Self-pay

## 2019-07-23 DIAGNOSIS — I483 Typical atrial flutter: Secondary | ICD-10-CM | POA: Insufficient documentation

## 2019-07-23 DIAGNOSIS — I700 Atherosclerosis of aorta: Secondary | ICD-10-CM | POA: Diagnosis not present

## 2019-07-24 ENCOUNTER — Encounter: Payer: Self-pay | Admitting: Cardiovascular Disease

## 2019-07-24 ENCOUNTER — Telehealth: Payer: Self-pay | Admitting: Cardiovascular Disease

## 2019-07-24 DIAGNOSIS — K2100 Gastro-esophageal reflux disease with esophagitis, without bleeding: Secondary | ICD-10-CM | POA: Diagnosis not present

## 2019-07-24 DIAGNOSIS — E559 Vitamin D deficiency, unspecified: Secondary | ICD-10-CM | POA: Diagnosis not present

## 2019-07-24 NOTE — Telephone Encounter (Signed)
Patient calling  Would like to know if we can fax over the results of the CT to Dr Theresa Hale at [place]  Please call to discuss - did mention that our doctor will need to read before being released

## 2019-07-24 NOTE — Telephone Encounter (Signed)
Spoke with patient and she received her results on My Chart. She wanted to review and advised that the results are still pending her provider review. I did discuss preliminary findings and requested that she have her PCP office to fax [place] her labs so Dr. Rockey Situ can have that information when he reviews her report. She verbalized understanding with no further questions at this time.

## 2019-07-28 ENCOUNTER — Telehealth: Payer: Self-pay | Admitting: *Deleted

## 2019-07-28 NOTE — Telephone Encounter (Signed)
Reviewed results of test and she verbalized understanding. She did request that they send [place] all labs from the last 3 years and we did receive some but not cholesterol. She had results handy and read those off to me. Advised that I would send those off to Dr. Rockey Situ for review in relation to the results. She verbalized understanding with no further questions at this time.   184 Total Cholesterol  103 Triglycerides  67 HDL 18 V-LDL 99 LDL  These were done 03/18/2019

## 2019-07-28 NOTE — Telephone Encounter (Signed)
-----   Message from Minna Merritts, MD sent at 07/27/2019  6:32 PM EDT ----- Coronary calcium scoring Mild coronary calcification seen in the LAD and left circumflex We do not have a cholesterol panel in our records Score is reasonably low though elevated for her age group No further testing needed Depending on the lab cholesterol numbers, could consider a cholesterol medication to slow any progression

## 2019-08-03 NOTE — Telephone Encounter (Signed)
Would consider starting Crestor 5 mg daily, goal LDL less than 70 Total cholesterol less than 150 Repeat lab work LFTs lipids in 3 months

## 2019-08-05 MED ORDER — ROSUVASTATIN CALCIUM 5 MG PO TABS: 5.0000 mg | ORAL_TABLET | Freq: Every day | ORAL | 3 refills | Status: DC

## 2019-08-05 NOTE — Addendum Note (Signed)
Addended by: Valora Corporal on: 08/05/2019 04:54 PM   Modules accepted: Orders

## 2019-08-05 NOTE — Telephone Encounter (Signed)
Spoke with patient and reviewed provider recommendations. She was willing to try medication low dose and instructed her to please call if any questions or concerns. She did want to know if we received her lipid panel from PCP office and advised that I have not received that. We did get a CMET that was done but no other labs. Provided her with our fax number and she will also request again. Sent in prescription for Crestor 5 mg once daily and she will have her repeat labs done at PCP office when she goes in September to have them done. She verbalized understanding of our conversation, agreement with plan, and had no further questions at this time.

## 2019-08-07 DIAGNOSIS — I483 Typical atrial flutter: Secondary | ICD-10-CM | POA: Diagnosis not present

## 2019-08-07 NOTE — Telephone Encounter (Signed)
Patient calling  Has some questions and concerns regarding rosuvastatin (CRESTOR) 5 MG Please call to discuss

## 2019-08-08 NOTE — Telephone Encounter (Signed)
Spoke with the patient. Patient sts that she did pick up the Crestor 5mg . Patient sts that she took one dose of the medication and then read the warning label. Patient sts that she read Crestor can cause elevated liver enzymes and she has a current issue with her liver enzymes.  She sts that she is sensitive to most medications and is concerned about Crestors listed side effects. She is is currently holding the medication.  Adv the patient that I will fwd the message to Dr. Rockey Situ and we will call back with his response and recommendation.

## 2019-08-08 NOTE — Telephone Encounter (Signed)
Returned the patient's call lmtcb if assistance is still needed. 

## 2019-08-10 NOTE — Telephone Encounter (Signed)
Review of her lab work shows liver numbers are normal It is the lowest dose Crestor available  .  We  prescribe this frequently and look for myalgias which can present in 5% of people typically at higher doses, less at the 5 mg dose If she does not want a statin she could try Zetia 10 mg daily Certainly her choice whether she would like to take a prescription medication or not  it is not mandatory, but a personal choice

## 2019-08-11 NOTE — Telephone Encounter (Signed)
Spoke with patient and reviewed provider recommendations. She was agreeable to try the crestor and see if she tolerates this medication. She does get routine labs at her primary care office and they are aware of her starting this as well. Reviewed that if she should have any symptoms or side effects to please give [place] a call so that we can document this for her. She verbalized understanding of our conversation, agreement with plan, and had no further questions at this time.

## 2019-08-13 ENCOUNTER — Ambulatory Visit
Admission: RE | Admit: 2019-08-13 | Discharge: 2019-08-13 | Disposition: A | Discharge status: Home / Self Care | Payer: Medicare PPO | Source: Ambulatory Visit | Attending: Hematology and Oncology | Admitting: Hematology and Oncology

## 2019-08-13 ENCOUNTER — Other Ambulatory Visit: Payer: Self-pay

## 2019-08-13 DIAGNOSIS — Z853 Personal history of malignant neoplasm of breast: Secondary | ICD-10-CM

## 2019-08-13 DIAGNOSIS — Z9889 Other specified postprocedural states: Secondary | ICD-10-CM

## 2019-08-13 DIAGNOSIS — R922 Inconclusive mammogram: Secondary | ICD-10-CM | POA: Diagnosis not present

## 2019-08-14 ENCOUNTER — Telehealth: Payer: Self-pay | Admitting: Cardiovascular Disease

## 2019-08-14 NOTE — Telephone Encounter (Signed)
Patient calling in to get heart monitor results. States she saw them on MyChart but would like for them to be explained

## 2019-08-14 NOTE — Telephone Encounter (Signed)
Called patient back and reviewed Dr. Donivan Scull report note as noted below:  Event monitor  Normal sinus rhythm avg HR of 71 bpm. max HR of 222 bpm  97 Supraventricular Tachycardia runs occurred, the run with the fastest interval lasting 4 beats with a max rate of 222 bpm, the longest lasting 1 min 5 secs with an avg rate of 164 bpm.  Supraventricular Tachycardia was detected within +/- 45 seconds of symptomatic patient event(s).   Junctional Rhythm was present.   Isolated SVEs were rare (<1.0%), SVE Couplets were rare (<1.0%), and SVE Triplets were rare (<1.0%). Isolated VEs were rare (<1.0%), VE Couplets were rare (<1.0%), and no VE Triplets were present.   Also discussed patients note that was routed to Dr. Rockey Situ on 6/29 concerning her feeling light headed and a throat closing sensation.  Was able to get her an appointment with Laurann Montana, NP for next week.  Patient was very appreciative of the phone call and follow up appointment.

## 2019-08-21 ENCOUNTER — Ambulatory Visit: Payer: Medicare PPO | Admitting: Family

## 2019-08-22 ENCOUNTER — Encounter: Payer: Self-pay | Admitting: Family

## 2019-08-22 ENCOUNTER — Other Ambulatory Visit: Payer: Self-pay

## 2019-08-22 ENCOUNTER — Ambulatory Visit: Payer: Medicare PPO | Admitting: Family

## 2019-08-22 VITALS — BP 120/88 | HR 68 | Ht 65.0 in | Wt 116.1 lb

## 2019-08-22 DIAGNOSIS — I2510 Atherosclerotic heart disease of native coronary artery without angina pectoris: Secondary | ICD-10-CM

## 2019-08-22 DIAGNOSIS — E785 Hyperlipidemia, unspecified: Secondary | ICD-10-CM

## 2019-08-22 DIAGNOSIS — R42 Dizziness and giddiness: Secondary | ICD-10-CM | POA: Diagnosis not present

## 2019-08-22 DIAGNOSIS — I471 Supraventricular tachycardia: Secondary | ICD-10-CM | POA: Diagnosis not present

## 2019-08-22 DIAGNOSIS — R002 Palpitations: Secondary | ICD-10-CM

## 2019-08-22 MED ORDER — ASPIRIN EC 81 MG PO TBEC: 81.0000 mg | DELAYED_RELEASE_TABLET | Freq: Every day | ORAL | 0 refills | Status: DC

## 2019-08-22 MED ORDER — METOPROLOL SUCCINATE ER 25 MG PO TB24: 12.5000 mg | ORAL_TABLET | Freq: Every day | ORAL | 0 refills | Status: DC

## 2019-08-22 NOTE — Patient Instructions (Addendum)
Medication Instructions:  Your physician has recommended you make the following change in your medication:   START Aspirin 81mg  daily  START Metoprolol succinate 25 mg- take 0.5 tablet (12.5 mg) once daily at bedtime  CONTINUE Crestor 5mg  daily   - Samples given: Aspirin 81 mg Lot: [id number] Exp: 11/21 # 6 boxes  *If you need a refill on your cardiac medications before your next appointment, please call your pharmacy*  Lab Work: No lab work recommended today.   Testing/Procedures: Your ZIO monitor showed 97 episodes of tachycardia.   Follow-Up: At [facility], you and your health needs are our priority.  As part of our continuing mission to provide you with exceptional heart care, we have created designated Provider Care Teams.  These Care Teams include your primary Cardiologist (physician) and Advanced Practice Providers (APPs -  Physician Assistants and Nurse Practitioners) who all work together to provide you with the care you need, when you need it.  We recommend signing up for the patient portal called "MyChart".  Sign up information is provided on this After Visit Summary.  MyChart is used to connect with patients for Virtual Visits (Telemedicine).  Patients are able to view lab/test results, encounter notes, upcoming appointments, etc.  Non-urgent messages can be sent to your provider as well.   To learn more about what you can do with MyChart, go to [email or web address].    Your next appointment:   2-3 months  The format for your next appointment:   In Person  Provider:    You may see Ida Rogue, MD or one of the following Advanced Practice Providers on your designated Care Team:    Murray Hodgkins, NP  Christell Faith, PA-C  Marrianne Mood, PA-C  Laurann Montana, NP  Other Instructions   Recommend adding fluids with electrolytes.  Recommend compression stockings.  Orthostatic Hypotension Blood pressure is a measurement of how strongly, or weakly,  your blood is pressing against the walls of your arteries. Orthostatic hypotension is a sudden drop in blood pressure that happens when you quickly change positions, such as when you get up from sitting or lying down. Arteries are blood vessels that carry blood from your heart throughout your body. When blood pressure is too low, you may not get enough blood to your brain or to the rest of your organs. This can cause weakness, light-headedness, rapid heartbeat, and fainting. This can last for just a few seconds or for up to a few minutes. Orthostatic hypotension is usually not a serious problem. However, if it happens frequently or gets worse, it may be a sign of something more serious. What are the causes? This condition may be caused by:  Sudden changes in posture, such as standing up quickly after you have been sitting or lying down.  Blood loss.  Loss of body fluids (dehydration).  Heart problems.  Hormone (endocrine) problems.  Pregnancy.  Severe infection.  Lack of certain nutrients.  Severe allergic reactions (anaphylaxis).  Certain medicines, such as blood pressure medicine or medicines that make the body lose excess fluids (diuretics). Sometimes, this condition can be caused by not taking medicine as directed, such as taking too much of a certain medicine. What increases the risk? The following factors may make you more likely to develop this condition:  Age. Risk increases as you get older.  Conditions that affect the heart or the central nervous system.  Taking certain medicines, such as blood pressure medicine or diuretics.  Being pregnant. What are the  signs or symptoms? Symptoms of this condition may include:  Weakness.  Light-headedness.  Dizziness.  Blurred vision.  Fatigue.  Rapid heartbeat.  Fainting, in severe cases. How is this diagnosed? This condition is diagnosed based on:  Your medical history.  Your symptoms.  Your blood pressure  measurement. Your health care provider will check your blood pressure when you are: ? Lying down. ? Sitting. ? Standing. A blood pressure reading is recorded as two numbers, such as "120 over 80" (or 120/80). The first ("top") number is called the systolic pressure. It is a measure of the pressure in your arteries as your heart beats. The second ("bottom") number is called the diastolic pressure. It is a measure of the pressure in your arteries when your heart relaxes between beats. Blood pressure is measured in a unit called mm Hg. Healthy blood pressure for most adults is 120/80. If your blood pressure is below 90/60, you may be diagnosed with hypotension. Other information or tests that may be used to diagnose orthostatic hypotension include:  Your other vital signs, such as your heart rate and temperature.  Blood tests.  Tilt table test. For this test, you will be safely secured to a table that moves you from a lying position to an upright position. Your heart rhythm and blood pressure will be monitored during the test. How is this treated? This condition may be treated by:  Changing your diet. This may involve eating more salt (sodium) or drinking more water.  Taking medicines to raise your blood pressure.  Changing the dosage of certain medicines you are taking that might be lowering your blood pressure.  Wearing compression stockings. These stockings help to prevent blood clots and reduce swelling in your legs. In some cases, you may need to go to the hospital for:  Fluid replacement. This means you will receive fluids through an IV.  Blood replacement. This means you will receive donated blood through an IV (transfusion).  Treating an infection or heart problems, if this applies.  Monitoring. You may need to be monitored while medicines that you are taking wear off. Follow these instructions at home: Eating and drinking   Drink enough fluid to keep your urine pale  yellow.  Eat a healthy diet, and follow instructions from your health care provider about eating or drinking restrictions. A healthy diet includes: ? Fresh fruits and vegetables. ? Whole grains. ? Lean meats. ? Low-fat dairy products.  Eat extra salt only as directed. Do not add extra salt to your diet unless your health care provider told you to do that.  Eat frequent, small meals.  Avoid standing up suddenly after eating. Medicines  Take over-the-counter and prescription medicines only as told by your health care provider. ? Follow instructions from your health care provider about changing the dosage of your current medicines, if this applies. ? Do not stop or adjust any of your medicines on your own. General instructions   Wear compression stockings as told by your health care provider.  Get up slowly from lying down or sitting positions. This gives your blood pressure a chance to adjust.  Avoid hot showers and excessive heat as directed by your health care provider.  Return to your normal activities as told by your health care provider. Ask your health care provider what activities are safe for you.  Do not use any products that contain nicotine or tobacco, such as cigarettes, e-cigarettes, and chewing tobacco. If you need help quitting, ask your health  care provider.  Keep all follow-up visits as told by your health care provider. This is important. Contact a health care provider if you:  Vomit.  Have diarrhea.  Have a fever for more than 2-3 days.  Feel more thirsty than usual.  Feel weak and tired. Get help right away if you:  Have chest pain.  Have a fast or irregular heartbeat.  Develop numbness in any part of your body.  Cannot move your arms or your legs.  Have trouble speaking.  Become sweaty or feel light-headed.  Faint.  Feel short of breath.  Have trouble staying awake.  Feel confused. Summary  Orthostatic hypotension is a sudden drop  in blood pressure that happens when you quickly change positions.  Orthostatic hypotension is usually not a serious problem.  It is diagnosed by having your blood pressure taken lying down, sitting, and then standing.  It may be treated by changing your diet or adjusting your medicines. This information is not intended to replace advice given to you by your health care provider. Make sure you discuss any questions you have with your health care provider. Document Revised: 07/26/2017 Document Reviewed: 07/26/2017 Elsevier Patient Education  [place].

## 2019-08-22 NOTE — Progress Notes (Signed)
Office Visit    Patient Name: Theresa Hale Date of Encounter: 08/22/2019  Primary Care Provider:  Caryl Bis, MD Primary Cardiologist:  Ida Rogue, MD Electrophysiologist:  None   Chief Complaint    Theresa Hale is a 64 y.o. female with a hx of breast cancer, atrial flutter in 2018, tachycardia, HTN, HLD, allergic rhinitis, GERD presents today for follow-up after ZIO monitor  Past Medical History    Past Medical History:  Diagnosis Date  . Atrial flutter ([place])   . GERD (gastroesophageal reflux disease)   . Personal history of radiation therapy 02/2018  . PONV (postoperative nausea and vomiting)    Past Surgical History:  Procedure Laterality Date  . ABDOMINAL HYSTERECTOMY    . BREAST BIOPSY Right 10/02/2017  . BREAST EXCISIONAL BIOPSY Right 01/2002  . BREAST LUMPECTOMY Right 2019  . BREAST LUMPECTOMY WITH RADIOACTIVE SEED AND SENTINEL LYMPH NODE BIOPSY Right 11/26/2017   Procedure: RIGHT BREAST LUMPECTOMY WITH SENTINEL NODE MAPPING AND TARGETED NODE DISECTION ERAS PATHWAY;  Surgeon: Jovita Kussmaul, MD;  Location: [facility];  Service: General;  Laterality: Right;  . CESAREAN SECTION    . CHOLECYSTECTOMY    . KNEE ARTHROSCOPY    . NECK SURGERY      Allergies  Allergies  Allergen Reactions  . Penicillins Swelling  . Erythromycin Nausea And Vomiting  . Sulfa Antibiotics Nausea And Vomiting  . Sulfasalazine Nausea And Vomiting    History of Present Illness    Theresa Hale is a 64 y.o. female with a hx of breast cancer, atrial flutter in 2018, tachycardia, HTN, HLD, coronary artery calcification by CT, allergic rhinitis, GERD  last seen 07/07/2019 by Dr. Rockey Situ.  Previously seen by Dr. Harl Bowie in 2018 for atrial flutter. She was started on metoprolol and aspirin. Echocardiogram 2018 with LVEF 21-11%, normal diastolic parameters, no significant valvular abnormalities.   Seen in clinic 07/07/19 with tachycardia, dizziness with standing. Her  orthostatic vital signs were negative. She was recommended for ZIO monitor as well as CT coronary calcium score.   ZIO monitor 07/07/19 with predominantly NSR and 97 runs of SVT - fastest 4 beats at 22 bpm and longest 1 min 5 sec with rate 164 bpm. It was associated with triggered events. Also noted junctional rhythm and rare (<1%) PVC/PAC.  Ct calcium score 07/23/19 calcium score of 262 placing her in 90th percentile for age and sex matched control. On review by Dr. Rockey Situ, "mild coronary calcification in LAD and LCx". She was started on Crestor 60m daily.   Present today with her husband for follow-up.  We reviewed monitor report in detail.  We reviewed Dr. GRockey Siturecommendation for metoprolol 12.5 mg daily.   She does have some concerns understandably as she also gets lightheaded and has a low normal blood pressure.  We discussed that some of her lightheadedness could be attributed to the tachycardia.  We reviewed orthostatic precautions including staying well-hydrated, compression stockings, slow position changes.  We reviewed her cardiac calcium score and discussed LDL goal of less than 70.  She is tolerating Crestor well.  Reports no shortness of breath nor dyspnea on exertion. Reports no chest pain, pressure, or tightness. No edema, orthopnea, PND.  EKGs/Labs/Other Studies Reviewed:   The following studies were reviewed today:  ZIO 07/07/19 Normal sinus rhythm avg HR of 71 bpm. max HR of 222 bpm   97 Supraventricular Tachycardia runs occurred, the run with the fastest interval lasting 4  beats with a max rate of 222 bpm, the longest lasting 1 min 5 secs with an avg rate of 164 bpm.  Supraventricular Tachycardia was detected within +/- 45 seconds of symptomatic patient event(s).    Junctional Rhythm was present.    Isolated SVEs were rare (<1.0%), SVE Couplets were rare (<1.0%), and SVE Triplets were rare (<1.0%). Isolated VEs were rare (<1.0%), VE Couplets were rare (<1.0%), and no VE  Triplets were present.   EKG:  No EKG today.  Recent Labs: No results found for requested labs within last 8760 hours.  Recent Lipid Panel No results found for: CHOL, TRIG, HDL, CHOLHDL, VLDL, LDLCALC, LDLDIRECT  Home Medications   Current Meds  Medication Sig  . Cholecalciferol (DIALYVITE VITAMIN D 5000 PO) Take 5,000 Units by mouth daily.  Marland Kitchen esomeprazole (NEXIUM) 40 MG capsule Take 40 mg by mouth daily at 12 noon.   . fexofenadine (ALLEGRA ALLERGY) 180 MG tablet Take 180 mg by mouth daily.   . rosuvastatin (CRESTOR) 5 MG tablet Take 1 tablet (5 mg total) by mouth daily.    Review of Systems      Review of Systems  Constitutional: Negative for chills, fever and malaise/fatigue.  Cardiovascular: Positive for palpitations. Negative for chest pain, dyspnea on exertion, irregular heartbeat, leg swelling, near-syncope, orthopnea and syncope.  Respiratory: Negative for cough, shortness of breath and wheezing.   Gastrointestinal: Negative for melena, nausea and vomiting.  Genitourinary: Negative for hematuria.  Neurological: Positive for light-headedness. Negative for dizziness and weakness.   All other systems reviewed and are otherwise negative except as noted above.  Physical Exam    VS:  BP 120/88 (BP Location: Left Arm, Patient Position: Sitting, Cuff Size: Normal)   Pulse 68   Ht '5\' 5"'  (1.651 m)   Wt 116 lb 2 oz (52.7 kg)   SpO2 98%   BMI 19.32 kg/m  , BMI Body mass index is 19.32 kg/m. GEN: Well nourished, well developed, in no acute distress. HEENT: normal. Neck: Supple, no JVD, carotid bruits, or masses. Cardiac: RRR, no murmurs, rubs, or gallops. No clubbing, cyanosis, edema.  Radials/DP/PT 2+ and equal bilaterally.  Respiratory:  Respirations regular and unlabored, clear to auscultation bilaterally. GI: Soft, nontender, nondistended, BS + x 4. MS: No deformity or atrophy. Skin: Warm and dry, no rash. Neuro:  Strength and sensation are intact. Psych: Normal  affect.   Assessment & Plan    1. Coronary artery calcification on CT - Calcium score 262 placing her in 90th percentil for age/sex. Start Aspirin 30m daily. Continue Crestor 552mdaily. Start Toprol 12.66m71maily.  Regular cardiovascular exercise encouraged.  Listening, healthy diet encouraged.  2. Palpitations/tachycardia/SVT -ZIO monitor with 97 episodes of SVT.  Triggered episodes occurred within 45 seconds of SVT.  Recent blood work includes normal thyroid function, electrolytes, hemoglobin.  Plan to start Toprol 12.5 mg daily.  3. HLD -LDL goal less than 70 in the setting of coronary artery calcifications on CT.  She is tolerating Crestor 5 mg daily without difficulty. She has upcoming lab work with primary care provider. We did discuss doing repeat lipid panel at follow for reassessment of LDL. She has had mildly elevate alk phos on previous labs with normal ALT, AST - continue to monitor.   Disposition: Follow up 2-3 months with Dr. GolRockey Situ APP  CaiLoel DubonnetP 08/22/2019, 10:11 AM

## 2019-08-24 NOTE — Telephone Encounter (Signed)
On days when she is dizzy or see drops in blood pressure with standing it will be important to push fluids Liberalize her salt intake Avoid getting too hot on those days We do not see significant bradycardia on 2-week monitor, but I want to make of a 48 heart rate as we did not see much of that over 2 weeks No atrial flutter seen on a short runs of tachycardia that can be treated with the metoprolol half dose or full dose, or half dose daily with extra half dose as needed days with tachycardia Drops in blood pressure typically treated with aggressive hydration compression hose abdominal binder/back brace, liberalizing salt intake

## 2019-08-25 ENCOUNTER — Encounter: Payer: Self-pay | Admitting: Cardiovascular Disease

## 2019-08-25 DIAGNOSIS — Z13220 Encounter for screening for lipoid disorders: Secondary | ICD-10-CM | POA: Diagnosis not present

## 2019-08-25 DIAGNOSIS — K2100 Gastro-esophageal reflux disease with esophagitis, without bleeding: Secondary | ICD-10-CM | POA: Diagnosis not present

## 2019-08-25 DIAGNOSIS — M1990 Unspecified osteoarthritis, unspecified site: Secondary | ICD-10-CM | POA: Diagnosis not present

## 2019-08-25 DIAGNOSIS — R5383 Other fatigue: Secondary | ICD-10-CM | POA: Diagnosis not present

## 2019-08-26 ENCOUNTER — Telehealth: Payer: Self-pay | Admitting: *Deleted

## 2019-08-26 MED ORDER — METOPROLOL SUCCINATE ER 25 MG PO TB24: 12.5000 mg | ORAL_TABLET | Freq: Two times a day (BID) | ORAL | 0 refills | Status: DC | PRN

## 2019-08-26 MED ORDER — METOPROLOL SUCCINATE ER 25 MG PO TB24: 12.5000 mg | ORAL_TABLET | Freq: Every day | ORAL | 6 refills | Status: DC

## 2019-08-26 NOTE — Telephone Encounter (Signed)
Spoke with patient and she was recently seen by [facility] in our office since these messages were received. Pt reports she is unable to tolerate the metoprolol due to fatigue and no energy so she is not going to take it. She did report if she had episode of the tachycardia that persisted then she would take a dose but just not take on a regular basis. Advised I would make provider aware and she was appreciative for the follow up call. She verbalized understanding of our conversation and had no further questions at this time.

## 2019-08-26 NOTE — Telephone Encounter (Signed)
-----   Message from Minna Merritts, MD sent at 08/20/2019  2:34 PM EDT ----- Monitor reviewed Rare short episodes of tachycardia sometimes lasting up to 1 minute When she had the event button it did show tachycardia Weakness metoprolol succinate 12.5 mg daily for 1 week if she continues to have tachycardia symptoms we could increase up to 25 mg daily We could send a 25 mg pill and she could break in half to start

## 2019-10-03 ENCOUNTER — Other Ambulatory Visit: Payer: Self-pay | Admitting: Family Medicine

## 2019-10-03 DIAGNOSIS — M810 Age-related osteoporosis without current pathological fracture: Secondary | ICD-10-CM

## 2019-10-21 ENCOUNTER — Ambulatory Visit
Admission: RE | Admit: 2019-10-21 | Discharge: 2019-10-21 | Disposition: A | Discharge status: Home / Self Care | Payer: Medicare PPO | Source: Ambulatory Visit | Attending: Family Medicine | Admitting: Family Medicine

## 2019-10-21 DIAGNOSIS — M810 Age-related osteoporosis without current pathological fracture: Secondary | ICD-10-CM | POA: Insufficient documentation

## 2019-10-21 DIAGNOSIS — M8588 Other specified disorders of bone density and structure, other site: Secondary | ICD-10-CM | POA: Diagnosis not present

## 2019-10-21 DIAGNOSIS — Z780 Asymptomatic menopausal state: Secondary | ICD-10-CM | POA: Diagnosis not present

## 2019-10-28 DIAGNOSIS — Z13228 Encounter for screening for other metabolic disorders: Secondary | ICD-10-CM | POA: Diagnosis not present

## 2019-10-28 DIAGNOSIS — K2100 Gastro-esophageal reflux disease with esophagitis, without bleeding: Secondary | ICD-10-CM | POA: Diagnosis not present

## 2019-10-28 DIAGNOSIS — E755 Other lipid storage disorders: Secondary | ICD-10-CM | POA: Diagnosis not present

## 2019-10-28 DIAGNOSIS — L570 Actinic keratosis: Secondary | ICD-10-CM | POA: Diagnosis not present

## 2019-11-05 ENCOUNTER — Telehealth: Payer: Self-pay | Admitting: Cardiovascular Disease

## 2019-11-05 MED ORDER — ROSUVASTATIN CALCIUM 5 MG PO TABS: 5.0000 mg | ORAL_TABLET | Freq: Every day | ORAL | 3 refills | Status: DC

## 2019-11-05 NOTE — Telephone Encounter (Signed)
Refill sent in to pharmacy requested. 

## 2019-11-05 NOTE — Telephone Encounter (Signed)
*  STAT* If patient is at the pharmacy, call can be transferred to refill team.   1. Which medications need to be refilled? (please list name of each medication and dose if known) rosuvastatin (CRESTOR) 5 MG  2. Which pharmacy/location (including street and city if local pharmacy) is medication to be sent to? [facility] on [address]  3. Do they need a 30 day or 90 day supply? 90 day

## 2019-11-24 NOTE — Progress Notes (Signed)
Cardiology Office Note  Date:  11/25/2019   ID:  CHEE DIMON, DOB Aug 31, 1955, MRN [id number]  PCP:  Caryl Bis, MD   Chief Complaint  Patient presents with  . office visit    3 month F/U; Meds verbally reviewed with patient.    HPI:  [name] is a 64 year old woman with past medical history of Breast cancer Atrial flutter in 2018 HTN CT coronary calcium score 262 Long history of paroxysmal tachycardia, symptomatic Who presents for routine follow-up of her tachycardia, hx of atrial flutter  Seen by Dr. Harl Bowie, cardiology, in 05/2016 for atrial flutter Was started on metoprolol, asa  Seen in our clinic May 2021 Zio monitor was ordered for tachycardia and dizziness CT coronary calcium scoring to 62 as above  Other results reviewed Zio: Rare short episodes of tachycardia sometimes lasting up to 1 minute When she had the event button it did show tachycardia Suggested she could try metoprolol succinate 12.5 mg daily for 1 week if she continues to have tachycardia symptoms we could increase up to 25 mg daily  Was seen by one of our providers July 2021  She is concerned about continued tachypalpitations Monitor reviewed showing frequent runs SVT sometimes 180 up to 200 bpm Feels lightheaded during some of these episodes Reports given blood pressure typically 98 systolic, she is unable to tolerate metoprolol or other beta-blockers or calcium channel blockers Wonders what to do next Husband presents with her  EKG personally reviewed by myself on todays visit Shows normal sinus rhythm rate 75 bpm no significant ST-T wave changes   Other past medical history reviewed Echo 2018  Left ventricle: The cavity size was normal. Wall thickness was  normal. Systolic function was normal. The estimated ejection  fraction was in the range of 60% to 65%. Wall motion was normal;  there were no regional wall motion abnormalities. There was an  increased relative  contribution of atrial contraction to  ventricular filling. Left ventricular diastolic function  parameters were normal.  - Aortic valve: Mildly calcified annulus. Probably trileaflet.  - Mitral valve: Mildly calcified annulus.  - Tricuspid valve: There was mild regurgitation.  03/29/2019 Near syncope Went down on the ground, etiology unclear   PMH:   has a past medical history of Atrial flutter ([place]), GERD (gastroesophageal reflux disease), Personal history of radiation therapy (02/2018), and PONV (postoperative nausea and vomiting).  PSH:    Past Surgical History:  Procedure Laterality Date  . ABDOMINAL HYSTERECTOMY    . BREAST BIOPSY Right 10/02/2017  . BREAST EXCISIONAL BIOPSY Right 01/2002  . BREAST LUMPECTOMY Right 2019  . BREAST LUMPECTOMY WITH RADIOACTIVE SEED AND SENTINEL LYMPH NODE BIOPSY Right 11/26/2017   Procedure: RIGHT BREAST LUMPECTOMY WITH SENTINEL NODE MAPPING AND TARGETED NODE DISECTION ERAS PATHWAY;  Surgeon: Jovita Kussmaul, MD;  Location: [facility];  Service: General;  Laterality: Right;  . CESAREAN SECTION    . CHOLECYSTECTOMY    . KNEE ARTHROSCOPY    . NECK SURGERY      Current Outpatient Medications  Medication Sig Dispense Refill  . aspirin EC 81 MG tablet Take 1 tablet (81 mg total) by mouth daily. Swallow whole. 192 tablet 0  . Cholecalciferol (DIALYVITE VITAMIN D 5000 PO) Take 5,000 Units by mouth daily.    . fexofenadine (ALLEGRA ALLERGY) 180 MG tablet Take 180 mg by mouth daily.     . Licorice, Glycyrrhiza glabra, (LICORICE ROOT PO) Take 1 Dose by mouth daily.    Marland Kitchen  metoprolol succinate (TOPROL XL) 25 MG 24 hr tablet Take 0.5 tablets (12.5 mg total) by mouth at bedtime. Take as needed for fast heart rates. 15 tablet 6  . rosuvastatin (CRESTOR) 5 MG tablet Take 1 tablet (5 mg total) by mouth daily. 90 tablet 3  . esomeprazole (NEXIUM) 40 MG capsule Take 40 mg by mouth daily at 12 noon.      No current facility-administered  medications for this visit.     Allergies:   Penicillins, Erythromycin, Sulfa antibiotics, and Sulfasalazine   Social History:  The patient  reports that she has never smoked. She has never used smokeless tobacco. She reports that she does not drink alcohol and does not use drugs.   Family History:   family history includes Alzheimer's disease in her father; Breast cancer (age of onset: 4) in her sister; Breast cancer (age of onset: 107) in her mother; Parkinson's disease in her father; Stroke in her mother.    Review of Systems: Review of Systems  Constitutional: Negative.   HENT: Negative.   Respiratory: Negative.   Cardiovascular: Positive for palpitations.       Tachycardia  Gastrointestinal: Negative.   Musculoskeletal: Negative.   Neurological: Positive for dizziness.  Psychiatric/Behavioral: Negative.   All other systems reviewed and are negative.   PHYSICAL EXAM: VS:  BP 116/84 (BP Location: Left Arm, Patient Position: Sitting, Cuff Size: Normal)   Pulse 75   Ht 5\' 5"  (1.651 m)   Wt 113 lb 8 oz (51.5 kg)   SpO2 98%   BMI 18.89 kg/m  , BMI Body mass index is 18.89 kg/m. GEN: Well nourished, well developed, in no acute distress HEENT: normal Neck: no JVD, carotid bruits, or masses Cardiac: RRR; no murmurs, rubs, or gallops,no edema  Respiratory:  clear to auscultation bilaterally, normal work of breathing GI: soft, nontender, nondistended, + BS MS: no deformity or atrophy Skin: warm and dry, no rash Neuro:  Strength and sensation are intact Psych: euthymic mood, full affect   Recent Labs: No results found for requested labs within last 8760 hours.    Lipid Panel No results found for: CHOL, HDL, LDLCALC, TRIG    Wt Readings from Last 3 Encounters:  11/25/19 113 lb 8 oz (51.5 kg)  08/22/19 116 lb 2 oz (52.7 kg)  07/07/19 117 lb 6 oz (53.2 kg)     ASSESSMENT AND PLAN:  Problem List Items Addressed This Visit    None    Visit Diagnoses    Coronary  artery calcification seen on CT scan    -  Primary   Relevant Orders   EKG 12-Lead   Ambulatory referral to Cardiac Electrophysiology   Supraventricular tachycardia ([place])       Relevant Orders   EKG 12-Lead   Ambulatory referral to Cardiac Electrophysiology   Hyperlipidemia LDL goal <70       Relevant Orders   EKG 12-Lead   Ambulatory referral to Cardiac Electrophysiology   Palpitations       Relevant Orders   EKG 12-Lead   Ambulatory referral to Cardiac Electrophysiology   Lightheadedness       Relevant Orders   EKG 12-Lead   Ambulatory referral to Cardiac Electrophysiology     Event monitor notable for frequent episodes of narrow complex tachycardia Presumed to be SVT, she does have prior history of flutter She is symptomatic, unable to tolerate beta-blockers or calcium channel blockers given low blood pressure Tachycardia symptoms dating back many years -Long discussion  with her, recommend she consider talking with EP for consideration of ablation  Dizziness Associated with her tachycardia episodes Plan as above  Coronary calcification Very mild calcification, continue low-dose Crestor   total encounter time more than 25 minutes  Greater than 50% was spent in counseling and coordination of care with the patient    Signed, Esmond Plants, M.D., Ph.D. [place], [place]

## 2019-11-25 ENCOUNTER — Other Ambulatory Visit: Payer: Self-pay

## 2019-11-25 ENCOUNTER — Ambulatory Visit: Payer: Medicare PPO | Admitting: Cardiovascular Disease

## 2019-11-25 ENCOUNTER — Encounter: Payer: Self-pay | Admitting: Cardiovascular Disease

## 2019-11-25 VITALS — BP 116/84 | HR 75 | Ht 65.0 in | Wt 113.5 lb

## 2019-11-25 DIAGNOSIS — I471 Supraventricular tachycardia, unspecified: Secondary | ICD-10-CM

## 2019-11-25 DIAGNOSIS — R42 Dizziness and giddiness: Secondary | ICD-10-CM

## 2019-11-25 DIAGNOSIS — E785 Hyperlipidemia, unspecified: Secondary | ICD-10-CM

## 2019-11-25 DIAGNOSIS — R002 Palpitations: Secondary | ICD-10-CM | POA: Diagnosis not present

## 2019-11-25 DIAGNOSIS — I2510 Atherosclerotic heart disease of native coronary artery without angina pectoris: Secondary | ICD-10-CM | POA: Diagnosis not present

## 2019-11-25 NOTE — Patient Instructions (Addendum)
Medication Instructions:  No changes  If you need a refill on your cardiac medications before your next appointment, please call your pharmacy.    Lab work: No new labs needed   If you have labs (blood work) drawn today and your tests are completely normal, you will receive your results only by: Marland Kitchen MyChart Message (if you have MyChart) OR . A paper copy in the mail If you have any lab test that is abnormal or we need to change your treatment, we will call you to review the results.   Testing/Procedures: No new testing needed   Follow-Up: At [facility], you and your health needs are our priority.  As part of our continuing mission to provide you with exceptional heart care, we have created designated Provider Care Teams.  These Care Teams include your primary Cardiologist (physician) and Advanced Practice Providers (APPs -  Physician Assistants and Nurse Practitioners) who all work together to provide you with the care you need, when you need it.  . You will need a follow up appointment with EP . Referral placed for Dr. Curt Bears in [place] office  . Providers on your designated Care Team:   . Murray Hodgkins, NP . Christell Faith, PA-C . Marrianne Mood, PA-C  Any Other Special Instructions Will Be Listed Below (If Applicable).  COVID-19 Vaccine Information can be found at: ShippingScam.co.uk For questions related to vaccine distribution or appointments, please email [email or web address] or call [phone])[phone].

## 2019-12-03 DIAGNOSIS — K760 Fatty (change of) liver, not elsewhere classified: Secondary | ICD-10-CM | POA: Diagnosis not present

## 2019-12-03 DIAGNOSIS — Z681 Body mass index (BMI) 19 or less, adult: Secondary | ICD-10-CM | POA: Diagnosis not present

## 2019-12-03 DIAGNOSIS — R413 Other amnesia: Secondary | ICD-10-CM | POA: Diagnosis not present

## 2019-12-04 ENCOUNTER — Other Ambulatory Visit: Payer: Self-pay | Admitting: Family Medicine

## 2019-12-04 ENCOUNTER — Telehealth: Payer: Self-pay | Admitting: Cardiovascular Disease

## 2019-12-04 ENCOUNTER — Encounter: Payer: Self-pay | Admitting: Nurse Practitioner

## 2019-12-04 ENCOUNTER — Ambulatory Visit: Payer: Medicare PPO | Admitting: Nurse Practitioner

## 2019-12-04 ENCOUNTER — Emergency Department
Admission: EM | Admit: 2019-12-04 | Discharge: 2019-12-04 | Disposition: A | Discharge status: Home / Self Care | Payer: Medicare PPO | Attending: Emergency Medicine | Admitting: Emergency Medicine

## 2019-12-04 ENCOUNTER — Emergency Department: Payer: Medicare PPO

## 2019-12-04 ENCOUNTER — Other Ambulatory Visit: Payer: Self-pay

## 2019-12-04 VITALS — BP 130/84 | HR 142 | Ht 65.0 in | Wt 113.0 lb

## 2019-12-04 DIAGNOSIS — I483 Typical atrial flutter: Secondary | ICD-10-CM

## 2019-12-04 DIAGNOSIS — Z7901 Long term (current) use of anticoagulants: Secondary | ICD-10-CM | POA: Insufficient documentation

## 2019-12-04 DIAGNOSIS — Z853 Personal history of malignant neoplasm of breast: Secondary | ICD-10-CM | POA: Insufficient documentation

## 2019-12-04 DIAGNOSIS — R0600 Dyspnea, unspecified: Secondary | ICD-10-CM | POA: Insufficient documentation

## 2019-12-04 DIAGNOSIS — I471 Supraventricular tachycardia, unspecified: Secondary | ICD-10-CM

## 2019-12-04 DIAGNOSIS — E782 Mixed hyperlipidemia: Secondary | ICD-10-CM

## 2019-12-04 DIAGNOSIS — Z79899 Other long term (current) drug therapy: Secondary | ICD-10-CM | POA: Insufficient documentation

## 2019-12-04 DIAGNOSIS — R413 Other amnesia: Secondary | ICD-10-CM

## 2019-12-04 DIAGNOSIS — R5383 Other fatigue: Secondary | ICD-10-CM | POA: Diagnosis not present

## 2019-12-04 DIAGNOSIS — R42 Dizziness and giddiness: Secondary | ICD-10-CM | POA: Diagnosis not present

## 2019-12-04 DIAGNOSIS — R000 Tachycardia, unspecified: Secondary | ICD-10-CM | POA: Diagnosis present

## 2019-12-04 DIAGNOSIS — I4892 Unspecified atrial flutter: Secondary | ICD-10-CM | POA: Diagnosis not present

## 2019-12-04 LAB — COMPREHENSIVE METABOLIC PANEL
ALT: 28 U/L (ref 0–44)
AST: 35 U/L (ref 15–41)
Albumin: 4.8 g/dL (ref 3.5–5.0)
Alkaline Phosphatase: 137 U/L — ABNORMAL HIGH (ref 38–126)
Anion gap: 14 (ref 5–15)
BUN: 19 mg/dL (ref 8–23)
CO2: 23 mmol/L (ref 22–32)
Calcium: 9.7 mg/dL (ref 8.9–10.3)
Chloride: 100 mmol/L (ref 98–111)
Creatinine, Ser: 0.76 mg/dL (ref 0.44–1.00)
GFR, Estimated: >60 mL/min (ref >60)
Glucose, Bld: 111 mg/dL — ABNORMAL HIGH (ref 70–99)
Potassium: 3.8 mmol/L (ref 3.5–5.1)
Sodium: 137 mmol/L (ref 135–145)
Total Bilirubin: 1 mg/dL (ref 0.3–1.2)
Total Protein: 7.8 g/dL (ref 6.5–8.1)

## 2019-12-04 LAB — CBC WITH DIFFERENTIAL/PLATELET
Abs Immature Granulocytes: 0.02 10*3/uL (ref 0.00–0.07)
Basophils Absolute: 0 10*3/uL (ref 0.0–0.1)
Basophils Relative: 0 %
Eosinophils Absolute: 0 10*3/uL (ref 0.0–0.5)
Eosinophils Relative: 0 %
HCT: 42.2 % (ref 36.0–46.0)
Hemoglobin: 14.6 g/dL (ref 12.0–15.0)
Immature Granulocytes: 0 %
Lymphocytes Relative: 14 %
Lymphs Abs: 1.2 10*3/uL (ref 0.7–4.0)
MCH: 29.2 pg (ref 26.0–34.0)
MCHC: 34.6 g/dL (ref 30.0–36.0)
MCV: 84.4 fL (ref 80.0–100.0)
Monocytes Absolute: 0.6 10*3/uL (ref 0.1–1.0)
Monocytes Relative: 7 %
Neutro Abs: 6.4 10*3/uL (ref 1.7–7.7)
Neutrophils Relative %: 79 %
Platelets: 229 10*3/uL (ref 150–400)
RBC: 5 MIL/uL (ref 3.87–5.11)
RDW: 13.2 % (ref 11.5–15.5)
WBC: 8.1 10*3/uL (ref 4.0–10.5)
nRBC: 0 % (ref 0.0–0.2)

## 2019-12-04 LAB — TSH: TSH: 3.74 u[IU]/mL (ref 0.350–4.500)

## 2019-12-04 LAB — BRAIN NATRIURETIC PEPTIDE: B Natriuretic Peptide: 385.8 pg/mL — ABNORMAL HIGH (ref 0.0–100.0)

## 2019-12-04 LAB — MAGNESIUM: Magnesium: 2.1 mg/dL (ref 1.7–2.4)

## 2019-12-04 MED ORDER — APIXABAN 2.5 MG PO TABS: 2.5000 mg | ORAL_TABLET | Freq: Two times a day (BID) | ORAL | 0 refills | Status: DC

## 2019-12-04 MED ORDER — DILTIAZEM HCL 25 MG/5ML IV SOLN: 10.0000 mg | Freq: Once | INTRAVENOUS | Status: AC

## 2019-12-04 MED ORDER — SODIUM CHLORIDE 0.9 % IV BOLUS
1000.0000 mL | Freq: Once | INTRAVENOUS | Status: AC
  Administered 2019-12-04: 1000 mL via INTRAVENOUS

## 2019-12-04 MED ORDER — ETOMIDATE 2 MG/ML IV SOLN
6.0000 mg | Freq: Once | INTRAVENOUS | Status: AC
  Administered 2019-12-04: 6 mg via INTRAVENOUS
  Filled 2019-12-04: qty 10

## 2019-12-04 MED ORDER — APIXABAN 2.5 MG PO TABS
2.5000 mg | ORAL_TABLET | Freq: Once | ORAL | Status: AC
  Administered 2019-12-04: 2.5 mg via ORAL
  Filled 2019-12-04: qty 1

## 2019-12-04 MED ORDER — DILTIAZEM HCL 25 MG/5ML IV SOLN
INTRAVENOUS | Status: AC
  Administered 2019-12-04: 10 mg via INTRAVENOUS
  Filled 2019-12-04: qty 5

## 2019-12-04 NOTE — Discharge Instructions (Addendum)
As we discussed, stop taking your aspirin and start the Eliquis.  You were given a dose for tonight so can start this in the morning tomorrow.  It is very important that you follow-up with your cardiologist as scheduled  Call Dr. Rockey Situ to discuss your ER visit and possible medication adjustments (for your metoprolol)

## 2019-12-04 NOTE — ED Notes (Signed)
Patient in sinus bradycardia. Repeat EKG performed and give to MD at bedside.

## 2019-12-04 NOTE — Patient Instructions (Signed)
Medication Instructions:  Your physician recommends that you continue on your current medications as directed. Please refer to the Current Medication list given to you today.  *If you need a refill on your cardiac medications before your next appointment, please call your pharmacy*  Follow-Up: At [facility], you and your health needs are our priority.  As part of our continuing mission to provide you with exceptional heart care, we have created designated Provider Care Teams.  These Care Teams include your primary Cardiologist (physician) and Advanced Practice Providers (APPs -  Physician Assistants and Nurse Practitioners) who all work together to provide you with the care you need, when you need it.  We recommend signing up for the patient portal called "MyChart".  Sign up information is provided on this After Visit Summary.  MyChart is used to connect with patients for Virtual Visits (Telemedicine).  Patients are able to view lab/test results, encounter notes, upcoming appointments, etc.  Non-urgent messages can be sent to your provider as well.   To learn more about what you can do with MyChart, go to [email or web address].    Your next appointment:   2 week(s)  The format for your next appointment:   In Person  Provider:   You may see Ida Rogue, MD or one of the following Advanced Practice Providers on your designated Care Team:    Murray Hodgkins, NP  Christell Faith, PA-C  Marrianne Mood, PA-C  [place], [place]

## 2019-12-04 NOTE — Telephone Encounter (Signed)
Spoke to patient. States she's been having racing HR since 2 am this morning.  She's taken metoprolol 12.5 mg x3 doses and has not felt any relief. She would like to be evaluated so that she can figure out what's going on but does not want to have to go to the ER unless necessary.  Opening at 2:45 pm today and patient states she can get here in about 15 minutes.

## 2019-12-04 NOTE — Telephone Encounter (Signed)
Patient calling back requesting to speak with a nurse.

## 2019-12-04 NOTE — ED Triage Notes (Addendum)
Pt to ED via POV. Pt sent by Dr. Rhunette Croft office due to tachycardia. Pt stating she has felt her heart racing since 0130am. Pt HR 140-145s and showing aflutter on monitor. Pt with hx aflutter. Pt stating medication or bearing down usually brings her HR down. Pt took metoprolol at 0315, 0430, and 1130a with no improvement.  Pt c/o slight HA.

## 2019-12-04 NOTE — Telephone Encounter (Signed)
STAT if HR is under 50 or over 120 (normal HR is 60-100 beats per minute)  1) What is your heart rate?130-140's  2) Do you have a log of your heart rate readings (document readings)? Yes usually 60'- 70's    3) Do you have any other symptoms?  Feels racing

## 2019-12-04 NOTE — ED Provider Notes (Signed)
[facility] Emergency Department Provider Note  ____________________________________________   First MD Initiated Contact with Patient 12/04/19 1603     (approximate)  I have reviewed the triage vital signs and the nursing notes.   HISTORY  Chief Complaint Tachycardia (aflutter)    HPI Theresa Hale is a 64 y.o. female  With PMHx AFlutter, here with palpitations. Pt arrives from Cardiology clinic.  Patient tells me that she was in her usual state of health yesterday.  No significant medication or other changes.  She states she was having a very stressful dream when suddenly she felt like she got very scared.  She then woke up and felt like her heart was racing.  She took her heart rate and it was in the 130s to 140s.  She has remained in the 140s since then, despite taking 3 additional doses of metoprolol, for total of 37.5 mg.  She has had this happen before, but was able to cardiovert after taking the medications.  She went to cardiology clinic and was sent here for possible cardioversion.  She states she feels somewhat "wobbly" but denies any overt chest pain or shortness of breath.  She has some mild lightheadedness with exertion.  No ongoing chest pain.  No other complaints.       Past Medical History:  Diagnosis Date  . Atrial flutter ([place])    a. Dx in 2018. Single episode. Not on [place] (CHA2DS2VASc = 2).  . Breast cancer ([place])   . Elevated coronary artery calcium score    a. 07/2019 Cardiac CT: Ca2+ 262 (90th %'ile).  . GERD (gastroesophageal reflux disease)   . History of echocardiogram    a. 06/2016 Echo: EF 60-65%, no rwma. Mild TR.  Marland Kitchen Personal history of radiation therapy 02/2018  . PONV (postoperative nausea and vomiting)   . PSVT (paroxysmal supraventricular tachycardia) ([place])    a. 06/2019 [facility]: RSR, avg HR 71, max HR 222. 97 runs of SVT, fastest 222 (4 beats), longest 1:05 (164).    Patient Active Problem List   Diagnosis Date Noted  .  Malignant neoplasm of upper-outer quadrant of right breast in female, estrogen receptor positive ([place]) 10/18/2017    Past Surgical History:  Procedure Laterality Date  . ABDOMINAL HYSTERECTOMY    . BREAST BIOPSY Right 10/02/2017  . BREAST EXCISIONAL BIOPSY Right 01/2002  . BREAST LUMPECTOMY Right 2019  . BREAST LUMPECTOMY WITH RADIOACTIVE SEED AND SENTINEL LYMPH NODE BIOPSY Right 11/26/2017   Procedure: RIGHT BREAST LUMPECTOMY WITH SENTINEL NODE MAPPING AND TARGETED NODE DISECTION ERAS PATHWAY;  Surgeon: Jovita Kussmaul, MD;  Location: [facility];  Service: General;  Laterality: Right;  . CESAREAN SECTION    . CHOLECYSTECTOMY    . KNEE ARTHROSCOPY    . NECK SURGERY      Prior to Admission medications   Medication Sig Start Date End Date Taking? Authorizing Provider  apixaban (ELIQUIS) 2.5 MG TABS tablet Take 1 tablet (2.5 mg total) by mouth 2 (two) times daily. 12/04/19 01/03/20  Duffy Bruce, MD  Cholecalciferol (DIALYVITE VITAMIN D 5000 PO) Take 5,000 Units by mouth daily.    [provider]  fexofenadine (ALLEGRA ALLERGY) 180 MG tablet Take 180 mg by mouth daily.     [provider]  Licorice, Glycyrrhiza glabra, (LICORICE ROOT PO) Take 1 Dose by mouth daily.    [provider]  metoprolol succinate (TOPROL XL) 25 MG 24 hr tablet Take 0.5 tablets (12.5 mg total)  by mouth at bedtime. Take as needed for fast heart rates. 08/26/19   Loel Dubonnet, NP  rosuvastatin (CRESTOR) 5 MG tablet Take 1 tablet (5 mg total) by mouth daily. 11/05/19 02/03/20  Minna Merritts, MD    Allergies Penicillins, Erythromycin, Sulfa antibiotics, and Sulfasalazine  Family History  Problem Relation Age of Onset  . Stroke Mother   . Breast cancer Mother 35  . Parkinson's disease Father   . Alzheimer's disease Father   . Breast cancer Sister 44    Social History Social History   Tobacco Use  . Smoking status: Never Smoker  . Smokeless tobacco: Never  Used  Vaping Use  . Vaping Use: Never used  Substance Use Topics  . Alcohol use: Never  . Drug use: Never    Review of Systems  Review of Systems  Constitutional: Positive for fatigue. Negative for fever.  HENT: Negative for congestion and sore throat.   Eyes: Negative for visual disturbance.  Respiratory: Negative for cough and shortness of breath.   Cardiovascular: Negative for chest pain.  Gastrointestinal: Negative for abdominal pain, diarrhea, nausea and vomiting.  Genitourinary: Negative for flank pain.  Musculoskeletal: Negative for back pain and neck pain.  Skin: Negative for rash and wound.  Neurological: Positive for light-headedness. Negative for weakness.  All other systems reviewed and are negative.    ____________________________________________  PHYSICAL EXAM:      VITAL SIGNS: ED Triage Vitals  Enc Vitals Group     BP 12/04/19 1600 (!) 130/95     Pulse Rate 12/04/19 1600 (!) 142     Resp 12/04/19 1600 (!) 23     Temp 12/04/19 1600 98.2 F (36.8 C)     Temp src --      SpO2 12/04/19 1600 98 %     Weight 12/04/19 1603 113 lb (51.3 kg)     Height 12/04/19 1603 5\' 5"  (1.651 m)     Head Circumference --      Peak Flow --      Pain Score 12/04/19 1602 3     Pain Loc --      Pain Edu? --      Excl. in [place]? --      Physical Exam Vitals and nursing note reviewed.  Constitutional:      General: She is not in acute distress.    Appearance: She is well-developed.  HENT:     Head: Normocephalic and atraumatic.  Eyes:     Conjunctiva/sclera: Conjunctivae normal.  Cardiovascular:     Rate and Rhythm: Regular rhythm. Tachycardia present.     Heart sounds: Normal heart sounds. No murmur heard.  No friction rub.  Pulmonary:     Effort: Pulmonary effort is normal. No respiratory distress.     Breath sounds: Normal breath sounds. No wheezing or rales.  Abdominal:     General: There is no distension.     Palpations: Abdomen is soft.     Tenderness: There  is no abdominal tenderness.  Musculoskeletal:     Cervical back: Neck supple.  Skin:    General: Skin is warm.     Capillary Refill: Capillary refill takes less than 2 seconds.  Neurological:     Mental Status: She is alert and oriented to person, place, and time.     Motor: No abnormal muscle tone.       ____________________________________________   LABS (all labs ordered are listed, but only abnormal results are displayed)  Labs  Reviewed  COMPREHENSIVE METABOLIC PANEL - Abnormal; Notable for the following components:      Result Value   Glucose, Bld 111 (*)    Alkaline Phosphatase 137 (*)    All other components within normal limits  BRAIN NATRIURETIC PEPTIDE - Abnormal; Notable for the following components:   B Natriuretic Peptide 385.8 (*)    All other components within normal limits  CBC WITH DIFFERENTIAL/PLATELET  MAGNESIUM  TSH    ____________________________________________  EKG: Heart rate 144, suspected a flutter with 2-1 conduction.  QRS 86, QTc 408.  No acute ST elevation or depression.  Nonspecific T wave changes are likely rate related. EKG 2: Atrial flutter, ventricular rate 73.  Nonspecific T wave changes have improved, rate markedly improved after diltiazem.  No ST elevations or depressions. ________________________________________  RADIOLOGY All imaging, including plain films, CT scans, and ultrasounds, independently reviewed by me, and interpretations confirmed via formal radiology reads.  ED MD interpretation:   Chest x-ray: Clear  Official radiology report(s): DG Chest Portable 1 View  Result Date: 12/04/2019 CLINICAL DATA:  Dyspnea EXAM: PORTABLE CHEST 1 VIEW COMPARISON:  CT 08/18/2005 FINDINGS: Lungs are well expanded, symmetric, and clear. No pneumothorax or pleural effusion. Cardiac size within normal limits. Pulmonary vascularity is normal. Osseous structures are age-appropriate. No acute bone abnormality. Surgical clips are seen within the  right breast IMPRESSION: No active disease. Electronically Signed   By: Fidela Salisbury MD   On: 12/04/2019 16:38    ____________________________________________  PROCEDURES   Procedure(s) performed (including Critical Care):  .Critical Care Performed by: Duffy Bruce, MD Authorized by: Duffy Bruce, MD   Critical care provider statement:    Critical care time (minutes):  35   Critical care time was exclusive of:  Separately billable procedures and treating other patients and teaching time   Critical care was necessary to treat or prevent imminent or life-threatening deterioration of the following conditions:  Cardiac failure and circulatory failure   Critical care was time spent personally by me on the following activities:  Development of treatment plan with patient or surrogate, discussions with consultants, evaluation of patient's response to treatment, examination of patient, obtaining history from patient or surrogate, ordering and performing treatments and interventions, ordering and review of laboratory studies, ordering and review of radiographic studies, pulse oximetry, re-evaluation of patient's condition and review of old charts   I assumed direction of critical care for this patient from another provider in my specialty: no   .Cardioversion  Date/Time: 12/04/2019 6:39 PM Performed by: Duffy Bruce, MD Authorized by: Duffy Bruce, MD   Consent:    Consent obtained:  Written   Consent given by:  Patient   Risks discussed:  Cutaneous burn, death, induced arrhythmia and pain   Alternatives discussed:  Alternative treatment Pre-procedure details:    Cardioversion basis:  Emergent   Rhythm:  Atrial flutter   Electrode placement:  Anterior-posterior Patient sedated: Yes. Refer to sedation procedure documentation for details of sedation.  Attempt one:    Cardioversion mode:  Synchronous   Waveform:  Biphasic   Shock (Joules):  100   Shock outcome:  Conversion  to normal sinus rhythm Post-procedure details:    Patient status:  Awake   Patient tolerance of procedure:  Tolerated well, no immediate complications .Sedation  Date/Time: 12/04/2019 6:40 PM Performed by: Duffy Bruce, MD Authorized by: Duffy Bruce, MD   Consent:    Consent obtained:  Verbal   Consent given by:  Patient   Risks  discussed:  Allergic reaction, dysrhythmia, inadequate sedation, nausea, prolonged hypoxia resulting in organ damage, prolonged sedation necessitating reversal, respiratory compromise necessitating ventilatory assistance and intubation and vomiting   Alternatives discussed:  Analgesia without sedation, anxiolysis and regional anesthesia Universal protocol:    Procedure explained and questions answered to patient or proxy's satisfaction: yes     Relevant documents present and verified: yes     Test results available and properly labeled: yes     Imaging studies available: yes     Required blood products, implants, devices, and special equipment available: yes     Site/side marked: yes     Immediately prior to procedure a time out was called: yes     Patient identity confirmation method:  Verbally with patient Indications:    Procedure necessitating sedation performed by:  Physician performing sedation Pre-sedation assessment:    Time since last food or drink:  4   ASA classification: class 2 - patient with mild systemic disease     Neck mobility: normal     Mouth opening:  3 or more finger widths   Thyromental distance:  4 finger widths   Mallampati score:  I - soft palate, uvula, fauces, pillars visible   Pre-sedation assessments completed and reviewed: airway patency, cardiovascular function, hydration status, mental status, nausea/vomiting, pain level, respiratory function and temperature   Immediate pre-procedure details:    Reassessment: Patient reassessed immediately prior to procedure     Reviewed: vital signs, relevant labs/tests and NPO  status     Verified: bag valve mask available, emergency equipment available, intubation equipment available, IV patency confirmed, oxygen available and suction available   Procedure details (see MAR for exact dosages):    Preoxygenation:  Nasal cannula   Sedation:  Etomidate   Intended level of sedation: moderate (conscious sedation)   Intra-procedure monitoring:  Blood pressure monitoring, cardiac monitor, continuous pulse oximetry, frequent LOC assessments, frequent vital sign checks and continuous capnometry   Intra-procedure events: none     Total Provider sedation time (minutes):  15 Post-procedure details:    Attendance: Constant attendance by certified staff until patient recovered     Recovery: Patient returned to pre-procedure baseline     Post-sedation assessments completed and reviewed: airway patency, cardiovascular function, hydration status, mental status, nausea/vomiting, pain level, respiratory function and temperature     Patient is stable for discharge or admission: yes     Patient tolerance:  Tolerated well, no immediate complications    ____________________________________________  INITIAL IMPRESSION / MDM / ASSESSMENT AND PLAN / ED COURSE  As part of my medical decision making, I reviewed the following data within the [place] notes reviewed and incorporated, Old chart reviewed, Notes from prior ED visits, and [place] Controlled Substance Database       *YEN WANDELL was evaluated in Emergency Department on 12/04/2019 for the symptoms described in the history of present illness. She was evaluated in the context of the global COVID-19 pandemic, which necessitated consideration that the patient might be at risk for infection with the SARS-CoV-2 virus that causes COVID-19. Institutional protocols and algorithms that pertain to the evaluation of patients at risk for COVID-19 are in a state of rapid change based on information released by regulatory  bodies including the CDC and federal and state organizations. These policies and algorithms were followed during the patient's care in the ED.  Some ED evaluations and interventions may be delayed as a result of limited staffing during the  pandemic.*     Medical Decision Making: Very pleasant 64 year old female here with a flutter with 2-1 conduction.  Patient has a history of SVT, as well as what sounds like one episode of a flutter in the past.  Patient has not tolerated beta-blockers in the past due to hypotension.  I reviewed her recent visits, as well as cardiology note from today.  She was given a low dose of diltiazem with improvement in rate, but no resolution of her flutter.  After an informed consent discussion, as well as discussion with Dr. Rockey Situ, decision made to cardiovert.  She tolerated the procedure very well.  She does have a CHA2DS2-VASc score of 2, so will start on Eliquis, refer for outpatient cardiology follow-up.  She remained in normal sinus rhythm after her cardioversion, is well-appearing, and do not feel she needs admission at this time.  She was able to ambulate without return of her flutter.  Screening labs obtained and are reassuring.  No significant electrolyte abnormalities.  She is not anemic.  No high risk features for Eliquis.  ____________________________________________  FINAL CLINICAL IMPRESSION(S) / ED DIAGNOSES  Final diagnoses:  Typical atrial flutter (HCC)     MEDICATIONS GIVEN DURING THIS VISIT:  Medications  apixaban (ELIQUIS) tablet 2.5 mg (has no administration in time range)  diltiazem (CARDIZEM) injection 10 mg (10 mg Intravenous Given 12/04/19 1621)  sodium chloride 0.9 % bolus 1,000 mL (0 mLs Intravenous Stopped 12/04/19 1717)  etomidate (AMIDATE) injection 6 mg (6 mg Intravenous Given 12/04/19 1704)     ED Discharge Orders         Ordered    apixaban (ELIQUIS) 2.5 MG TABS tablet  2 times daily        12/04/19 1837           Note:   This document was prepared using Dragon voice recognition software and may include unintentional dictation errors.   Duffy Bruce, MD 12/04/19 [phone]

## 2019-12-04 NOTE — ED Notes (Signed)
Patient placed on cardiac monitor, zoll defibrillator pads and ETCO2 in preparation for cardioversion. Patient has peripheral IV acces. BVM in room available. MD explained procedure/risks/benefits to patient. Consent sighed by patient/husband. Patient A&Ox3.

## 2019-12-04 NOTE — Progress Notes (Signed)
Cardiology Clinic Note   Patient Name: Theresa Hale Date of Encounter: 12/04/2019  Primary Care Provider:  Caryl Bis, MD Primary Cardiologist:  Ida Rogue, MD  Patient Profile    64 year old female with a prior history of atrial flutter, breast cancer, elevated coronary calcium score, hyperlipidemia, GERD, and PSVT, who presents as an urgent add-on related to atrial flutter with rapid ventricular response.  Past Medical History    Past Medical History:  Diagnosis Date  . Atrial flutter ([place])    a. Dx in 2018. Single episode. Not on [place] (CHA2DS2VASc = 2).  . Breast cancer ([place])   . Elevated coronary artery calcium score    a. 07/2019 Cardiac CT: Ca2+ 262 (90th %'ile).  . GERD (gastroesophageal reflux disease)   . History of echocardiogram    a. 06/2016 Echo: EF 60-65%, no rwma. Mild TR.  Marland Kitchen Personal history of radiation therapy 02/2018  . PONV (postoperative nausea and vomiting)   . PSVT (paroxysmal supraventricular tachycardia) ([place])    a. 06/2019 Zio: RSR, avg HR 71, max HR 222. 97 runs of SVT, fastest 222 (4 beats), longest 1:05 (164).   Past Surgical History:  Procedure Laterality Date  . ABDOMINAL HYSTERECTOMY    . BREAST BIOPSY Right 10/02/2017  . BREAST EXCISIONAL BIOPSY Right 01/2002  . BREAST LUMPECTOMY Right 2019  . BREAST LUMPECTOMY WITH RADIOACTIVE SEED AND SENTINEL LYMPH NODE BIOPSY Right 11/26/2017   Procedure: RIGHT BREAST LUMPECTOMY WITH SENTINEL NODE MAPPING AND TARGETED NODE DISECTION ERAS PATHWAY;  Surgeon: Jovita Kussmaul, MD;  Location: [facility];  Service: General;  Laterality: Right;  . CESAREAN SECTION    . CHOLECYSTECTOMY    . KNEE ARTHROSCOPY    . NECK SURGERY      Allergies  Allergies  Allergen Reactions  . Penicillins Swelling  . Erythromycin Nausea And Vomiting  . Sulfa Antibiotics Nausea And Vomiting  . Sulfasalazine Nausea And Vomiting    History of Present Illness    64 year old female with a history of  atrial flutter, breast cancer, elevated coronary calcium score, hyperlipidemia, GERD, and PSVT.  She previously had an episode of atrial flutter in 2018, that was first identified by her primary care provider.  She was placed on Toprol-XL 25 mg and after 2 doses, she converted spontaneously to sinus rhythm.  She was seen by cardiology in [place] (Dr. Harl Bowie) at the time and in the setting of a CHA2DS2-VASc of 1, she was placed on aspirin only.  Echocardiogram at the time showed an EF of 60-65% with mild tricuspid regurgitation.  She is done well from the standpoint of atrial flutter over the years without any recurrences but has been having tachypalpitations resulting in placement of a Zio monitor in May of this year which showed 97 runs of PSVT, the fastest of which was 222 bpm x4 beats and the longest of which was 1 minute and 5 seconds.  Usage of beta-blocker has been limited by relative hypotension and therefore, she only takes it as needed.  She underwent cardiac CT for calcium scoring in the setting of mild hyperlipidemia.  This was abnormal with a calcium score of 262, placing her in the 90th percentile.  She has been managed with rosuvastatin 5 mg daily and her LDL was 52 in July.  She was recently seen in cardiology clinic on October 12 with complaints of ongoing tachypalpitations and intolerance to beta-blockers given soft blood pressures, limiting therapy.  She was referred to EP  for consideration of catheter ablation.  This morning, she says she awoke from bedroom at approximately 1:30 AM with recurrent tachypalpitations which have been persistent ever since.  She has taken a total of 37.5 mg of Toprol XL without any change in symptoms or heart rate.  She has been fairly steady between 137 and 142 bpm at home.  Because of ongoing tachycardia and subsequent development of profound fatigue, she contacted our office and was added onto my schedule today.  She is currently in atrial flutter at a rate of  142 bpm.  She is not experiencing any chest pain or dyspnea but does feel "wobbly."  Home Medications    Prior to Admission medications   Medication Sig Start Date End Date Taking? Authorizing Provider  aspirin EC 81 MG tablet Take 1 tablet (81 mg total) by mouth daily. Swallow whole. 08/22/19   Loel Dubonnet, NP  Cholecalciferol (DIALYVITE VITAMIN D 5000 PO) Take 5,000 Units by mouth daily.    [provider]  esomeprazole (NEXIUM) 40 MG capsule Take 40 mg by mouth daily at 12 noon.  05/19/19   [provider]  fexofenadine (ALLEGRA ALLERGY) 180 MG tablet Take 180 mg by mouth daily.     [provider]  Licorice, Glycyrrhiza glabra, (LICORICE ROOT PO) Take 1 Dose by mouth daily.    [provider]  metoprolol succinate (TOPROL XL) 25 MG 24 hr tablet Take 0.5 tablets (12.5 mg total) by mouth at bedtime. Take as needed for fast heart rates. 08/26/19   Loel Dubonnet, NP  rosuvastatin (CRESTOR) 5 MG tablet Take 1 tablet (5 mg total) by mouth daily. 11/05/19 02/03/20  Minna Merritts, MD    Family History    Family History  Problem Relation Age of Onset  . Stroke Mother   . Breast cancer Mother 93  . Parkinson's disease Father   . Alzheimer's disease Father   . Breast cancer Sister 62   She indicated that her mother is deceased. She indicated that her father is deceased. She indicated that her sister is alive.  Social History    Social History   Socioeconomic History  . Marital status: Married    Spouse name: Not on file  . Number of children: Not on file  . Years of education: Not on file  . Highest education level: Not on file  Occupational History  . Not on file  Tobacco Use  . Smoking status: Never Smoker  . Smokeless tobacco: Never Used  Vaping Use  . Vaping Use: Never used  Substance and Sexual Activity  . Alcohol use: Never  . Drug use: Never  . Sexual activity: Not on file  Other Topics Concern  . Not on file  Social  History Narrative   Lives locally w/ husband. Active.   Social Determinants of Health   Financial Resource Strain:   . Difficulty of Paying Living Expenses: Not on file  Food Insecurity:   . Worried About Charity fundraiser in the Last Year: Not on file  . Ran Out of Food in the Last Year: Not on file  Transportation Needs:   . Lack of Transportation (Medical): Not on file  . Lack of Transportation (Non-Medical): Not on file  Physical Activity:   . Days of Exercise per Week: Not on file  . Minutes of Exercise per Session: Not on file  Stress:   . Feeling of Stress : Not on file  Social Connections:   .  Frequency of Communication with Friends and Family: Not on file  . Frequency of Social Gatherings with Friends and Family: Not on file  . Attends Religious Services: Not on file  . Active Member of Clubs or Organizations: Not on file  . Attends Archivist Meetings: Not on file  . Marital Status: Not on file  Intimate Partner Violence:   . Fear of Current or Ex-Partner: Not on file  . Emotionally Abused: Not on file  . Physically Abused: Not on file  . Sexually Abused: Not on file     Review of Systems    General:  +++ fatigue. No chills, fever, night sweats or weight changes.  Cardiovascular:  No chest pain, dyspnea on exertion, edema, orthopnea, +++ palpitations, +++ mild lightheadedness, no paroxysmal nocturnal dyspnea. Dermatological: No rash, lesions/masses Respiratory: No cough, dyspnea Urologic: No hematuria, dysuria Abdominal:   No nausea, vomiting, diarrhea, bright red blood per rectum, melena, or hematemesis Neurologic:  No visual changes, wkns, changes in mental status. All other systems reviewed and are otherwise negative except as noted above.  Physical Exam    VS:  BP 130/84 (BP Location: Left Arm, Patient Position: Sitting, Cuff Size: Normal)   Pulse (!) 142   Ht 5\' 5"  (1.651 m)   Wt 113 lb (51.3 kg)   SpO2 97%   BMI 18.80 kg/m  , BMI Body  mass index is 18.8 kg/m. GEN: Well nourished, well developed, in no acute distress. HEENT: normal. Neck: Supple, no JVD, carotid bruits, or masses. Cardiac: RRR, tachycardic, no murmurs, rubs, or gallops. No clubbing, cyanosis, edema.  Radials/PT 2+ and equal bilaterally.  Respiratory:  Respirations regular and unlabored, clear to auscultation bilaterally. GI: Soft, nontender, nondistended, BS + x 4. MS: no deformity or atrophy. Skin: warm and dry, no rash. Neuro:  Strength and sensation are intact. Psych: Normal affect.  Accessory Clinical Findings    ECG personally reviewed by me today-atrial flutter, 142, rightward axis, nonspecific ST and T changes- No acute changes  Assessment & Plan   1.  Atrial flutter with rapid ventricular response: Patient presented to clinic today with a 13-hour history of persistent tachycardia and finding of atrial flutter at a rate of 142 bpm.  She had already tried multiple vagal maneuvers and taken a total of 37.5 mg of Toprol XL throughout the day.  She has had fairly significant fatigue but is not experiencing chest pain or dyspnea.  Blood pressure stable at 130/84.  I suspect she will require cardioversion and our best option to get this performed quickly would be through the emergency department, especially as duration of symptoms/arrhythmia is currently less than 24 hours.  Our staff will over to the emergency department now for lab work, anticoagulation, and potentially initiation of intravenous diltiazem if blood pressure will allow.  If for some reason cardioversion cannot be carried out in the ER, recommend initiation of anticoagulation and n.p.o. after midnight for our team to evaluate and arrange for cardioversion tomorrow.  She already has EP follow-up on November 2 to address SVT and hopefully she will be a suitable candidate for flutter ablation as well.  As her CHA2DS2-VASc is now 2, she will need oral anticoagulation started.  2.  PSVT: Previously  noted on monitoring in May of this year.  PSVT is generally short-lived, lasting no more than 1 minute and 5 seconds on monitoring.  She does not tolerate daily AV nodal blocking agent secondary to hypotension and as above, scheduled to  see EP November 2.  3.  Elevated coronary calcium score/hyperlipidemia: Continue statin therapy.  LDL was 52 in July of this year.  4.  Disposition: Patient to be referred to the ED for management of atrial flutter with rapid ventricular response.  If deemed to be appropriate, she would benefit from cardioversion in the ER tonight with initiation of oral anticoagulation.  Follow-up with EP November 2.  Murray Hodgkins, NP 12/04/2019, 3:54 PM

## 2019-12-04 NOTE — ED Notes (Signed)
Patient ambulated in room, HR between 80-90 while ambulating, stayed in NSR at all time. MD aware.

## 2019-12-04 NOTE — ED Notes (Signed)
Patient cardioverted at [place].

## 2019-12-09 ENCOUNTER — Encounter (INDEPENDENT_AMBULATORY_CARE_PROVIDER_SITE_OTHER): Payer: Self-pay | Admitting: *Deleted

## 2019-12-14 NOTE — Progress Notes (Signed)
Cardiology Office Note  Date:  12/15/2019   ID:  CHENEE MUNNS, DOB August 10, 1955, MRN [id number]  PCP:  Caryl Bis, MD   Chief Complaint  Patient presents with  . Follow-up    Cardioversion. Meds reviewed by the pt. verbally. "doing well."     HPI:  [name] is a 64 year old woman with past medical history of Breast cancer Atrial flutter in 05/2016, 10/21 HTN CT coronary calcium score 262 Long history of paroxysmal tachycardia, symptomatic flutter and SVT Intolerant of rate and rhythm controlling medications secondary to hypotension Who presents for routine follow-up of her tachycardia, hx of atrial flutter  Seen by Dr. Harl Bowie, cardiology, in 05/2016 for atrial flutter Seen in our clinic May 2021 Was seen by one of our providers July 2021 Seen by myself November 25, 2019 Developed atrial flutter with RVR, 1:45 Am, seen in the clinic December 04, 2019 Was sent to the emergency room, underwent cardioversion 6:45 pm  Started on anticoagulation She has follow-up with EP December 16, 2019  She is only on Eliquis 2.5 twice daily (started by the emergency room) Given normal renal function, age less than 53, should be on 5 twice daily  H/a pain , right temple since the cardioversion Comes and goes  Prior event monitor reviewed with her, short runs of narrow complex tachycardia  EKG personally reviewed by myself on todays visit Shows normal sinus rhythm rate 66 bpm no significant ST-T wave changes   Other past medical history reviewed Echo 2018  Left ventricle: The cavity size was normal. Wall thickness was  normal. Systolic function was normal. The estimated ejection  fraction was in the range of 60% to 65%. Wall motion was normal;  there were no regional wall motion abnormalities. There was an  increased relative contribution of atrial contraction to  ventricular filling. Left ventricular diastolic function  parameters were normal.  - Aortic valve: Mildly  calcified annulus. Probably trileaflet.  - Mitral valve: Mildly calcified annulus.  - Tricuspid valve: There was mild regurgitation.  03/29/2019 Near syncope Went down on the ground, etiology unclear   PMH:   has a past medical history of Atrial flutter ([place]), Breast cancer ([place]), Elevated coronary artery calcium score, GERD (gastroesophageal reflux disease), History of echocardiogram, Personal history of radiation therapy (02/2018), PONV (postoperative nausea and vomiting), and PSVT (paroxysmal supraventricular tachycardia) ([place]).  PSH:    Past Surgical History:  Procedure Laterality Date  . ABDOMINAL HYSTERECTOMY    . BREAST BIOPSY Right 10/02/2017  . BREAST EXCISIONAL BIOPSY Right 01/2002  . BREAST LUMPECTOMY Right 2019  . BREAST LUMPECTOMY WITH RADIOACTIVE SEED AND SENTINEL LYMPH NODE BIOPSY Right 11/26/2017   Procedure: RIGHT BREAST LUMPECTOMY WITH SENTINEL NODE MAPPING AND TARGETED NODE DISECTION ERAS PATHWAY;  Surgeon: Jovita Kussmaul, MD;  Location: [facility];  Service: General;  Laterality: Right;  . CESAREAN SECTION    . CHOLECYSTECTOMY    . KNEE ARTHROSCOPY    . NECK SURGERY      Current Outpatient Medications  Medication Sig Dispense Refill  . apixaban (ELIQUIS) 2.5 MG TABS tablet Take 1 tablet (2.5 mg total) by mouth 2 (two) times daily. 60 tablet 0  . Cholecalciferol (DIALYVITE VITAMIN D 5000 PO) Take 5,000 Units by mouth daily.    . fexofenadine (ALLEGRA ALLERGY) 180 MG tablet Take 180 mg by mouth daily.     . Licorice, Glycyrrhiza glabra, (LICORICE ROOT PO) Take 1 Dose by mouth daily.    Marland Kitchen  metoprolol succinate (TOPROL XL) 25 MG 24 hr tablet Take 0.5 tablets (12.5 mg total) by mouth at bedtime. Take as needed for fast heart rates. 15 tablet 6  . rosuvastatin (CRESTOR) 5 MG tablet Take 1 tablet (5 mg total) by mouth daily. 90 tablet 3  . Zinc 30 MG TABS Take 30 mg by mouth daily.      No current facility-administered medications for this visit.      Allergies:   Penicillins, Erythromycin, Sulfa antibiotics, and Sulfasalazine   Social History:  The patient  reports that she has never smoked. She has never used smokeless tobacco. She reports that she does not drink alcohol and does not use drugs.   Family History:   family history includes Alzheimer's disease in her father; Breast cancer (age of onset: 55) in her sister; Breast cancer (age of onset: 59) in her mother; Parkinson's disease in her father; Stroke in her mother.    Review of Systems: Review of Systems  Constitutional: Negative.   HENT: Negative.   Respiratory: Negative.   Cardiovascular:       Tachycardia  Gastrointestinal: Negative.   Musculoskeletal: Negative.   Neurological: Positive for dizziness and headaches.  Psychiatric/Behavioral: Negative.   All other systems reviewed and are negative.   PHYSICAL EXAM: VS:  BP 140/82 (BP Location: Left Arm, Patient Position: Sitting, Cuff Size: Normal)   Pulse 66   Ht 5\' 5"  (1.651 m)   Wt 114 lb 8 oz (51.9 kg)   SpO2 99%   BMI 19.05 kg/m  , BMI Body mass index is 19.05 kg/m. Constitutional:  oriented to person, place, and time. No distress.  HENT:  Head: Grossly normal Eyes:  no discharge. No scleral icterus.  Neck: No JVD, no carotid bruits  Cardiovascular: Regular rate and rhythm, no murmurs appreciated Pulmonary/Chest: Clear to auscultation bilaterally, no wheezes or rails Abdominal: Soft.  no distension.  no tenderness.  Musculoskeletal: Normal range of motion Neurological:  normal muscle tone. Coordination normal. No atrophy Skin: Skin warm and dry Psychiatric: normal affect, pleasant   Recent Labs: 12/04/2019: ALT 28; B Natriuretic Peptide 385.8; BUN 19; Creatinine, Ser 0.76; Hemoglobin 14.6; Magnesium 2.1; Platelets 229; Potassium 3.8; Sodium 137; TSH 3.740    Lipid Panel No results found for: CHOL, HDL, LDLCALC, TRIG    Wt Readings from Last 3 Encounters:  12/15/19 114 lb 8 oz (51.9 kg)   12/04/19 113 lb (51.3 kg)  12/04/19 113 lb (51.3 kg)     ASSESSMENT AND PLAN:  Problem List Items Addressed This Visit    None    Visit Diagnoses    Typical atrial flutter (HCC)    -  Primary   PSVT (paroxysmal supraventricular tachycardia) ([facility])       Coronary artery calcification seen on CT scan       Hyperlipidemia LDL goal <70       Lightheadedness       Benign essential HTN         Atrial flutter Recurrence, was sent to the emergency room last month December 04, 2019, cardioversion in the emergency room  Was in flutter for 17 hours, very symptomatic Prior episode April 2018 Also with other arrhythmia as below Scheduled to see EP tomorrow, she would like to consider ablation  SVT  noted on event monitor Was symptomatic with episodes, rate average 160, max rate 220 bpm Difficult time tolerating higher dose medication secondary to hypotension  Dizziness Associated with her tachycardia episodes Likely exacerbating her  low blood pressure  Coronary calcification Coronary calcification, calcium score 260 continue low-dose Crestor We will request lipid panel from primary care  Headache On the right, comes and goes past several days Reports it is been going on since cardioversion Recommend if symptoms persist that she contact primary care, rule out temporal arteritis    total encounter time more than 25 minutes  Greater than 50% was spent in counseling and coordination of care with the patient    Signed, Esmond Plants, M.D., Ph.D. [place], [place]

## 2019-12-15 ENCOUNTER — Other Ambulatory Visit: Payer: Self-pay

## 2019-12-15 ENCOUNTER — Encounter: Payer: Self-pay | Admitting: Cardiovascular Disease

## 2019-12-15 ENCOUNTER — Ambulatory Visit (INDEPENDENT_AMBULATORY_CARE_PROVIDER_SITE_OTHER): Payer: Medicare PPO | Admitting: Cardiovascular Disease

## 2019-12-15 VITALS — BP 140/82 | HR 66 | Ht 65.0 in | Wt 114.5 lb

## 2019-12-15 DIAGNOSIS — E785 Hyperlipidemia, unspecified: Secondary | ICD-10-CM | POA: Diagnosis not present

## 2019-12-15 DIAGNOSIS — I2510 Atherosclerotic heart disease of native coronary artery without angina pectoris: Secondary | ICD-10-CM | POA: Diagnosis not present

## 2019-12-15 DIAGNOSIS — R42 Dizziness and giddiness: Secondary | ICD-10-CM | POA: Diagnosis not present

## 2019-12-15 DIAGNOSIS — I483 Typical atrial flutter: Secondary | ICD-10-CM | POA: Diagnosis not present

## 2019-12-15 DIAGNOSIS — I471 Supraventricular tachycardia: Secondary | ICD-10-CM | POA: Diagnosis not present

## 2019-12-15 DIAGNOSIS — I10 Essential (primary) hypertension: Secondary | ICD-10-CM

## 2019-12-15 NOTE — Patient Instructions (Signed)
Medication Instructions:  No changes  If you need a refill on your cardiac medications before your next appointment, please call your pharmacy.    Lab work: No new labs needed   If you have labs (blood work) drawn today and your tests are completely normal, you will receive your results only by: . MyChart Message (if you have MyChart) OR . A paper copy in the mail If you have any lab test that is abnormal or we need to change your treatment, we will call you to review the results.   Testing/Procedures: No new testing needed   Follow-Up: At [facility], you and your health needs are our priority.  As part of our continuing mission to provide you with exceptional heart care, we have created designated Provider Care Teams.  These Care Teams include your primary Cardiologist (physician) and Advanced Practice Providers (APPs -  Physician Assistants and Nurse Practitioners) who all work together to provide you with the care you need, when you need it.  . You will need a follow up appointment in 6 months  . Providers on your designated Care Team:   . Christopher Berge, NP . Ryan Dunn, PA-C . Jacquelyn Visser, PA-C  Any Other Special Instructions Will Be Listed Below (If Applicable).  COVID-19 Vaccine Information can be found at: [email or web address] For questions related to vaccine distribution or appointments, please email [email or web address] or call [phone].     

## 2019-12-16 ENCOUNTER — Ambulatory Visit: Payer: Medicare PPO | Admitting: Cardiology

## 2019-12-16 ENCOUNTER — Telehealth: Payer: Self-pay | Admitting: Cardiology

## 2019-12-16 ENCOUNTER — Other Ambulatory Visit (INDEPENDENT_AMBULATORY_CARE_PROVIDER_SITE_OTHER): Payer: Medicare PPO

## 2019-12-16 ENCOUNTER — Encounter: Payer: Self-pay | Admitting: Cardiology

## 2019-12-16 VITALS — BP 126/82 | HR 78 | Ht 65.0 in | Wt 115.0 lb

## 2019-12-16 DIAGNOSIS — I4892 Unspecified atrial flutter: Secondary | ICD-10-CM | POA: Diagnosis not present

## 2019-12-16 DIAGNOSIS — I483 Typical atrial flutter: Secondary | ICD-10-CM

## 2019-12-16 MED ORDER — APIXABAN 5 MG PO TABS: 5.0000 mg | ORAL_TABLET | Freq: Two times a day (BID) | ORAL | 6 refills | Status: DC

## 2019-12-16 NOTE — Progress Notes (Signed)
Electrophysiology Office Note   Date:  12/16/2019   ID:  Theresa Hale, DOB [birth year], MRN [id number]  PCP:  Caryl Bis, MD  Cardiologist:  Rockey Situ Primary Electrophysiologist:  Zeppelin Commisso Meredith Leeds, MD    Chief Complaint: Atrial flutter   History of Present Illness: Theresa Hale is a 64 y.o. female who is being seen today for the evaluation of atrial flutter at the request of Gollan, Kathlene November, MD. Presenting today for electrophysiology evaluation.  She has a history significant for atrial flutter, and SVT.  She also has breast cancer, hypertension, and an elevated coronary calcium score.  She developed atrial flutter December 04, 2019.  She went to the emergency room and underwent cardioversion.  She was started on anticoagulation. She has had 2 episodes of atrial flutter, but multiple episodes of SVT. Usually the SVT episodes last a minute at a time and she is able to terminate them with Valsalva maneuvers.  First episode of atrial flutter, she took a dose and a half of metoprolol which terminated that, but she did require cardioversion in the emergency room after her second episode.  Today, she denies symptoms of palpitations, chest pain, shortness of breath, orthopnea, PND, lower extremity edema, claudication, dizziness, presyncope, syncope, bleeding, or neurologic sequela. The patient is tolerating medications without difficulties.    Past Medical History:  Diagnosis Date  . Atrial flutter ([place])    a. Dx in 2018. Single episode. Not on [place] (CHA2DS2VASc = 2).  . Breast cancer ([place])   . Elevated coronary artery calcium score    a. 07/2019 Cardiac CT: Ca2+ 262 (90th %'ile).  . GERD (gastroesophageal reflux disease)   . History of echocardiogram    a. 06/2016 Echo: EF 60-65%, no rwma. Mild TR.  Marland Kitchen Personal history of radiation therapy 02/2018  . PONV (postoperative nausea and vomiting)   . PSVT (paroxysmal supraventricular tachycardia) ([place])    a. 06/2019 Zio: RSR, avg HR 71,  max HR 222. 97 runs of SVT, fastest 222 (4 beats), longest 1:05 (164).   Past Surgical History:  Procedure Laterality Date  . ABDOMINAL HYSTERECTOMY    . BREAST BIOPSY Right 10/02/2017  . BREAST EXCISIONAL BIOPSY Right 01/2002  . BREAST LUMPECTOMY Right 2019  . BREAST LUMPECTOMY WITH RADIOACTIVE SEED AND SENTINEL LYMPH NODE BIOPSY Right 11/26/2017   Procedure: RIGHT BREAST LUMPECTOMY WITH SENTINEL NODE MAPPING AND TARGETED NODE DISECTION ERAS PATHWAY;  Surgeon: Jovita Kussmaul, MD;  Location: [facility];  Service: General;  Laterality: Right;  . CESAREAN SECTION    . CHOLECYSTECTOMY    . KNEE ARTHROSCOPY    . NECK SURGERY       Current Outpatient Medications  Medication Sig Dispense Refill  . Cholecalciferol (DIALYVITE VITAMIN D 5000 PO) Take 5,000 Units by mouth daily.    . fexofenadine (ALLEGRA ALLERGY) 180 MG tablet Take 180 mg by mouth daily.     . Licorice, Glycyrrhiza glabra, (LICORICE ROOT PO) Take 1 Dose by mouth daily.    . metoprolol succinate (TOPROL XL) 25 MG 24 hr tablet Take 0.5 tablets (12.5 mg total) by mouth at bedtime. Take as needed for fast heart rates. 15 tablet 6  . rosuvastatin (CRESTOR) 5 MG tablet Take 1 tablet (5 mg total) by mouth daily. 90 tablet 3  . Zinc 30 MG TABS Take 30 mg by mouth daily.     Marland Kitchen apixaban (ELIQUIS) 5 MG TABS tablet Take 1 tablet (5 mg total) by mouth 2 (  two) times daily. 60 tablet 6   No current facility-administered medications for this visit.    Allergies:   Penicillins, Erythromycin, Sulfa antibiotics, and Sulfasalazine   Social History:  The patient  reports that she has never smoked. She has never used smokeless tobacco. She reports that she does not drink alcohol and does not use drugs.   Family History:  The patient's family history includes Alzheimer's disease in her father; Breast cancer (age of onset: 43) in her sister; Breast cancer (age of onset: 5) in her mother; Parkinson's disease in her father; Stroke in  her mother.    ROS:  Please see the history of present illness.   Otherwise, review of systems is positive for none.   All other systems are reviewed and negative.    PHYSICAL EXAM: VS:  BP 126/82   Pulse 78   Ht 5\' 5"  (1.651 m)   Wt 115 lb (52.2 kg)   SpO2 98%   BMI 19.14 kg/m  , BMI Body mass index is 19.14 kg/m. GEN: Well nourished, well developed, in no acute distress  HEENT: normal  Neck: no JVD, carotid bruits, or masses Cardiac: RRR; no murmurs, rubs, or gallops,no edema  Respiratory:  clear to auscultation bilaterally, normal work of breathing GI: soft, nontender, nondistended, + BS MS: no deformity or atrophy  Skin: warm and dry Neuro:  Strength and sensation are intact Psych: euthymic mood, full affect  EKG:  EKG is not ordered today. Personal review of the ekg ordered 12/04/19 shows atrial flutter  Recent Labs: 12/04/2019: ALT 28; B Natriuretic Peptide 385.8; BUN 19; Creatinine, Ser 0.76; Hemoglobin 14.6; Magnesium 2.1; Platelets 229; Potassium 3.8; Sodium 137; TSH 3.740    Lipid Panel  No results found for: CHOL, TRIG, HDL, CHOLHDL, VLDL, LDLCALC, LDLDIRECT   Wt Readings from Last 3 Encounters:  12/16/19 115 lb (52.2 kg)  12/15/19 114 lb 8 oz (51.9 kg)  12/04/19 113 lb (51.3 kg)      Other studies Reviewed: Additional studies/ records that were reviewed today include: TTE 06/21/16  Review of the above records today demonstrates:  - Left ventricle: The cavity size was normal. Wall thickness was  normal. Systolic function was normal. The estimated ejection  fraction was in the range of 60% to 65%. Wall motion was normal;  there were no regional wall motion abnormalities. There was an  increased relative contribution of atrial contraction to  ventricular filling. Left ventricular diastolic function  parameters were normal.  - Aortic valve: Mildly calcified annulus. Probably trileaflet.  - Mitral valve: Mildly calcified annulus.  - Tricuspid  valve: There was mild regurgitation.   Cardiac monitor 08/13/2019 personally reviewed Normal sinus rhythm avg HR of 71 bpm. max HR of 222 bpm  97 Supraventricular Tachycardia runs occurred, the run with the fastest interval lasting 4 beats with a max rate of 222 bpm, the longest lasting 1 min 5 secs with an avg rate of 164 bpm.  Supraventricular Tachycardia was detected within +/- 45 seconds of symptomatic patient event(s).   Junctional Rhythm was present.   Isolated SVEs were rare (<1.0%), SVE Couplets were rare (<1.0%), and SVE Triplets were rare (<1.0%). Isolated VEs were rare (<1.0%), VE Couplets were rare (<1.0%), and no VE Triplets were present.  ASSESSMENT AND PLAN:  1.  Atrial flutter: Appears to be typical.  Had a recurrence and went to the emergency room 12/04/2019 requiring cardioversion.  She was quite symptomatic with this episode.  She would she would  like to avoid antiarrhythmic medications.  Due to that, we Wayburn Shaler plan for ablation.  Risks and benefits were discussed which include bleeding, vascular damage, tamponade, heart block, stroke, damage to chest organs.  She understands these risks and has agreed to the procedure.  2.  SVT: Noted on cardiac monitor with heart rates in the 160s to 220.  We Daiel Strohecker do a full EP study during the atrial flutter ablation.  Case discussed with primary cardiology  Current medicines are reviewed at length with the patient today.   The patient does not have concerns regarding her medicines.  The following changes were made today:  none  Labs/ tests ordered today include:  No orders of the defined types were placed in this encounter.    Disposition:   FU with Mylei Brackeen 3 months  Signed, Elexius Minar Meredith Leeds, MD  12/16/2019 12:27 PM     [facility] [address] [place] [address] [phone] (office) [phone] (fax)

## 2019-12-16 NOTE — H&P (View-Only) (Signed)
Electrophysiology Office Note   Date:  12/16/2019   ID:  Theresa Hale, DOB September 12, 1955, MRN [id number]  PCP:  Caryl Bis, MD  Cardiologist:  Rockey Situ Primary Electrophysiologist:  Winferd Wease Meredith Leeds, MD    Chief Complaint: Atrial flutter   History of Present Illness: Theresa Hale is a 64 y.o. female who is being seen today for the evaluation of atrial flutter at the request of Gollan, Kathlene November, MD. Presenting today for electrophysiology evaluation.  She has a history significant for atrial flutter, and SVT.  She also has breast cancer, hypertension, and an elevated coronary calcium score.  She developed atrial flutter December 04, 2019.  She went to the emergency room and underwent cardioversion.  She was started on anticoagulation. She has had 2 episodes of atrial flutter, but multiple episodes of SVT. Usually the SVT episodes last a minute at a time and she is able to terminate them with Valsalva maneuvers.  First episode of atrial flutter, she took a dose and a half of metoprolol which terminated that, but she did require cardioversion in the emergency room after her second episode.  Today, she denies symptoms of palpitations, chest pain, shortness of breath, orthopnea, PND, lower extremity edema, claudication, dizziness, presyncope, syncope, bleeding, or neurologic sequela. The patient is tolerating medications without difficulties.    Past Medical History:  Diagnosis Date  . Atrial flutter ([place])    a. Dx in 2018. Single episode. Not on [place] (CHA2DS2VASc = 2).  . Breast cancer ([place])   . Elevated coronary artery calcium score    a. 07/2019 Cardiac CT: Ca2+ 262 (90th %'ile).  . GERD (gastroesophageal reflux disease)   . History of echocardiogram    a. 06/2016 Echo: EF 60-65%, no rwma. Mild TR.  Marland Kitchen Personal history of radiation therapy 02/2018  . PONV (postoperative nausea and vomiting)   . PSVT (paroxysmal supraventricular tachycardia) ([place])    a. 06/2019 Zio: RSR, avg HR 71,  max HR 222. 97 runs of SVT, fastest 222 (4 beats), longest 1:05 (164).   Past Surgical History:  Procedure Laterality Date  . ABDOMINAL HYSTERECTOMY    . BREAST BIOPSY Right 10/02/2017  . BREAST EXCISIONAL BIOPSY Right 01/2002  . BREAST LUMPECTOMY Right 2019  . BREAST LUMPECTOMY WITH RADIOACTIVE SEED AND SENTINEL LYMPH NODE BIOPSY Right 11/26/2017   Procedure: RIGHT BREAST LUMPECTOMY WITH SENTINEL NODE MAPPING AND TARGETED NODE DISECTION ERAS PATHWAY;  Surgeon: Jovita Kussmaul, MD;  Location: [facility];  Service: General;  Laterality: Right;  . CESAREAN SECTION    . CHOLECYSTECTOMY    . KNEE ARTHROSCOPY    . NECK SURGERY       Current Outpatient Medications  Medication Sig Dispense Refill  . Cholecalciferol (DIALYVITE VITAMIN D 5000 PO) Take 5,000 Units by mouth daily.    . fexofenadine (ALLEGRA ALLERGY) 180 MG tablet Take 180 mg by mouth daily.     . Licorice, Glycyrrhiza glabra, (LICORICE ROOT PO) Take 1 Dose by mouth daily.    . metoprolol succinate (TOPROL XL) 25 MG 24 hr tablet Take 0.5 tablets (12.5 mg total) by mouth at bedtime. Take as needed for fast heart rates. 15 tablet 6  . rosuvastatin (CRESTOR) 5 MG tablet Take 1 tablet (5 mg total) by mouth daily. 90 tablet 3  . Zinc 30 MG TABS Take 30 mg by mouth daily.     Marland Kitchen apixaban (ELIQUIS) 5 MG TABS tablet Take 1 tablet (5 mg total) by mouth 2 (  two) times daily. 60 tablet 6   No current facility-administered medications for this visit.    Allergies:   Penicillins, Erythromycin, Sulfa antibiotics, and Sulfasalazine   Social History:  The patient  reports that she has never smoked. She has never used smokeless tobacco. She reports that she does not drink alcohol and does not use drugs.   Family History:  The patient's family history includes Alzheimer's disease in her father; Breast cancer (age of onset: 75) in her sister; Breast cancer (age of onset: 30) in her mother; Parkinson's disease in her father; Stroke in  her mother.    ROS:  Please see the history of present illness.   Otherwise, review of systems is positive for none.   All other systems are reviewed and negative.    PHYSICAL EXAM: VS:  BP 126/82   Pulse 78   Ht 5\' 5"  (1.651 m)   Wt 115 lb (52.2 kg)   SpO2 98%   BMI 19.14 kg/m  , BMI Body mass index is 19.14 kg/m. GEN: Well nourished, well developed, in no acute distress  HEENT: normal  Neck: no JVD, carotid bruits, or masses Cardiac: RRR; no murmurs, rubs, or gallops,no edema  Respiratory:  clear to auscultation bilaterally, normal work of breathing GI: soft, nontender, nondistended, + BS MS: no deformity or atrophy  Skin: warm and dry Neuro:  Strength and sensation are intact Psych: euthymic mood, full affect  EKG:  EKG is not ordered today. Personal review of the ekg ordered 12/04/19 shows atrial flutter  Recent Labs: 12/04/2019: ALT 28; B Natriuretic Peptide 385.8; BUN 19; Creatinine, Ser 0.76; Hemoglobin 14.6; Magnesium 2.1; Platelets 229; Potassium 3.8; Sodium 137; TSH 3.740    Lipid Panel  No results found for: CHOL, TRIG, HDL, CHOLHDL, VLDL, LDLCALC, LDLDIRECT   Wt Readings from Last 3 Encounters:  12/16/19 115 lb (52.2 kg)  12/15/19 114 lb 8 oz (51.9 kg)  12/04/19 113 lb (51.3 kg)      Other studies Reviewed: Additional studies/ records that were reviewed today include: TTE 06/21/16  Review of the above records today demonstrates:  - Left ventricle: The cavity size was normal. Wall thickness was  normal. Systolic function was normal. The estimated ejection  fraction was in the range of 60% to 65%. Wall motion was normal;  there were no regional wall motion abnormalities. There was an  increased relative contribution of atrial contraction to  ventricular filling. Left ventricular diastolic function  parameters were normal.  - Aortic valve: Mildly calcified annulus. Probably trileaflet.  - Mitral valve: Mildly calcified annulus.  - Tricuspid  valve: There was mild regurgitation.   Cardiac monitor 08/13/2019 personally reviewed Normal sinus rhythm avg HR of 71 bpm. max HR of 222 bpm  97 Supraventricular Tachycardia runs occurred, the run with the fastest interval lasting 4 beats with a max rate of 222 bpm, the longest lasting 1 min 5 secs with an avg rate of 164 bpm.  Supraventricular Tachycardia was detected within +/- 45 seconds of symptomatic patient event(s).   Junctional Rhythm was present.   Isolated SVEs were rare (<1.0%), SVE Couplets were rare (<1.0%), and SVE Triplets were rare (<1.0%). Isolated VEs were rare (<1.0%), VE Couplets were rare (<1.0%), and no VE Triplets were present.  ASSESSMENT AND PLAN:  1.  Atrial flutter: Appears to be typical.  Had a recurrence and went to the emergency room 12/04/2019 requiring cardioversion.  She was quite symptomatic with this episode.  She would she would  like to avoid antiarrhythmic medications.  Due to that, we Xeng Kucher plan for ablation.  Risks and benefits were discussed which include bleeding, vascular damage, tamponade, heart block, stroke, damage to chest organs.  She understands these risks and has agreed to the procedure.  2.  SVT: Noted on cardiac monitor with heart rates in the 160s to 220.  We Torrie Lafavor do a full EP study during the atrial flutter ablation.  Case discussed with primary cardiology  Current medicines are reviewed at length with the patient today.   The patient does not have concerns regarding her medicines.  The following changes were made today:  none  Labs/ tests ordered today include:  No orders of the defined types were placed in this encounter.    Disposition:   FU with Ashlei Chinchilla 3 months  Signed, Faigy Stretch Meredith Leeds, MD  12/16/2019 12:27 PM     [facility] [address] [place] [address] [phone] (office) [phone] (fax)

## 2019-12-16 NOTE — Telephone Encounter (Signed)
Patient calling to speak with [name]. States she just has a quick question.

## 2019-12-16 NOTE — Patient Instructions (Addendum)
Medication Instructions:  Your physician has recommended you make the following change in your medication:  1. INCREASE Eliquis to 5 mg twice daily  *If you need a refill on your cardiac medications before your next appointment, please call your pharmacy*   Lab Work: None ordered   Testing/Procedures: Your physician has recommended that you have an ablation. Catheter ablation is a medical procedure used to treat some cardiac arrhythmias (irregular heartbeats). During catheter ablation, a long, thin, flexible tube is put into a blood vessel in your groin (upper thigh), or neck. This tube is called an ablation catheter. It is then guided to your heart through the blood vessel. Radio frequency waves destroy small areas of heart tissue where abnormal heartbeats may cause an arrhythmia to start. Please see the instructions below located under "other instructions".   Follow-Up: At [facility], you and your health needs are our priority.  As part of our continuing mission to provide you with exceptional heart care, we have created designated Provider Care Teams.  These Care Teams include your primary Cardiologist (physician) and Advanced Practice Providers (APPs -  Physician Assistants and Nurse Practitioners) who all work together to provide you with the care you need, when you need it.  Your next appointment:   4 week(s) after your ablation on 11/10  The format for your next appointment:   In Person  Provider:   Allegra Lai, MD    Thank you for choosing [facility]!!   Trinidad Curet, RN [phone]   Other Instructions    Electrophysiology/Ablation Procedure Instructions   You are scheduled for a(n)  ablation on 12/24/19 with Dr. Allegra Lai.   1.   Pre procedure testing-             A.  LAB WORK --- On 10/21 for your pre procedure blood work.                 B. COVID TEST-- On 12/22/19 @ 11:00 am - This is a Drive Up Visit at [employer] at [place] will direct you to the appropriate testing line. Stay in your car and someone will be with you shortly.   After you are tested please go home and self quarantine until the day of your procedure.     2. On the day of your procedure 12/24/19 you will go to [facility] [phone]) at 12:30 pm.  Dennis Bast will go to the main entrance A [employer]) and enter where the [employer] are.  Your driver will drop you off and you will head down the hallway to ADMITTING.  You may have one support person come in to the hospital with you.  They will be asked to wait in the waiting room.   3.   Do not eat or drink after midnight prior to your procedure.   4.   Do not miss any doses of your blood thinner prior to the morning of your procedure or your procedure will need to be rescheduled.       Do NOT take any medications the morning of your procedure.   5.  Plan for an overnight stay, but you may be discharged home after your procedure.    If you use your phone frequently bring your phone charger, in case you have to stay.  If you are discharged after your procedure you will need someone to drive you home and be with your for  24 hours after your procedure.   6. You will follow up with Dr. Curt Bears  1 month after your procedure.  These appointments will be made for you.   * If you have ANY questions please call the office [phone] and ask for Hensley Treat RN or send me a MyChart message   * Occasionally, EP Studies and ablations can become lengthy.  Please make your family aware of this before your procedure starts.  Average time ranges from 2-8 hours for EP studies/ablations.  Your physician will call your family after the procedure with the results.                                    Cardiac Ablation  Cardiac ablation is a procedure to stop some heart tissue from causing problems. The heart has many electrical connections. Sometimes these connections make the heart beat very fast or  irregularly. Removing some problem areas can improve the heart rhythm or make it normal. What happens before the procedure?  Follow instructions from your doctor about what you cannot eat or drink.  Ask your doctor about: ? Changing or stopping your normal medicines. This is important if you take diabetes medicines or blood thinners. ? Taking medicines such as aspirin and ibuprofen. These medicines can thin your blood. Do not take these medicines before your procedure if your doctor tells you not to.  Plan to have someone take you home.  If you will be going home right after the procedure, plan to have someone with you for 24 hours. What happens during the procedure?  To lower your risk of infection: ? Your health care team will wash or sanitize their hands. ? Your skin will be washed with soap. ? Hair may be removed from your neck or groin.  An IV tube will be put into one of your veins.  You will be given a medicine to help you relax (sedative).  Skin on your neck or groin will be numbed.  A cut (incision) will be made in your neck or groin.  A needle will be put through your cut and into a vein in your neck or groin.  A tube (catheter) will be put into the needle. The tube will be moved to your heart. X-rays (fluoroscopy) will be used to help guide the tube.  Small devices (electrodes) on the tip of the tube will send out electrical currents.  Dye may be put through the tube. This helps your surgeon see your heart.  Electrical energy will be used to scar (ablate) some heart tissue. Your surgeon may use: ? Heat (radiofrequency energy). ? Laser energy. ? Extreme cold (cryoablation).  The tube will be taken out.  Pressure will be held on your cut. This helps stop bleeding.  A bandage (dressing) will be put on your cut. The procedure may vary. What happens after the procedure?  You will be monitored until your medicines have worn off.  Your cut will be watched for  bleeding. You will need to lie still for a few hours.  Do not drive for 24 hours or as long as your doctor tells you. Summary  Cardiac ablation is a procedure to stop some heart tissue from causing problems.  Electrical energy will be used to scar (ablate) some heart tissue. This information is not intended to replace advice given to you by your health care provider. Make sure you discuss  any questions you have with your health care provider. Document Revised: 01/12/2017 Document Reviewed: 12/20/2015 Elsevier Patient Education  2020 [employer].

## 2019-12-16 NOTE — Telephone Encounter (Signed)
Returned pt call. Answered question. Informed that procedure for 11/10 needs to be moved to 11/12 as we are unable to do procedure on 10th. Pt agreeable to moving procedure date.  Rescheduled Covid screening. Rescheduled procedure date/time. Patient verbalized understanding and agreeable to plan.

## 2019-12-21 ENCOUNTER — Ambulatory Visit
Admission: RE | Admit: 2019-12-21 | Discharge: 2019-12-21 | Disposition: A | Discharge status: Home / Self Care | Payer: Medicare PPO | Source: Ambulatory Visit | Attending: Family Medicine | Admitting: Family Medicine

## 2019-12-21 ENCOUNTER — Other Ambulatory Visit: Payer: Self-pay

## 2019-12-21 DIAGNOSIS — R413 Other amnesia: Secondary | ICD-10-CM | POA: Diagnosis not present

## 2019-12-21 DIAGNOSIS — R9082 White matter disease, unspecified: Secondary | ICD-10-CM | POA: Diagnosis not present

## 2019-12-22 ENCOUNTER — Other Ambulatory Visit: Payer: Medicare PPO

## 2019-12-24 ENCOUNTER — Other Ambulatory Visit: Payer: Self-pay

## 2019-12-24 ENCOUNTER — Other Ambulatory Visit
Admission: RE | Admit: 2019-12-24 | Discharge: 2019-12-24 | Disposition: A | Discharge status: Home / Self Care | Payer: Medicare PPO | Source: Ambulatory Visit | Attending: Cardiology | Admitting: Cardiology

## 2019-12-24 ENCOUNTER — Ambulatory Visit: Payer: Medicare PPO | Admitting: Nurse Practitioner

## 2019-12-24 DIAGNOSIS — Z01812 Encounter for preprocedural laboratory examination: Secondary | ICD-10-CM | POA: Diagnosis not present

## 2019-12-24 DIAGNOSIS — Z20822 Contact with and (suspected) exposure to covid-19: Secondary | ICD-10-CM | POA: Insufficient documentation

## 2019-12-25 LAB — SARS CORONAVIRUS 2 (TAT 6-24 HRS): SARS Coronavirus 2: NEGATIVE

## 2019-12-25 NOTE — Progress Notes (Signed)
Instructed patient on the following items: Arrival time 1130 Nothing to eat or drink after midnight No meds AM of procedure Responsible person to drive you home and stay with you for 24 hrs  Have you missed any doses of anti-coagulant Eliquis- no doses missed

## 2019-12-26 ENCOUNTER — Other Ambulatory Visit: Payer: Self-pay

## 2019-12-26 ENCOUNTER — Encounter (HOSPITAL_COMMUNITY)
Admission: RE | Disposition: A | Discharge status: Home / Self Care | Payer: Self-pay | Source: Home / Self Care | Attending: Cardiology

## 2019-12-26 ENCOUNTER — Ambulatory Visit (HOSPITAL_COMMUNITY)
Admission: RE | Admit: 2019-12-26 | Discharge: 2019-12-26 | Disposition: A | Discharge status: Home / Self Care | Payer: Medicare PPO | Attending: Cardiology | Admitting: Cardiology

## 2019-12-26 ENCOUNTER — Ambulatory Visit (HOSPITAL_COMMUNITY): Payer: Medicare PPO | Admitting: Certified Registered Nurse Anesthetist

## 2019-12-26 DIAGNOSIS — Z7901 Long term (current) use of anticoagulants: Secondary | ICD-10-CM | POA: Insufficient documentation

## 2019-12-26 DIAGNOSIS — Z881 Allergy status to other antibiotic agents status: Secondary | ICD-10-CM | POA: Diagnosis not present

## 2019-12-26 DIAGNOSIS — I10 Essential (primary) hypertension: Secondary | ICD-10-CM | POA: Diagnosis not present

## 2019-12-26 DIAGNOSIS — I471 Supraventricular tachycardia: Secondary | ICD-10-CM | POA: Insufficient documentation

## 2019-12-26 DIAGNOSIS — Z882 Allergy status to sulfonamides status: Secondary | ICD-10-CM | POA: Insufficient documentation

## 2019-12-26 DIAGNOSIS — I4892 Unspecified atrial flutter: Secondary | ICD-10-CM | POA: Diagnosis not present

## 2019-12-26 DIAGNOSIS — C50411 Malignant neoplasm of upper-outer quadrant of right female breast: Secondary | ICD-10-CM | POA: Diagnosis not present

## 2019-12-26 DIAGNOSIS — Z79899 Other long term (current) drug therapy: Secondary | ICD-10-CM | POA: Insufficient documentation

## 2019-12-26 DIAGNOSIS — Z880 Allergy status to penicillin: Secondary | ICD-10-CM | POA: Diagnosis not present

## 2019-12-26 DIAGNOSIS — K219 Gastro-esophageal reflux disease without esophagitis: Secondary | ICD-10-CM | POA: Diagnosis not present

## 2019-12-26 DIAGNOSIS — Z170 Estrogen receptor positive status [ER+]: Secondary | ICD-10-CM | POA: Diagnosis not present

## 2019-12-26 HISTORY — PX: A-FLUTTER ABLATION: EP1230

## 2019-12-26 SURGERY — A-FLUTTER ABLATION
Anesthesia: General

## 2019-12-26 MED ORDER — SUGAMMADEX SODIUM 200 MG/2ML IV SOLN
INTRAVENOUS | Status: DC | PRN
  Administered 2019-12-26: 110 mg via INTRAVENOUS

## 2019-12-26 MED ORDER — ROCURONIUM BROMIDE 10 MG/ML (PF) SYRINGE
PREFILLED_SYRINGE | INTRAVENOUS | Status: DC | PRN
  Administered 2019-12-26: 40 mg via INTRAVENOUS

## 2019-12-26 MED ORDER — ONDANSETRON HCL 4 MG/2ML IJ SOLN: 4.0000 mg | Freq: Four times a day (QID) | INTRAMUSCULAR | Status: DC | PRN

## 2019-12-26 MED ORDER — DEXAMETHASONE SODIUM PHOSPHATE 10 MG/ML IJ SOLN
INTRAMUSCULAR | Status: DC | PRN
  Administered 2019-12-26: 5 mg via INTRAVENOUS

## 2019-12-26 MED ORDER — LIDOCAINE 2% (20 MG/ML) 5 ML SYRINGE
INTRAMUSCULAR | Status: DC | PRN
  Administered 2019-12-26: 20 mg via INTRAVENOUS
  Administered 2019-12-26: 40 mg via INTRAVENOUS

## 2019-12-26 MED ORDER — SODIUM CHLORIDE 0.9 % IV SOLN: INTRAVENOUS | Status: DC

## 2019-12-26 MED ORDER — HEPARIN (PORCINE) IN NACL 1000-0.9 UT/500ML-% IV SOLN
INTRAVENOUS | Status: AC
  Filled 2019-12-26: qty 1000

## 2019-12-26 MED ORDER — SODIUM CHLORIDE 0.9% FLUSH: 3.0000 mL | Freq: Two times a day (BID) | INTRAVENOUS | Status: DC

## 2019-12-26 MED ORDER — PROPOFOL 10 MG/ML IV BOLUS
INTRAVENOUS | Status: DC | PRN
  Administered 2019-12-26: 30 mg via INTRAVENOUS
  Administered 2019-12-26: 100 mg via INTRAVENOUS

## 2019-12-26 MED ORDER — DIPHENHYDRAMINE HCL 50 MG/ML IJ SOLN
INTRAMUSCULAR | Status: DC | PRN
  Administered 2019-12-26: 12.5 mg via INTRAVENOUS

## 2019-12-26 MED ORDER — FENTANYL CITRATE (PF) 250 MCG/5ML IJ SOLN
INTRAMUSCULAR | Status: DC | PRN
  Administered 2019-12-26: 50 ug via INTRAVENOUS
  Administered 2019-12-26: 25 ug via INTRAVENOUS

## 2019-12-26 MED ORDER — HEPARIN (PORCINE) IN NACL 1000-0.9 UT/500ML-% IV SOLN
INTRAVENOUS | Status: DC | PRN
  Administered 2019-12-26 (×2): 500 mL

## 2019-12-26 MED ORDER — ONDANSETRON HCL 4 MG/2ML IJ SOLN
INTRAMUSCULAR | Status: DC | PRN
  Administered 2019-12-26: 4 mg via INTRAVENOUS

## 2019-12-26 MED ORDER — SODIUM CHLORIDE 0.9 % IV SOLN: 250.0000 mL | INTRAVENOUS | Status: DC | PRN

## 2019-12-26 MED ORDER — PHENYLEPHRINE HCL-NACL 10-0.9 MG/250ML-% IV SOLN
INTRAVENOUS | Status: DC | PRN
  Administered 2019-12-26: 20 ug/min via INTRAVENOUS

## 2019-12-26 MED ORDER — MIDAZOLAM HCL 5 MG/5ML IJ SOLN
INTRAMUSCULAR | Status: DC | PRN
  Administered 2019-12-26: 2 mg via INTRAVENOUS

## 2019-12-26 MED ORDER — EPHEDRINE SULFATE-NACL 50-0.9 MG/10ML-% IV SOSY
PREFILLED_SYRINGE | INTRAVENOUS | Status: DC | PRN
  Administered 2019-12-26: 5 mg via INTRAVENOUS
  Administered 2019-12-26: 10 mg via INTRAVENOUS

## 2019-12-26 MED ORDER — ACETAMINOPHEN 325 MG PO TABS
650.0000 mg | ORAL_TABLET | ORAL | Status: DC | PRN
  Filled 2019-12-26: qty 2

## 2019-12-26 MED ORDER — SODIUM CHLORIDE 0.9% FLUSH: 3.0000 mL | INTRAVENOUS | Status: DC | PRN

## 2019-12-26 SURGICAL SUPPLY — 19 items
BLANKET WARM UNDERBOD FULL ACC (MISCELLANEOUS) ×2 IMPLANT
CATH 8FR REPROCESSED SOUNDSTAR (CATHETERS) ×3 IMPLANT
CATH 8FR SOUNDSTAR REPROCESSED (CATHETERS) IMPLANT
CATH EZ STEER NAV 8MM D-F CUR (ABLATOR) ×2 IMPLANT
CATH EZ STEER NAV 8MM F-J CUR (ABLATOR) ×2 IMPLANT
CATH WEBSTER BI DIR CS D-F CRV (CATHETERS) ×4 IMPLANT
CLOSURE PERCLOSE PROSTYLE (VASCULAR PRODUCTS) ×4 IMPLANT
COVER SWIFTLINK CONNECTOR (BAG) ×2 IMPLANT
PACK EP LATEX FREE (CUSTOM PROCEDURE TRAY) ×3
PACK EP LF (CUSTOM PROCEDURE TRAY) ×1 IMPLANT
PAD PRO RADIOLUCENT 2001M-C (PAD) ×3 IMPLANT
PATCH CARTO3 (PAD) ×2 IMPLANT
SHEATH BAYLIS SUREFLEX  M 8.5 (SHEATH) ×3
SHEATH BAYLIS SUREFLEX M 8.5 (SHEATH) IMPLANT
SHEATH PINNACLE 7F 10CM (SHEATH) ×2 IMPLANT
SHEATH PINNACLE 8F 10CM (SHEATH) ×2 IMPLANT
SHEATH PINNACLE 9F 10CM (SHEATH) ×2 IMPLANT
SHEATH PROBE COVER 6X72 (BAG) ×2 IMPLANT
SHEATH SWARTZ RAMP 8.5F 60CM (SHEATH) ×2 IMPLANT

## 2019-12-26 NOTE — Progress Notes (Signed)
Up and walked and tolerated well; right groin stable, no bleeding or hematoma 

## 2019-12-26 NOTE — Interval H&P Note (Signed)
History and Physical Interval Note:  12/26/2019 1:05 PM  Theresa Hale  has presented today for surgery, with the diagnosis of aflutter.  The various methods of treatment have been discussed with the patient and family. After consideration of risks, benefits and other options for treatment, the patient has consented to  Procedure(s): A-FLUTTER ABLATION (N/A) as a surgical intervention.  The patient's history has been reviewed, patient examined, no change in status, stable for surgery.  I have reviewed the patient's chart and labs.  Questions were answered to the patient's satisfaction.     Pernell Dikes [employer]

## 2019-12-26 NOTE — Anesthesia Procedure Notes (Signed)
Procedure Name: Intubation Date/Time: 12/26/2019 1:57 PM Performed by: Milford Cage, CRNA Pre-anesthesia Checklist: Patient identified, Emergency Drugs available, Suction available and Patient being monitored Patient Re-evaluated:Patient Re-evaluated prior to induction Oxygen Delivery Method: Circle System Utilized Preoxygenation: Pre-oxygenation with 100% oxygen Induction Type: IV induction Ventilation: Mask ventilation without difficulty Laryngoscope Size: Miller and 2 Grade View: Grade I Tube type: Oral Tube size: 6.5 mm Number of attempts: 1 Airway Equipment and Method: Stylet Placement Confirmation: ETT inserted through vocal cords under direct vision,  positive ETCO2 and breath sounds checked- equal and bilateral Secured at: 21 cm Tube secured with: Tape Dental Injury: Teeth and Oropharynx as per pre-operative assessment

## 2019-12-26 NOTE — Transfer of Care (Signed)
Immediate Anesthesia Transfer of Care Note  Patient: Theresa Hale  Procedure(s) Performed: A-FLUTTER ABLATION (N/A )  Patient Location: PACU  Anesthesia Type:General  Level of Consciousness: awake  Airway & Oxygen Therapy: Patient Spontanous Breathing  Post-op Assessment: Report given to RN and Post -op Vital signs reviewed and stable  Post vital signs: Reviewed and stable  Last Vitals:  Vitals Value Taken Time  BP 145/57 12/26/19 1627  Temp 36.6 C 12/26/19 1629  Pulse 82 12/26/19 1630  Resp 17 12/26/19 1630  SpO2 100 % 12/26/19 1630  Vitals shown include unvalidated device data.  Last Pain:  Vitals:   12/26/19 1629  TempSrc: Temporal  PainSc:       Patients Stated Pain Goal: 2 (56/15/37 9432)  Complications: No complications documented.

## 2019-12-26 NOTE — Anesthesia Preprocedure Evaluation (Signed)
Anesthesia Evaluation  Patient identified by MRN, date of birth, ID band Patient awake    Reviewed: Allergy & Precautions, NPO status , Patient's Chart, lab work & pertinent test results  History of Anesthesia Complications (+) PONV and history of anesthetic complications  Airway Mallampati: II  TM Distance: >3 FB Neck ROM: Full    Dental no notable dental hx. (+) Dental Advisory Given   Pulmonary neg pulmonary ROS,    Pulmonary exam normal        Cardiovascular + CAD  Normal cardiovascular exam+ dysrhythmias Atrial Fibrillation      Neuro/Psych negative neurological ROS  negative psych ROS   GI/Hepatic Neg liver ROS, GERD  ,  Endo/Other  negative endocrine ROS  Renal/GU negative Renal ROS  negative genitourinary   Musculoskeletal negative musculoskeletal ROS (+)   Abdominal   Peds negative pediatric ROS (+)  Hematology negative hematology ROS (+)   Anesthesia Other Findings   Reproductive/Obstetrics negative OB ROS                             Anesthesia Physical  Anesthesia Plan  ASA: III  Anesthesia Plan: General   Post-op Pain Management:    Induction: Intravenous  PONV Risk Score and Plan: 4 or greater and Ondansetron, Dexamethasone, Diphenhydramine, Treatment may vary due to age or medical condition and Droperidol  Airway Management Planned: Oral ETT  Additional Equipment:   Intra-op Plan:   Post-operative Plan: Extubation in OR  Informed Consent: I have reviewed the patients History and Physical, chart, labs and discussed the procedure including the risks, benefits and alternatives for the proposed anesthesia with the patient or authorized representative who has indicated his/her understanding and acceptance.     Dental advisory given  Plan Discussed with: CRNA, Anesthesiologist and Surgeon  Anesthesia Plan Comments:         Anesthesia Quick  Evaluation

## 2019-12-26 NOTE — Discharge Instructions (Signed)
Cardiac Ablation, Care After This sheet gives you information about how to care for yourself after your procedure. Your health care provider may also give you more specific instructions. If you have problems or questions, contact your health care provider. What can I expect after the procedure? After the procedure, it is common to have:  Bruising around your puncture site.  Tenderness around your puncture site.  Skipped heartbeats.  Tiredness (fatigue). Follow these instructions at home: Puncture site care   Follow instructions from your health care provider about how to take care of your puncture site. Make sure you: ? Wash your hands with soap and water before you change your bandage (dressing). If soap and water are not available, use hand sanitizer. ? Change your dressing as told by your health care provider. ? Leave stitches (sutures), skin glue, or adhesive strips in place. These skin closures may need to stay in place for up to 2 weeks. If adhesive strip edges start to loosen and curl up, you may trim the loose edges. Do not remove adhesive strips completely unless your health care provider tells you to do that.  Check your puncture site every day for signs of infection. Check for: ? Redness, swelling, or pain. ? Fluid or blood. If your puncture site starts to bleed, lie down on your back, apply firm pressure to the area, and contact your health care provider. ? Warmth. ? Pus or a bad smell.  Activity:Avoid activities that take a lot of effort for at least 3 days after your procedure.  Do not lift anything that is heavier than 10 lb (4.5 kg), or the limit that you are told, until your health care provider says that it is safe.  Return to your normal activities as told by your health care provider. Ask your health care provider what activities are safe for you. General instructions  Take over-the-counter and prescription medicines only as told by your health care  provider.  Do not use any products that contain nicotine or tobacco, such as cigarettes and e-cigarettes. If you need help quitting, ask your health care provider.  Do not take baths, swim, or use a hot tub until your health care provider approves.  Do not drink alcohol for 24 hours after your procedure.  Keep all follow-up visits as told by your health care provider. This is important. Contact a health care provider if:  You have redness, mild swelling, or pain around your puncture site.  You have fluid or blood coming from your puncture site that stops after applying firm pressure to the area.  Your puncture site feels warm to the touch.  You have pus or a bad smell coming from your puncture site.  You have a fever.  You have chest pain or discomfort that spreads to your neck, jaw, or arm.  You are sweating a lot.  You feel nauseous.  You have a fast or irregular heartbeat.  You have shortness of breath.  You are dizzy or light-headed and feel the need to lie down.  You have pain or numbness in the arm or leg closest to your puncture site. Get help right away if:  Your puncture site suddenly swells.  Your puncture site is bleeding and the bleeding does not stop after applying firm pressure to the area. These symptoms may represent a serious problem that is an emergency. Do not wait to see if the symptoms will go away. Get medical help right away. Call your local emergency services (  911 in the [place] Do not drive yourself to the hospital. Summary  After the procedure, it is normal to have bruising and tenderness at the puncture site in your groin, neck, or forearm.  Check your puncture site every day for signs of infection.  Get help right away if your puncture site is bleeding and the bleeding does not stop after applying firm pressure to the area. This is a medical emergency. This information is not intended to replace advice given to you by your health care  provider. Make sure you discuss any questions you have with your health care provider. Document Revised: 01/12/2017 Document Reviewed: 05/11/2016 Elsevier Patient Education  [place] procedure care instructions No driving for 4 days. No lifting over 5 lbs for 1 week. No vigorous or sexual activity for 1 week. You may return to work/your usual activities on 01/02/2020. Keep procedure site clean & dry. If you notice increased pain, swelling, bleeding or pus, call/return!  You may shower, but no soaking baths/hot tubs/pools for 1 week.

## 2019-12-29 ENCOUNTER — Encounter (HOSPITAL_COMMUNITY): Payer: Self-pay | Admitting: Cardiology

## 2019-12-29 ENCOUNTER — Other Ambulatory Visit: Payer: Self-pay | Admitting: *Deleted

## 2019-12-29 DIAGNOSIS — Z170 Estrogen receptor positive status [ER+]: Secondary | ICD-10-CM

## 2019-12-29 DIAGNOSIS — C50411 Malignant neoplasm of upper-outer quadrant of right female breast: Secondary | ICD-10-CM

## 2019-12-29 NOTE — Progress Notes (Signed)
Received call from pt requesting orders for breast MRI be placed for [facility] and not [facility]. Orders updated in epic for pt to receive breast MRI in 1 months at [facility].

## 2019-12-29 NOTE — Anesthesia Postprocedure Evaluation (Signed)
Anesthesia Post Note  Patient: Theresa Hale  Procedure(s) Performed: A-FLUTTER ABLATION (N/A )     Patient location during evaluation: PACU Anesthesia Type: General Level of consciousness: awake and alert Pain management: pain level controlled Vital Signs Assessment: post-procedure vital signs reviewed and stable Respiratory status: spontaneous breathing, nonlabored ventilation, respiratory function stable and patient connected to nasal cannula oxygen Cardiovascular status: blood pressure returned to baseline and stable Postop Assessment: no apparent nausea or vomiting Anesthetic complications: no   No complications documented.  Last Vitals:  Vitals:   12/26/19 1800 12/26/19 1830  BP: (!) 158/65 (!) 158/65  Pulse: 76 76  Resp: 15 17  Temp:    SpO2: 97% 97%    Last Pain:  Vitals:   12/26/19 1659  TempSrc: Temporal  PainSc: 0-No pain                 Theresa Hale Theresa Hale

## 2020-01-02 ENCOUNTER — Telehealth: Payer: Self-pay | Admitting: Cardiology

## 2020-01-02 NOTE — Telephone Encounter (Signed)
Patient states the back of her knee is swollen and she would like to know if she can take aleve for it.

## 2020-01-02 NOTE — Telephone Encounter (Signed)
Advised ok to take Aleve occasionally.  Aware her bleeding risk will be increased when taking. Pt appreciates the return call.

## 2020-01-06 NOTE — Telephone Encounter (Signed)
Patient calling back in to report blood pressure is still high through out the day. Patient last check and it was 142/77 at 12:30

## 2020-01-06 NOTE — Telephone Encounter (Signed)
Discuss w/ pt, advised to follow up with PCP about BPs (aware recent ablation and her elevated BP is not related), per Dr. Curt Bears. Pt agreeable to plan.

## 2020-01-12 ENCOUNTER — Other Ambulatory Visit: Payer: Self-pay

## 2020-01-12 ENCOUNTER — Other Ambulatory Visit
Admission: RE | Admit: 2020-01-12 | Discharge: 2020-01-12 | Disposition: A | Discharge status: Home / Self Care | Payer: Medicare PPO | Attending: Family Medicine | Admitting: Family Medicine

## 2020-01-12 DIAGNOSIS — K7460 Unspecified cirrhosis of liver: Secondary | ICD-10-CM | POA: Diagnosis not present

## 2020-01-12 LAB — FERRITIN: Ferritin: 109 ng/mL (ref 11–307)

## 2020-01-12 LAB — HEPATITIS A ANTIBODY, TOTAL: hep A Total Ab: NONREACTIVE

## 2020-01-12 LAB — HEPATITIS C ANTIBODY: HCV Ab: NONREACTIVE

## 2020-01-12 LAB — SEDIMENTATION RATE: Sed Rate: 16 mm/hr (ref 0–30)

## 2020-01-12 LAB — HEPATITIS B SURFACE ANTIGEN: Hepatitis B Surface Ag: NONREACTIVE

## 2020-01-13 LAB — ANA: Anti Nuclear Antibody (ANA): POSITIVE — AB

## 2020-01-13 LAB — HEPATITIS B SURFACE ANTIBODY, QUANTITATIVE: Hep B S AB Quant (Post): 5.6 m[IU]/mL — ABNORMAL LOW (ref >9.9)

## 2020-01-13 LAB — ANTI-SMOOTH MUSCLE ANTIBODY, IGG: F-Actin IgG: 5 Units (ref 0–19)

## 2020-01-13 LAB — ALPHA-1 ANTITRYPSIN PHENOTYPE: A-1 Antitrypsin, Ser: 185 mg/dL (ref 101–187)

## 2020-01-13 LAB — CERULOPLASMIN: Ceruloplasmin: 30.1 mg/dL (ref 19.0–39.0)

## 2020-01-13 LAB — MITOCHONDRIAL ANTIBODIES: Mitochondrial M2 Ab, IgG: <20 Units (ref 0.0–20.0)

## 2020-01-13 LAB — RHEUMATOID FACTOR: Rheumatoid fact SerPl-aCnc: <10 IU/mL (ref <14.0)

## 2020-01-15 ENCOUNTER — Ambulatory Visit (INDEPENDENT_AMBULATORY_CARE_PROVIDER_SITE_OTHER): Payer: Medicare PPO | Admitting: Gastroenterology

## 2020-01-15 ENCOUNTER — Other Ambulatory Visit: Payer: Self-pay

## 2020-01-15 ENCOUNTER — Encounter (INDEPENDENT_AMBULATORY_CARE_PROVIDER_SITE_OTHER): Payer: Self-pay | Admitting: Gastroenterology

## 2020-01-15 DIAGNOSIS — R748 Abnormal levels of other serum enzymes: Secondary | ICD-10-CM

## 2020-01-15 DIAGNOSIS — K760 Fatty (change of) liver, not elsewhere classified: Secondary | ICD-10-CM | POA: Insufficient documentation

## 2020-01-15 NOTE — Patient Instructions (Signed)
Patient was counseled about the benefit of implementing a Mediterranean diet and exercise.

## 2020-01-15 NOTE — Progress Notes (Signed)
Maylon Peppers, M.D. Gastroenterology & Hepatology [facility] [address] [place], [place] [address] Primary Care Physician: Caryl Bis, MD [place] [id number]  Referring MD: PCP  I will communicate my assessment and recommendations to the referring MD via EMR. Note: Occasional unusual wording and randomly placed punctuation marks may result from the use of speech recognition technology to transcribe this document"  Chief Complaint: Elevated alkaline phosphatase and fatty liver  History of Present Illness: Theresa Hale is a 64 y.o. female with PMH breast cancer 2019 s/p lumpectomy and lymphadenectomy with XRT, PSVT, atrial fibrillation on Eliquis, GERD,  who presents for evaluation of elevated alkaline phosphatase and fatty liver.  Patient had initial labs performed during her physical exam by her PCP and was found to have elevated alkaline phosphatase, for which she was referred for clinic.  I received the report of her liver enzymes from 10/28/2019 which showed alkaline phosphatase of 163, AST was 31, ALT 25, total bilirubin 0.7, albumin 4.7, sodium was 138, potassium 4.5, creatinine 0.84 and BUN 15.  She had a fractionated portion of the alkaline phosphatase which showed a value of 157 with liver fraction 60% and bone fraction 35%, intestinal fraction was only 5%.  The patient reports that she had a liver ultrasound performed due to these abnormalities and was found to have liver steatosis but no report is available at the moment.Due to this, the patient underwent further testing by her PCP which included ESR, rheumatoid factor, ASMA, AMA, alpha-1 antitrypsin, ferritin, ceruloplasmin, hepatitis B surface antigen and hepatitis C antibody which were all negative.  She had positive ANAs but titers were not available.  No IgG was available.  Also, hepatitis A total antibody was nonreactive.  She had positive hepatitis B  antibodies but they were below 10 (5.6).  Her most recent CMP was from 12/04/2019 which showed an alkaline phosphatase of 137 with rest of the liver enzymes only remarkable for AST of 35 and ALT of 28.  Patient states that for the last two years she had "two episodes of feeling her insides where raw, as if everything was scrapped in her abdomen". The patient had these episodes lasting 2-3 days, which was self limited. She did not take any medication for this, only drank some milk. There was no trigger for these episodes.  Patient has lost 15 lb since her diagnosis of breast cancer but has been stable recently.  The patient denies having any nausea, vomiting, fever, chills, hematochezia, melena, hematemesis, abdominal distention, diarrhea, jaundice, pruritus.  Last EGD: multiple years ago, normal per patient Last Colonoscopy: 6 years ago, normal per patient - performed at [facility]  FHx: neg for any gastrointestinal/liver disease, breast cancer mother and sister Social: neg smoking, alcohol or illicit drug use Surgical: cholecystectomy, c-section, hysterectomy,BSO  Past Medical History: Past Medical History:  Diagnosis Date  . Atrial flutter ([place])    a. Dx in 2018. Single episode. Not on [place] (CHA2DS2VASc = 2).  . Breast cancer ([place])   . Elevated coronary artery calcium score    a. 07/2019 Cardiac CT: Ca2+ 262 (90th %'ile).  . GERD (gastroesophageal reflux disease)   . History of echocardiogram    a. 06/2016 Echo: EF 60-65%, no rwma. Mild TR.  Marland Kitchen Personal history of radiation therapy 02/2018  . PONV (postoperative nausea and vomiting)   . PSVT (paroxysmal supraventricular tachycardia) ([place])    a. 06/2019 [facility]: RSR, avg HR 71, max  HR 222. 97 runs of SVT, fastest 222 (4 beats), longest 1:05 (164).    Past Surgical History: Past Surgical History:  Procedure Laterality Date  . A-FLUTTER ABLATION N/A 12/26/2019   Procedure: A-FLUTTER ABLATION;  Surgeon: Constance Haw, MD;  Location: [facility];  Service: Cardiovascular;  Laterality: N/A;  . ABDOMINAL HYSTERECTOMY    . BREAST BIOPSY Right 10/02/2017  . BREAST EXCISIONAL BIOPSY Right 01/2002  . BREAST LUMPECTOMY Right 2019  . BREAST LUMPECTOMY WITH RADIOACTIVE SEED AND SENTINEL LYMPH NODE BIOPSY Right 11/26/2017   Procedure: RIGHT BREAST LUMPECTOMY WITH SENTINEL NODE MAPPING AND TARGETED NODE DISECTION ERAS PATHWAY;  Surgeon: Jovita Kussmaul, MD;  Location: [facility];  Service: General;  Laterality: Right;  . CESAREAN SECTION    . CHOLECYSTECTOMY    . KNEE ARTHROSCOPY    . NECK SURGERY      Family History: Family History  Problem Relation Age of Onset  . Stroke Mother   . Breast cancer Mother 22  . Parkinson's disease Father   . Alzheimer's disease Father   . Breast cancer Sister 21    Social History: Social History   Tobacco Use  Smoking Status Never Smoker  Smokeless Tobacco Never Used   Social History   Substance and Sexual Activity  Alcohol Use Never   Social History   Substance and Sexual Activity  Drug Use Never    Allergies: Allergies  Allergen Reactions  . Penicillins Swelling    Reaction: Childhood  . Erythromycin Nausea And Vomiting  . Sulfa Antibiotics Nausea And Vomiting  . Sulfasalazine Nausea And Vomiting    Medications: Current Outpatient Medications  Medication Sig Dispense Refill  . apixaban (ELIQUIS) 5 MG TABS tablet Take 1 tablet (5 mg total) by mouth 2 (two) times daily. 60 tablet 6  . Cholecalciferol (DIALYVITE VITAMIN D 5000 PO) Take 5,000 Units by mouth daily.    . fexofenadine (ALLEGRA ALLERGY) 180 MG tablet Take 180 mg by mouth daily as needed for allergies.     . metoprolol succinate (TOPROL XL) 25 MG 24 hr tablet Take 0.5 tablets (12.5 mg total) by mouth at bedtime. Take as needed for fast heart rates. (Patient taking differently: Take 12.5 mg by mouth daily as needed (Take as needed for fast heart rates.). ) 15 tablet 6  . OVER THE COUNTER  MEDICATION Calcium 600 mg one daily    . rosuvastatin (CRESTOR) 5 MG tablet Take 1 tablet (5 mg total) by mouth daily. 90 tablet 3  . Zinc 30 MG TABS Take 30 mg by mouth daily.      No current facility-administered medications for this visit.    Review of Systems: GENERAL: negative for malaise, night sweats HEENT: No changes in hearing or vision, no nose bleeds or other nasal problems. NECK: Negative for lumps, goiter, pain and significant neck swelling RESPIRATORY: Negative for cough, wheezing CARDIOVASCULAR: Negative for chest pain, leg swelling, palpitations, orthopnea GI: SEE HPI MUSCULOSKELETAL: Negative for joint pain or swelling, back pain, and muscle pain. SKIN: Negative for lesions, rash PSYCH: Negative for sleep disturbance, mood disorder and recent psychosocial stressors. HEMATOLOGY Negative for prolonged bleeding, bruising easily, and swollen nodes. ENDOCRINE: Negative for cold or heat intolerance, polyuria, polydipsia and goiter. NEURO: negative for tremor, gait imbalance, syncope and seizures. The remainder of the review of systems is noncontributory.   Physical Exam: BP (!) 143/85 (BP Location: Left Arm, Patient Position: Sitting, Cuff Size: Small)   Pulse 62  Temp 98.3 F (36.8 C) (Oral)   Ht '5\' 5"'  (1.651 m)   Wt 116 lb (52.6 kg)   BMI 19.30 kg/m  GENERAL: The patient is AO x3, in no acute distress. Slim body habitus. HEENT: Head is normocephalic and atraumatic. EOMI are intact. Mouth is well hydrated and without lesions. NECK: Supple. No masses LUNGS: Clear to auscultation. No presence of rhonchi/wheezing/rales. Adequate chest expansion HEART: RRR, normal s1 and s2. ABDOMEN: Soft, nontender, no guarding, no peritoneal signs, and nondistended. BS +. No masses. EXTREMITIES: Without any cyanosis, clubbing, rash, lesions or edema. NEUROLOGIC: AOx3, no focal motor deficit. SKIN: no jaundice, no rashes   Imaging/Labs: as above  I personally reviewed and  interpreted the available labs, imaging and endoscopic files.  Impression and Plan: Theresa Hale is a 64 y.o. female with PMH breast cancer 2019 s/p lumpectomy and lymphadenectomy with XRT, PSVT, atrial fibrillation on Eliquis, GERD,  who presents for evaluation of elevated alkaline phosphatase and fatty liver.  Upon reviewing the patient's labs, her alkaline phosphatase was found to have mildly elevated transiently but has decreased recently.  She had multiple blood testing that was negative for PBC, autoimmune hepatitis, hemochromatosis, acute hepatitis a, B or C, or alpha-1 antitrypsin deficiency.  Remarkably, the mild elevation of her alkaline phosphatase in the setting of no ductal dilation based on the patient's report of her liver ultrasound, makes it unlikely that there is significant obstruction.  Even though seronegative PBC is a possibility, this may require a liver biopsy to confirm the diagnosis which both the patient and I agree is too aggressive and is unlikely to be consistent with her current presentation.  For now, given the presence of fatty liver, I advised the patient to implement the Mediterranean diet to improve the deposition of fat in her liver, which she understood and agreed to proceed with.  Finally, the patient has presented some episodes of abdominal pain of unclear etiology that have been self-limited.  Again, the patient was advised that we could proceed with a CT of the abdomen to evaluate this further but I consider it is low yield at this time as she has been asymptomatic for months, which she agreed to.  She was advised to call us immediately if she presented any more episodes and we will consider ordering this test at that moment.  - Patient was counseled about the benefit of implementing a Mediterranean diet and exercise.  - Patient requested to bring report of liver US - No need for further testing for liver enzyme abnormality - alkaline phosphatase is very mildly  elevated  All questions were answered.      Maylon Peppers, MD Gastroenterology and Hepatology [facility]

## 2020-01-17 ENCOUNTER — Other Ambulatory Visit: Payer: Self-pay | Admitting: Family

## 2020-01-17 ENCOUNTER — Telehealth (INDEPENDENT_AMBULATORY_CARE_PROVIDER_SITE_OTHER): Payer: Self-pay | Admitting: Gastroenterology

## 2020-01-17 NOTE — Telephone Encounter (Signed)
Received results of liver ultrasound performed on 04/11/2019 which showed echogenic liver with changes suggestive of steatosis but no nodularity.  Gallbladder was absent.  No presence of ductal dilation.  Therefore, her findings are consistent with known fatty liver.  Crystal, can you please call the patient and let her know that I reviewed the imaging and no further action is warranted for her fatty liver besides of what we discussed? There were no changes concerning for cirrhosis.  Thanks,  Maylon Peppers, MD Gastroenterology and Hepatology [facility]

## 2020-01-19 NOTE — Telephone Encounter (Signed)
Patient aware of all.

## 2020-01-19 NOTE — Telephone Encounter (Signed)
I called and left a message to return call. 

## 2020-01-25 NOTE — Telephone Encounter (Signed)
Heart rates are not low per the numbers. We prefer it high 50s, low 60s given her hx of arrhythmia There can be irritability for a few months after ablation, would expect some palpitations for a while 1/2 to whole metoprolol will be needed for arrhyhmia  BP up and down a bit, stress/anxiety can push it up Could try losartan 25 a day for high pressure (this is a low dose)

## 2020-01-27 ENCOUNTER — Ambulatory Visit: Payer: Medicare PPO | Admitting: Cardiology

## 2020-01-27 ENCOUNTER — Encounter: Payer: Self-pay | Admitting: Cardiology

## 2020-01-27 ENCOUNTER — Other Ambulatory Visit: Payer: Self-pay

## 2020-01-27 VITALS — BP 130/76 | HR 58 | Ht 65.0 in | Wt 115.0 lb

## 2020-01-27 DIAGNOSIS — I483 Typical atrial flutter: Secondary | ICD-10-CM

## 2020-01-27 NOTE — Patient Instructions (Signed)
Medication Instructions:  Your physician has recommended you make the following change in your medication:  1. STOP Eliquis  *If you need a refill on your cardiac medications before your next appointment, please call your pharmacy*   Lab Work: None ordered   Testing/Procedures: None ordered   Follow-Up: At [facility], you and your health needs are our priority.  As part of our continuing mission to provide you with exceptional heart care, we have created designated Provider Care Teams.  These Care Teams include your primary Cardiologist (physician) and Advanced Practice Providers (APPs -  Physician Assistants and Nurse Practitioners) who all work together to provide you with the care you need, when you need it.  Your next appointment:   3 month(s)  The format for your next appointment:   In Person  Provider:   Allegra Lai, MD   Thank you for choosing [place]!!   Trinidad Curet, RN [phone]

## 2020-01-27 NOTE — Progress Notes (Signed)
Electrophysiology Office Note   Date:  01/27/2020   ID:  Theresa Hale, DOB 07-16-1955, MRN [id number]  PCP:  Theresa Bis, MD  Cardiologist:  Rockey Hale Primary Electrophysiologist:  Theresa Bergen Meredith Leeds, MD    Chief Complaint: Atrial flutter   History of Present Illness: Theresa Hale is a 64 y.o. female who is being seen today for the evaluation of atrial flutter at the request of Theresa Bis, MD. Presenting today for electrophysiology evaluation.  She has a history significant for atrial flutter and SVT.  She has breast cancer, hypertension, and an elevated coronary calcium score.  She developed atrial flutter December 04, 2019.  She went to the emergency room and underwent cardioversion.  She has had 2 further episodes of atrial flutter as well as SVT.  SVT lasted for a minute at a time and terminates with Valsalva.  She is now status post atrial flutter ablation on 12/26/2019.  Today, denies symptoms of palpitations, chest pain, shortness of breath, orthopnea, PND, lower extremity edema, claudication, dizziness, presyncope, syncope, bleeding, or neurologic sequela. The patient is tolerating medications without difficulties.  She is unfortunately continued to have episodes of SVT.  She does not feel that she has been back in atrial flutter.  She started taking Toprol-XL 12.5 mg which improved her symptoms.   Past Medical History:  Diagnosis Date  . Atrial flutter ([place])    a. Dx in 2018. Single episode. Not on [place] (CHA2DS2VASc = 2).  . Breast cancer ([place])   . Elevated coronary artery calcium score    a. 07/2019 Cardiac CT: Ca2+ 262 (90th %'ile).  . GERD (gastroesophageal reflux disease)   . History of echocardiogram    a. 06/2016 Echo: EF 60-65%, no rwma. Mild TR.  Marland Kitchen Personal history of radiation therapy 02/2018  . PONV (postoperative nausea and vomiting)   . PSVT (paroxysmal supraventricular tachycardia) ([place])    a. 06/2019 Zio: RSR, avg HR 71, max HR 222. 97 runs of SVT,  fastest 222 (4 beats), longest 1:05 (164).   Past Surgical History:  Procedure Laterality Date  . A-FLUTTER ABLATION N/A 12/26/2019   Procedure: A-FLUTTER ABLATION;  Surgeon: Constance Haw, MD;  Location: [facility];  Service: Cardiovascular;  Laterality: N/A;  . ABDOMINAL HYSTERECTOMY    . BREAST BIOPSY Right 10/02/2017  . BREAST EXCISIONAL BIOPSY Right 01/2002  . BREAST LUMPECTOMY Right 2019  . BREAST LUMPECTOMY WITH RADIOACTIVE SEED AND SENTINEL LYMPH NODE BIOPSY Right 11/26/2017   Procedure: RIGHT BREAST LUMPECTOMY WITH SENTINEL NODE MAPPING AND TARGETED NODE DISECTION ERAS PATHWAY;  Surgeon: Theresa Kussmaul, MD;  Location: [facility];  Service: General;  Laterality: Right;  . CESAREAN SECTION    . CHOLECYSTECTOMY    . KNEE ARTHROSCOPY    . NECK SURGERY       Current Outpatient Medications  Medication Sig Dispense Refill  . Cholecalciferol (DIALYVITE VITAMIN D 5000 PO) Take 5,000 Units by mouth daily.    . fexofenadine (ALLEGRA) 180 MG tablet Take 180 mg by mouth daily as needed for allergies.     . metoprolol succinate (TOPROL-XL) 25 MG 24 hr tablet TAKE 1/2 (ONE-HALF) TABLET BY MOUTH AT BEDTIME 45 tablet 2  . OVER THE COUNTER MEDICATION Calcium 600 mg one daily    . rosuvastatin (CRESTOR) 5 MG tablet Take 1 tablet (5 mg total) by mouth daily. 90 tablet 3  . Zinc 30 MG TABS Take 30 mg by mouth daily.  No current facility-administered medications for this visit.    Allergies:   Penicillins, Erythromycin, Sulfa antibiotics, and Sulfasalazine   Social History:  The patient  reports that she has never smoked. She has never used smokeless tobacco. She reports that she does not drink alcohol and does not use drugs.   Family History:  The patient's family history includes Alzheimer's disease in her father; Breast cancer (age of onset: 25) in her sister; Breast cancer (age of onset: 46) in her mother; Parkinson's disease in her father; Stroke in her  mother.    ROS:  Please see the history of present illness.   Otherwise, review of systems is positive for none.   All other systems are reviewed and negative.   PHYSICAL EXAM: VS:  BP 130/76   Pulse (!) 58   Ht 5\' 5"  (1.651 m)   Wt 115 lb (52.2 kg)   SpO2 99%   BMI 19.14 kg/m  , BMI Body mass index is 19.14 kg/m. GEN: Well nourished, well developed, in no acute distress  HEENT: normal  Neck: no JVD, carotid bruits, or masses Cardiac: RRR; no murmurs, rubs, or gallops,no edema  Respiratory:  clear to auscultation bilaterally, normal work of breathing GI: soft, nontender, nondistended, + BS MS: no deformity or atrophy  Skin: warm and dry Neuro:  Strength and sensation are intact Psych: euthymic mood, full affect  EKG:  EKG is ordered today. Personal review of the ekg ordered shows sinus rhythm, rate 59  Recent Labs: 12/04/2019: ALT 28; B Natriuretic Peptide 385.8; BUN 19; Creatinine, Ser 0.76; Hemoglobin 14.6; Magnesium 2.1; Platelets 229; Potassium 3.8; Sodium 137; TSH 3.740    Lipid Panel  No results found for: CHOL, TRIG, HDL, CHOLHDL, VLDL, LDLCALC, LDLDIRECT   Wt Readings from Last 3 Encounters:  01/27/20 115 lb (52.2 kg)  01/15/20 116 lb (52.6 kg)  12/26/19 112 lb (50.8 kg)      Other studies Reviewed: Additional studies/ records that were reviewed today include: TTE 06/21/16  Review of the above records today demonstrates:  - Left ventricle: The cavity size was normal. Wall thickness was  normal. Systolic function was normal. The estimated ejection  fraction was in the range of 60% to 65%. Wall motion was normal;  there were no regional wall motion abnormalities. There was an  increased relative contribution of atrial contraction to  ventricular filling. Left ventricular diastolic function  parameters were normal.  - Aortic valve: Mildly calcified annulus. Probably trileaflet.  - Mitral valve: Mildly calcified annulus.  - Tricuspid valve: There  was mild regurgitation.   Cardiac monitor 08/13/2019 personally reviewed Normal sinus rhythm avg HR of 71 bpm. max HR of 222 bpm  97 Supraventricular Tachycardia runs occurred, the run with the fastest interval lasting 4 beats with a max rate of 222 bpm, the longest lasting 1 min 5 secs with an avg rate of 164 bpm.  Supraventricular Tachycardia was detected within +/- 45 seconds of symptomatic patient event(s).   Junctional Rhythm was present.   Isolated SVEs were rare (<1.0%), SVE Couplets were rare (<1.0%), and SVE Triplets were rare (<1.0%). Isolated VEs were rare (<1.0%), VE Couplets were rare (<1.0%), and no VE Triplets were present.  ASSESSMENT AND PLAN:  1.  Typical atrial flutter: Currently on Eliquis.  Has not no further episodes.  We Alera Quevedo stop her Eliquis.  2.  SVT: Cardiac monitor with heart rates between 160 and 220.  EP study during atrial flutter ablation showed no inducible arrhythmia.  She is unfortunately continued to have episodes.  This is improved on metoprolol.  We Nairi Oswald continue and see her back in 3 months.  Current medicines are reviewed at length with the patient today.   The patient does not have concerns regarding her medicines.  The following changes were made today: Stop Eliquis  Labs/ tests ordered today include:  Orders Placed This Encounter  Procedures  . EKG 12-Lead     Disposition:   FU with Gearldine Looney 3 months  Signed, Taiwana Willison Meredith Leeds, MD  01/27/2020 4:56 PM     [address] [place] [address] [phone] (office) [phone] (fax)

## 2020-03-02 NOTE — Assessment & Plan Note (Signed)
10/14/2019right lumpectomy: Grade 2 IDC 1.9 cm, margins negative, 1/2 lymph nodes positive, ER 60%, PR 40%, HER-2 2+ by IHC negative by FISH ratio 1.35, copy #2.1, Ki-67 2%, T1cN1a stage IA Patient had Mammaprint prior to surgery and it was low risk luminal typeA Adjuvant radiation therapy at [facility] 02/25/2018  Antiestrogen therapy: Patient took neoadjuvant letrozole and could not tolerate it. She does not want to take any antiestrogen therapy   Breast cancer surveillance: 1.Mammogram 08/13/2019: Benign breast density category D 2.Annual breast exams:03/06/19: Benign 3.Breast MRI 02/03/2019: No evidence of malignancy.   MRIs will be ordered on the breast every other year.  Return to clinic in 1 year for follow-up

## 2020-03-04 ENCOUNTER — Telehealth: Payer: Self-pay | Admitting: Hematology and Oncology

## 2020-03-04 NOTE — Progress Notes (Signed)
HEMATOLOGY-ONCOLOGY MYCHART VIDEO VISIT PROGRESS NOTE  I connected with Phillips Theresa Hale on 03/05/2020 at 10:00 AM EST by MyChart video conference and verified that I am speaking with the correct person using two identifiers.  I discussed the limitations, risks, security and privacy concerns of performing an evaluation and management service by MyChart and the availability of in person appointments.  I also discussed with the patient that there may be a patient responsible charge related to this service. The patient expressed understanding and agreed to proceed.  Patient's Location: Home Physician Location: Clinic  CHIEF COMPLIANT: Follow-up of right breast cancer  INTERVAL HISTORY: Theresa Hale is a 65 y.o. female with above-mentioned history of right breast cancerwhounderwentalumpectomy, radiation, and was unable to tolerate letrozole.Mammogram on 08/13/19 showed no evidence of malignancy bilaterally. She presents over MyChart today for follow-up.  She had an ablation in the heart but otherwise her health has been excellent.  Denies any lumps or nodules in the breast.  Oncology History  Malignant neoplasm of upper-outer quadrant of right breast in female, estrogen receptor positive ([place])  10/09/2017 Initial Diagnosis   Palpable lump in the right breast with a history of right breast atypia in mother and sister with breast cancers, 1.9 cm mass at 10 o'clock position right breast biopsy 10 o'clock position 5 cm from nipple: Grade 1 invasive ductal carcinoma ER 60%, PR 40%, Ki-67 2%, HER-2 negative ratio 1.35 T1cN1a stage Ib AJCC 8   11/26/2017 Surgery   Right lumpectomy: Grade 2 IDC 1.9 cm, margins negative, 1/2 lymph nodes positive, ER 60%, PR 40%, HER-2 2+ by IHC negative by FISH ratio 1.35, copy #2.1, Ki-67 2%, T1cN1a stage IA    12/03/2017 Cancer Staging   Staging form: Breast, AJCC 8th Edition - Pathologic: Stage IA (pT1c, pN1a, cM0, G2, ER+, PR+, HER2-) - Signed by Nicholas Lose, MD  on 12/03/2017   01/17/2018 - 02/25/2018 Radiation Therapy   Adj XRT at [facility]     Observations/Objective:  There were no vitals filed for this visit. There is no height or weight on file to calculate BMI.  I have reviewed the data as listed CMP Latest Ref Rng & Units 12/04/2019  Glucose 70 - 99 mg/dL 111(H)  BUN 8 - 23 mg/dL 19  Creatinine 0.44 - 1.00 mg/dL 0.76  Sodium 135 - 145 mmol/L 137  Potassium 3.5 - 5.1 mmol/L 3.8  Chloride 98 - 111 mmol/L 100  CO2 22 - 32 mmol/L 23  Calcium 8.9 - 10.3 mg/dL 9.7  Total Protein 6.5 - 8.1 g/dL 7.8  Total Bilirubin 0.3 - 1.2 mg/dL 1.0  Alkaline Phos 38 - 126 U/L 137(H)  AST 15 - 41 U/L 35  ALT 0 - 44 U/L 28    Lab Results  Component Value Date   WBC 8.1 12/04/2019   HGB 14.6 12/04/2019   HCT 42.2 12/04/2019   MCV 84.4 12/04/2019   PLT 229 12/04/2019   NEUTROABS 6.4 12/04/2019      Assessment Plan:  Malignant neoplasm of upper-outer quadrant of right breast in female, estrogen receptor positive (Theresa Hale) 10/14/2019right lumpectomy: Grade 2 IDC 1.9 cm, margins negative, 1/2 lymph nodes positive, ER 60%, PR 40%, HER-2 2+ by IHC negative by FISH ratio 1.35, copy #2.1, Ki-67 2%, T1cN1a stage IA Patient had Mammaprint prior to surgery and it was low risk luminal typeA Adjuvant radiation therapy at [facility] 02/25/2018  Antiestrogen therapy: Patient took neoadjuvant letrozole and could not tolerate it. She does not want to take  any antiestrogen therapy  Ablation Nov 2021: for A.Fib  Breast cancer surveillance: 1.Mammogram 08/13/2019: Benign breast density category D 2.Annual breast exams:03/06/19: Benign 3.Breast MRI 02/03/2019: No evidence of malignancy.   MRIs will be ordered on the breast every other year.  She would like her mammograms and breast MRIs to be done at [facility]. I sent the orders to [facility] for MRI to be done in December 2022 and a mammogram to be done in July 2022. Return to clinic in 1 year for follow-up  I  discussed the assessment and treatment plan with the patient. The patient was provided an opportunity to ask questions and all were answered. The patient agreed with the plan and demonstrated an understanding of the instructions. The patient was advised to call back or seek an in-person evaluation if the symptoms worsen or if the condition fails to improve as anticipated.   I provided 20 minutes of face-to-face MyChart video visit time during this encounter.    Rulon Eisenmenger, MD 03/05/2020   I, Molly Dorshimer, am acting as scribe for Nicholas Lose, MD.  I have reviewed the above documentation for accuracy and completeness, and I agree with the above.

## 2020-03-04 NOTE — Telephone Encounter (Signed)
Left message to verify mychart visit for pre reg °

## 2020-03-05 ENCOUNTER — Inpatient Hospital Stay: Payer: Medicare PPO | Attending: Hematology and Oncology | Admitting: Hematology and Oncology

## 2020-03-05 DIAGNOSIS — Z170 Estrogen receptor positive status [ER+]: Secondary | ICD-10-CM

## 2020-03-05 DIAGNOSIS — C50411 Malignant neoplasm of upper-outer quadrant of right female breast: Secondary | ICD-10-CM | POA: Diagnosis not present

## 2020-03-19 ENCOUNTER — Encounter (INDEPENDENT_AMBULATORY_CARE_PROVIDER_SITE_OTHER): Payer: Self-pay

## 2020-03-24 ENCOUNTER — Telehealth: Payer: Self-pay

## 2020-03-24 NOTE — Telephone Encounter (Signed)
Pt was called and [facility] moved her appt

## 2020-03-30 ENCOUNTER — Emergency Department: Payer: Medicare PPO

## 2020-03-30 ENCOUNTER — Other Ambulatory Visit: Payer: Self-pay

## 2020-03-30 ENCOUNTER — Emergency Department
Admission: EM | Admit: 2020-03-30 | Discharge: 2020-03-30 | Disposition: A | Discharge status: Home / Self Care | Payer: Medicare PPO | Attending: Emergency Medicine | Admitting: Emergency Medicine

## 2020-03-30 DIAGNOSIS — Z853 Personal history of malignant neoplasm of breast: Secondary | ICD-10-CM | POA: Diagnosis not present

## 2020-03-30 DIAGNOSIS — Z981 Arthrodesis status: Secondary | ICD-10-CM | POA: Diagnosis not present

## 2020-03-30 DIAGNOSIS — R221 Localized swelling, mass and lump, neck: Secondary | ICD-10-CM | POA: Diagnosis not present

## 2020-03-30 DIAGNOSIS — G8929 Other chronic pain: Secondary | ICD-10-CM | POA: Diagnosis not present

## 2020-03-30 DIAGNOSIS — M542 Cervicalgia: Secondary | ICD-10-CM | POA: Insufficient documentation

## 2020-03-30 DIAGNOSIS — R079 Chest pain, unspecified: Secondary | ICD-10-CM | POA: Diagnosis not present

## 2020-03-30 LAB — BASIC METABOLIC PANEL
Anion gap: 10 (ref 5–15)
BUN: 17 mg/dL (ref 8–23)
CO2: 26 mmol/L (ref 22–32)
Calcium: 9.4 mg/dL (ref 8.9–10.3)
Chloride: 102 mmol/L (ref 98–111)
Creatinine, Ser: 0.83 mg/dL (ref 0.44–1.00)
GFR, Estimated: >60 mL/min (ref >60)
Glucose, Bld: 99 mg/dL (ref 70–99)
Potassium: 3.8 mmol/L (ref 3.5–5.1)
Sodium: 138 mmol/L (ref 135–145)

## 2020-03-30 LAB — CBC
HCT: 40.3 % (ref 36.0–46.0)
Hemoglobin: 14 g/dL (ref 12.0–15.0)
MCH: 29.5 pg (ref 26.0–34.0)
MCHC: 34.7 g/dL (ref 30.0–36.0)
MCV: 85 fL (ref 80.0–100.0)
Platelets: 149 10*3/uL — ABNORMAL LOW (ref 150–400)
RBC: 4.74 MIL/uL (ref 3.87–5.11)
RDW: 12.8 % (ref 11.5–15.5)
WBC: 5.4 10*3/uL (ref 4.0–10.5)
nRBC: 0 % (ref 0.0–0.2)

## 2020-03-30 LAB — TROPONIN I (HIGH SENSITIVITY): Troponin I (High Sensitivity): 3 ng/L (ref <18)

## 2020-03-30 MED ORDER — FAMOTIDINE 20 MG PO TABS
20.0000 mg | ORAL_TABLET | Freq: Once | ORAL | Status: AC
  Administered 2020-03-30: 20 mg via ORAL
  Filled 2020-03-30: qty 1

## 2020-03-30 MED ORDER — FAMOTIDINE 20 MG PO TABS: 20.0000 mg | ORAL_TABLET | Freq: Two times a day (BID) | ORAL | 0 refills | Status: DC

## 2020-03-30 MED ORDER — PREDNISONE 20 MG PO TABS: ORAL_TABLET | ORAL | 0 refills | Status: DC

## 2020-03-30 MED ORDER — PREDNISONE 20 MG PO TABS
60.0000 mg | ORAL_TABLET | Freq: Once | ORAL | Status: AC
  Administered 2020-03-30: 60 mg via ORAL
  Filled 2020-03-30: qty 3

## 2020-03-30 MED ORDER — DIPHENHYDRAMINE HCL 25 MG PO CAPS
25.0000 mg | ORAL_CAPSULE | Freq: Once | ORAL | Status: AC
  Administered 2020-03-30: 25 mg via ORAL
  Filled 2020-03-30: qty 1

## 2020-03-30 NOTE — Discharge Instructions (Addendum)
Continue medicines for the next 4 days: Prednisone 40 mg daily  Pepcid 20 mg twice daily 2. Return to the ER for worsening symptoms, persistent vomiting, difficulty breathing or other concerns.

## 2020-03-30 NOTE — ED Triage Notes (Signed)
Pt states around 11pm she noticed "tightening in neck" that is now causing shortness of breath. Pt states she has tolerated PO since she noticed "tightening," denies tongue/lip swelling, able to talk in complete sentences, O2 saturation 100% on room air, respirations even and unlabored, no wheezing noted, no acute distress noted. Denies exposure to allergens. Denies new medications, states only recent change in medications is metoprolol frequency.

## 2020-03-30 NOTE — ED Notes (Signed)
This RN spoke with Beather Arbour MD about patient chief complaint, Beather Arbour MD advising initiate chest pain/cardiac protocol while in lobby.

## 2020-03-30 NOTE — ED Notes (Signed)
[place] bed. E signature pad not working. Pt educated on discharge instructions and verbalized understanding.

## 2020-03-30 NOTE — ED Provider Notes (Signed)
[facility] Emergency Department Provider Note   ____________________________________________   Event Date/Time   First MD Initiated Contact with Patient 03/30/20 0330     (approximate)  I have reviewed the triage vital signs and the nursing notes.   HISTORY  Chief Complaint Sore Throat    HPI Theresa Hale is a 65 y.o. female who presents to the ED from home with a chief complaint of sensation of tightening/swelling of her neck.  Around 11 PM, patient was reading a book when she noticed a sensation in the front of her throat of tightening and swelling.  Husband states there was no physical evidence of swelling.  The sensation made it hard to breathe.  Denies traditional sensation of sore throat but states it feels painful when she lays back.  Patient denies tongue/lip swelling.  No difficulty speaking or swallowing liquids.  No rash.  Denies new exposures including food, medications including OTCs, toothpaste, mouthwash, etc.  History of atrial flutter taken off Eliquis in December 2021, SVT on metoprolol 1/2 tablet every other day.  Denies recent illnesses, fever, cough, chest pain, abdominal pain, nausea, vomiting or diarrhea.     Past Medical History:  Diagnosis Date  . Atrial flutter ([place])    a. Dx in 2018. Single episode. Not on [place] (CHA2DS2VASc = 2).  . Breast cancer ([place])   . Elevated coronary artery calcium score    a. 07/2019 Cardiac CT: Ca2+ 262 (90th %'ile).  . GERD (gastroesophageal reflux disease)   . History of echocardiogram    a. 06/2016 Echo: EF 60-65%, no rwma. Mild TR.  Marland Kitchen Personal history of radiation therapy 02/2018  . PONV (postoperative nausea and vomiting)   . PSVT (paroxysmal supraventricular tachycardia) ([place])    a. 06/2019 Zio: RSR, avg HR 71, max HR 222. 97 runs of SVT, fastest 222 (4 beats), longest 1:05 (164).    Patient Active Problem List   Diagnosis Date Noted  . Elevated alkaline phosphatase level 01/15/2020  . Fatty  liver 01/15/2020  . Malignant neoplasm of upper-outer quadrant of right breast in female, estrogen receptor positive ([place]) 10/18/2017    Past Surgical History:  Procedure Laterality Date  . A-FLUTTER ABLATION N/A 12/26/2019   Procedure: A-FLUTTER ABLATION;  Surgeon: Constance Haw, MD;  Location: [place] CV [facility];  Service: Cardiovascular;  Laterality: N/A;  . ABDOMINAL HYSTERECTOMY    . BREAST BIOPSY Right 10/02/2017  . BREAST EXCISIONAL BIOPSY Right 01/2002  . BREAST LUMPECTOMY Right 2019  . BREAST LUMPECTOMY WITH RADIOACTIVE SEED AND SENTINEL LYMPH NODE BIOPSY Right 11/26/2017   Procedure: RIGHT BREAST LUMPECTOMY WITH SENTINEL NODE MAPPING AND TARGETED NODE DISECTION ERAS PATHWAY;  Surgeon: Jovita Kussmaul, MD;  Location: [place];  Service: General;  Laterality: Right;  . CESAREAN SECTION    . CHOLECYSTECTOMY    . KNEE ARTHROSCOPY    . NECK SURGERY      Prior to Admission medications   Medication Sig Start Date End Date Taking? Authorizing Provider  famotidine (PEPCID) 20 MG tablet Take 1 tablet (20 mg total) by mouth 2 (two) times daily. 03/30/20  Yes Paulette Blanch, MD  predniSONE (DELTASONE) 20 MG tablet 2 tablets daily x 4 days 03/30/20  Yes Paulette Blanch, MD  Cholecalciferol (DIALYVITE VITAMIN D 5000 PO) Take 5,000 Units by mouth daily.    [provider]  metoprolol succinate (TOPROL-XL) 25 MG 24 hr tablet TAKE 1/2 (ONE-HALF) TABLET BY MOUTH AT BEDTIME 01/19/20  Loel Dubonnet, NP  OVER THE COUNTER MEDICATION Calcium 600 mg one daily    [provider]  rosuvastatin (CRESTOR) 5 MG tablet Take 1 tablet (5 mg total) by mouth daily. 11/05/19 02/03/20  Minna Merritts, MD  Zinc 30 MG TABS Take 30 mg by mouth daily.  10/16/19   [provider]    Allergies Penicillins, Erythromycin, Sulfa antibiotics, and Sulfasalazine  Family History  Problem Relation Age of Onset  . Stroke Mother   . Breast cancer Mother 70  . Parkinson's  disease Father   . Alzheimer's disease Father   . Breast cancer Sister 35    Social History Social History   Tobacco Use  . Smoking status: Never Smoker  . Smokeless tobacco: Never Used  Vaping Use  . Vaping Use: Never used  Substance Use Topics  . Alcohol use: Never  . Drug use: Never    Review of Systems  Constitutional: No fever/chills Eyes: No visual changes. ENT: Positive for sensation of throat tightening/swelling.  No sore throat. Cardiovascular: Denies chest pain. Respiratory: Denies shortness of breath. Gastrointestinal: No abdominal pain.  No nausea, no vomiting.  No diarrhea.  No constipation. Genitourinary: Negative for dysuria. Musculoskeletal: Negative for back pain. Skin: Negative for rash. Neurological: Negative for headaches, focal weakness or numbness.   ____________________________________________   PHYSICAL EXAM:  VITAL SIGNS: ED Triage Vitals  Enc Vitals Group     BP 03/30/20 0028 (!) 158/79     Pulse Rate 03/30/20 0028 63     Resp 03/30/20 0028 16     Temp 03/30/20 0028 97.8 F (36.6 C)     Temp Source 03/30/20 0028 Oral     SpO2 03/30/20 0028 100 %     Weight 03/30/20 0029 113 lb (51.3 kg)     Height 03/30/20 0029 5\' 5"  (1.651 m)     Head Circumference --      Peak Flow --      Pain Score 03/30/20 0029 0     Pain Loc --      Pain Edu? --      Excl. in [place]? --     Constitutional: Alert and oriented. Well appearing and in no acute distress. Eyes: Conjunctivae are normal. PERRL. EOMI. Head: Atraumatic. Nose: No congestion/rhinnorhea. Mouth/Throat: Mucous membranes are moist.  Oropharynx non erythematous without tonsillar exudate, swelling or peritonsillar abscess.  Phonation normal.  There is no hoarse or muffled voice.  There is no drooling.  Patient tolerating secretions well. Neck: No stridor.  No cervical spine tenderness to palpation.  No palpable mass.  Anterior neck without swelling, warmth or erythema and nontender to palpation.   No lymphadenopathy.  Posterior neck unremarkable.  Supple neck without meningismus. Cardiovascular: Normal rate, regular rhythm. Grossly normal heart sounds.  Good peripheral circulation. Respiratory: Normal respiratory effort.  No retractions. Lungs CTAB. Gastrointestinal: Soft and nontender. No distention. No abdominal bruits. No CVA tenderness. Musculoskeletal: No lower extremity tenderness nor edema.  No joint effusions. Neurologic:  Normal speech and language. No gross focal neurologic deficits are appreciated. No gait instability. Skin:  Skin is warm, dry and intact. No rash noted. Psychiatric: Mood and affect are normal. Speech and behavior are normal.  ____________________________________________   LABS (all labs ordered are listed, but only abnormal results are displayed)  Labs Reviewed  CBC - Abnormal; Notable for the following components:      Result Value   Platelets 149 (*)    All other components within  normal limits  BASIC METABOLIC PANEL  TROPONIN I (HIGH SENSITIVITY)   ____________________________________________  EKG  ED ECG REPORT I, Kabrina Christiano J, the attending physician, personally viewed and interpreted this ECG.   Date: 03/30/2020  EKG Time: 0111  Rate: 64  Rhythm: normal EKG, normal sinus rhythm  Axis: Normal  Intervals:none  ST&T Change: Nonspecific  ____________________________________________  RADIOLOGY I, Demaree Liberto J, personally viewed and evaluated these images (plain radiographs) as part of my medical decision making, as well as reviewing the written report by the radiologist.  ED MD interpretation: No acute cardiopulmonary process; unremarkable CT soft tissue neck  Official radiology report(s): DG Chest 2 View  Result Date: 03/30/2020 CLINICAL DATA:  Upper chest and neck pain. EXAM: CHEST - 2 VIEW COMPARISON:  Chest x-ray 12/04/2019, CT cardiac 07/23/2019, CT chest 08/18/2005 FINDINGS: The heart size and mediastinal contours are within  normal limits. Biapical pleural/pulmonary scarring. No focal consolidation. No pulmonary edema. No pleural effusion. No pneumothorax. Persistent diffusely decreased bone density. No acute osseous abnormality. Cervical spine surgical hardware. Right axillary surgical clips. Age-indeterminate superior endplate mild compression fracture of the upper thoracic spine. IMPRESSION: 1. No active cardiopulmonary disease. 2. Diffusely decreased bone density with an age-indeterminate superior endplate mild compression fracture of the upper thoracic spine. Electronically Signed   By: Iven Finn M.D.   On: 03/30/2020 01:42   CT Soft Tissue Neck Wo Contrast  Result Date: 03/30/2020 CLINICAL DATA:  Chronic neck pain with sensation of swelling. Neck tightening EXAM: CT NECK WITHOUT CONTRAST TECHNIQUE: Multidetector CT imaging of the neck was performed following the standard protocol without intravenous contrast. COMPARISON:  None. FINDINGS: Pharynx and larynx: Normal. No asymmetry or swelling. Salivary glands: No inflammation, mass, or stone. Thyroid: Small ,mildly heterogeneous gland without visible mass or fat inflammation. Lymph nodes: None enlarged or abnormal density. Vascular: Incidental persistent left SVC, partially covered. Limited intracranial: Negative Visualized orbits: Negative Mastoids and visualized paranasal sinuses: Clear Skeleton: C4-C7 solid arthrodesis. Advanced disc degeneration at C3-4. Upper chest: Biapical pleural based scarring and calcification. IMPRESSION: No acute finding.  No visible swelling in the neck. Electronically Signed   By: Monte Fantasia M.D.   On: 03/30/2020 06:48    ____________________________________________   PROCEDURES  Procedure(s) performed (including Critical Care):  Procedures   ____________________________________________   INITIAL IMPRESSION / ASSESSMENT AND PLAN / ED COURSE  As part of my medical decision making, I reviewed the following data within the  [place] History obtained from family, Nursing notes reviewed and incorporated, Labs reviewed, EKG interpreted, Old chart reviewed, Radiograph reviewed and Notes from prior ED visits     65 year old female presenting with sensation of throat tightening/swelling.  Differential diagnosis includes but is not limited to allergic reaction, infection, ACS, mass, etc.  Work-up unremarkable.  Patient is describing allergic reaction although no clear etiology noted.  Discussed with patient and spouse; will treat with Benadryl, Pepcid and Prednisone.  Hold EpiPen for now as there is no appreciable swelling.  Obtain noncontrast CT soft tissue neck to evaluate for mass.  Will reassess.  Clinical Course as of 03/30/20 1610  Tue Mar 30, 2020  0652 Updated patient and spouse on unremarkable CT scan of the neck.  Will discharge home Prednisone, Pepcid and she will follow up with ENT this week.  Strict return precautions given.  Both verbalized understanding agree with plan of care. [JS]    Clinical Course User Index [JS] Paulette Blanch, MD     ____________________________________________  FINAL CLINICAL IMPRESSION(S) / ED DIAGNOSES  Final diagnoses:  Neck discomfort     ED Discharge Orders         Ordered    predniSONE (DELTASONE) 20 MG tablet        03/30/20 0641    famotidine (PEPCID) 20 MG tablet  2 times daily        03/30/20 8329          *Please note:  Theresa Hale was evaluated in Emergency Department on 03/30/2020 for the symptoms described in the history of present illness. She was evaluated in the context of the global COVID-19 pandemic, which necessitated consideration that the patient might be at risk for infection with the SARS-CoV-2 virus that causes COVID-19. Institutional protocols and algorithms that pertain to the evaluation of patients at risk for COVID-19 are in a state of rapid change based on information released by regulatory bodies including the CDC and  federal and state organizations. These policies and algorithms were followed during the patient's care in the ED.  Some ED evaluations and interventions may be delayed as a result of limited staffing during and the pandemic.*   Note:  This document was prepared using Dragon voice recognition software and may include unintentional dictation errors.   Paulette Blanch, MD 03/30/20 [phone]

## 2020-04-01 DIAGNOSIS — R1314 Dysphagia, pharyngoesophageal phase: Secondary | ICD-10-CM | POA: Diagnosis not present

## 2020-04-01 DIAGNOSIS — J300 Vasomotor rhinitis: Secondary | ICD-10-CM | POA: Diagnosis not present

## 2020-04-01 DIAGNOSIS — K219 Gastro-esophageal reflux disease without esophagitis: Secondary | ICD-10-CM | POA: Diagnosis not present

## 2020-04-03 DIAGNOSIS — K2100 Gastro-esophageal reflux disease with esophagitis, without bleeding: Secondary | ICD-10-CM | POA: Diagnosis not present

## 2020-04-03 DIAGNOSIS — Z681 Body mass index (BMI) 19 or less, adult: Secondary | ICD-10-CM | POA: Diagnosis not present

## 2020-04-29 ENCOUNTER — Encounter: Payer: Self-pay | Admitting: Obstetrics and Gynecology

## 2020-04-30 ENCOUNTER — Encounter: Payer: Medicare PPO | Admitting: Obstetrics and Gynecology

## 2020-04-30 ENCOUNTER — Other Ambulatory Visit: Payer: Self-pay

## 2020-05-04 ENCOUNTER — Ambulatory Visit: Payer: Medicare PPO | Admitting: Obstetrics and Gynecology

## 2020-05-04 ENCOUNTER — Other Ambulatory Visit: Payer: Self-pay

## 2020-05-04 ENCOUNTER — Encounter: Payer: Self-pay | Admitting: Obstetrics and Gynecology

## 2020-05-04 VITALS — BP 144/87 | HR 87 | Ht 65.0 in | Wt 117.3 lb

## 2020-05-04 DIAGNOSIS — Z01419 Encounter for gynecological examination (general) (routine) without abnormal findings: Secondary | ICD-10-CM | POA: Diagnosis not present

## 2020-05-04 DIAGNOSIS — N898 Other specified noninflammatory disorders of vagina: Secondary | ICD-10-CM | POA: Diagnosis not present

## 2020-05-04 DIAGNOSIS — N952 Postmenopausal atrophic vaginitis: Secondary | ICD-10-CM | POA: Diagnosis not present

## 2020-05-04 DIAGNOSIS — Z7689 Persons encountering health services in other specified circumstances: Secondary | ICD-10-CM

## 2020-05-04 DIAGNOSIS — K5900 Constipation, unspecified: Secondary | ICD-10-CM | POA: Diagnosis not present

## 2020-05-04 DIAGNOSIS — N895 Stricture and atresia of vagina: Secondary | ICD-10-CM | POA: Diagnosis not present

## 2020-05-04 DIAGNOSIS — Z853 Personal history of malignant neoplasm of breast: Secondary | ICD-10-CM

## 2020-05-04 DIAGNOSIS — M810 Age-related osteoporosis without current pathological fracture: Secondary | ICD-10-CM | POA: Diagnosis not present

## 2020-05-04 MED ORDER — INTRAROSA 6.5 MG VA INST: 1.0000 | VAGINAL_INSERT | Freq: Every day | VAGINAL | 11 refills | Status: DC

## 2020-05-04 NOTE — Progress Notes (Unsigned)
ANNUAL PREVENTATIVE CARE GYNECOLOGY  ENCOUNTER NOTE  Subjective:       Theresa Hale is a 65 y.o. [phone] female here to establish care, and for a routine annual gynecologic exam. She is transferring care from [facility] in [place], [place]. She has a history significant for breast cancer (right) with lumpectomy and lymphadenectomy.The patient is not currently sexually active due to discomfort. The patient is not currently taking hormone replacement therapy. Patient denies post-menopausal vaginal bleeding.  Has the patient ever been transfused or tattooed?: no.    Current complaints: 1.  Patient complains of yellow/brown vaginal discharge in the vagina, mostly noted when having a bowel movement. Discharge does not have an odor, and does not cause itching or burning.  2. Mild constipation - notes she eats an apple and cheese daily. This has been her norm for many years, but only recently has noted occasional difficulty with bowel movements.  2. Reports dyspareunia with intercourse so is currently not sexually active, but still desires to be.  Feels that the vagina is very tight and dry.  Has a history of labial fusion. Had utilized HRT (local, including Estring and estrogen cream) for many years after menopause, however discontinued when she was diagnosed with breast cancer. Had also tried use of more natural remedies such as vaginal moisturizers and coconut oil, however note that these did not help much.    Gynecologic History No LMP recorded. Patient has had a hysterectomy. Contraception: status post hysterectomy Last Pap: 1989.  No longer needed. No prior h/o abnormal pap smears.  Last mammogram: 08/13/2019. Results were: normal Last Colonoscopy: 6 years ago, per patient. Was normal. Performed at [facility].  Last Dexa Scan: 10/21/2019.  Results were: Osteoporosis, T score -3.3.    Obstetric History OB History  Gravida Para Term Preterm AB Living  '3 3 3     3  ' SAB IAB Ectopic Multiple  Live Births          3    # Outcome Date GA Lbr Len/2nd Weight Sex Delivery Anes PTL Lv  3 Term 09/11/87    F CS-Unspec   LIV  2 Term 03/07/85    M CS-Unspec   LIV  1 Term 08/18/83    F CS-Unspec   LIV    Past Medical History:  Diagnosis Date  . Atrial flutter ([place])    a. Dx in 2018. Single episode. Not on [place] (CHA2DS2VASc = 2).  . Breast cancer ([place])   . Elevated coronary artery calcium score    a. 07/2019 Cardiac CT: Ca2+ 262 (90th %'ile).  . GERD (gastroesophageal reflux disease)   . History of echocardiogram    a. 06/2016 Echo: EF 60-65%, no rwma. Mild TR.  Marland Kitchen Personal history of radiation therapy 02/2018  . PONV (postoperative nausea and vomiting)   . PSVT (paroxysmal supraventricular tachycardia) ([place])    a. 06/2019 Zio: RSR, avg HR 71, max HR 222. 97 runs of SVT, fastest 222 (4 beats), longest 1:05 (164).    Family History  Problem Relation Age of Onset  . Stroke Mother   . Breast cancer Mother 67  . Parkinson's disease Father   . Alzheimer's disease Father   . Breast cancer Sister 92    Past Surgical History:  Procedure Laterality Date  . A-FLUTTER ABLATION N/A 12/26/2019   Procedure: A-FLUTTER ABLATION;  Surgeon: Constance Haw, MD;  Location: [place] CV LAB;  Service: Cardiovascular;  Laterality: N/A;  . ABDOMINAL HYSTERECTOMY    .  ABLATION    . BREAST BIOPSY Right 10/02/2017  . BREAST EXCISIONAL BIOPSY Right 01/2002  . BREAST LUMPECTOMY Right 2019  . BREAST LUMPECTOMY WITH RADIOACTIVE SEED AND SENTINEL LYMPH NODE BIOPSY Right 11/26/2017   Procedure: RIGHT BREAST LUMPECTOMY WITH SENTINEL NODE MAPPING AND TARGETED NODE DISECTION ERAS PATHWAY;  Surgeon: Jovita Kussmaul, MD;  Location: [place];  Service: General;  Laterality: Right;  . CESAREAN SECTION    . CHOLECYSTECTOMY    . KNEE ARTHROSCOPY    . NECK SURGERY      Social History   Socioeconomic History  . Marital status: Married    Spouse name: Not on file  . Number of children:  Not on file  . Years of education: Not on file  . Highest education level: Not on file  Occupational History  . Not on file  Tobacco Use  . Smoking status: Never Smoker  . Smokeless tobacco: Never Used  Vaping Use  . Vaping Use: Never used  Substance and Sexual Activity  . Alcohol use: Never  . Drug use: Never  . Sexual activity: Yes  Other Topics Concern  . Not on file  Social History Narrative   Lives locally w/ husband. Active.   Social Determinants of Health   Financial Resource Strain: Not on file  Food Insecurity: Not on file  Transportation Needs: Not on file  Physical Activity: Not on file  Stress: Not on file  Social Connections: Not on file  Intimate Partner Violence: Not on file    Current Outpatient Medications on File Prior to Visit  Medication Sig Dispense Refill  . Cholecalciferol (DIALYVITE VITAMIN D 5000 PO) Take 5,000 Units by mouth daily.    Marland Kitchen ELDERBERRY PO Take by mouth.    . metoprolol succinate (TOPROL-XL) 25 MG 24 hr tablet TAKE 1/2 (ONE-HALF) TABLET BY MOUTH AT BEDTIME 45 tablet 2  . OVER THE COUNTER MEDICATION Calcium 600 mg one daily    . rosuvastatin (CRESTOR) 5 MG tablet Take 1 tablet (5 mg total) by mouth daily. 90 tablet 3  . Zinc 30 MG TABS Take 30 mg by mouth daily.      No current facility-administered medications on file prior to visit.    Allergies  Allergen Reactions  . Penicillins Swelling    Reaction: Childhood  . Erythromycin Nausea And Vomiting  . Sulfa Antibiotics Nausea And Vomiting  . Sulfasalazine Nausea And Vomiting      Review of Systems ROS Review of Systems - General ROS: negative for - chills, fatigue, fever, hot flashes, night sweats, weight gain or weight loss Psychological ROS: negative for - anxiety, decreased libido, depression, mood swings, physical abuse or sexual abuse Ophthalmic ROS: negative for - blurry vision, eye pain or loss of vision ENT ROS: negative for - headaches, hearing change, visual  changes or vocal changes Allergy and Immunology ROS: negative for - hives, itchy/watery eyes or seasonal allergies Hematological and Lymphatic ROS: negative for - bleeding problems, bruising, swollen lymph nodes or weight loss Endocrine ROS: negative for - galactorrhea, hair pattern changes, hot flashes, malaise/lethargy, mood swings, palpitations, polydipsia/polyuria, skin changes, temperature intolerance or unexpected weight changes Breast ROS: negative for - new or changing breast lumps or nipple discharge Respiratory ROS: negative for - cough or shortness of breath Cardiovascular ROS: negative for - chest pain, irregular heartbeat, palpitations or shortness of breath Gastrointestinal ROS: no abdominal pain,  or black or bloody stools. Positive for mild constipation.  Genito-Urinary ROS: no dysuria,  trouble voiding, or hematuria. Positive for dyspareunia, vaginal dryness and tightness, also vaginal discharge (see above).  Musculoskeletal ROS: negative for - joint pain or joint stiffness Neurological ROS: negative for - bowel and bladder control changes Dermatological ROS: negative for rash and skin lesion changes   Objective:   BP (!) 144/87   Pulse 87   Ht '5\' 5"'  (1.651 m)   Wt 117 lb 4.8 oz (53.2 kg)   BMI 19.52 kg/m  CONSTITUTIONAL: Well-developed, well-nourished female in no acute distress.  PSYCHIATRIC: Normal mood and affect. Normal behavior. Normal judgment and thought content. [place]: Alert and oriented to person, place, and time. Normal muscle tone coordination. No cranial nerve deficit noted. HENT:  Normocephalic, atraumatic, External right and left ear normal. Oropharynx is clear and moist EYES: Conjunctivae and EOM are normal. Pupils are equal, round, and reactive to light. No scleral icterus.  NECK: Normal range of motion, supple, no masses.  Normal thyroid.  SKIN: Skin is warm and dry. No rash noted. Not diaphoretic. No erythema. No pallor. CARDIOVASCULAR: Normal heart  rate noted, regular rhythm, no murmur. RESPIRATORY: Clear to auscultation bilaterally. Effort and breath sounds normal, no problems with respiration noted. BREASTS: Symmetric in size. No masses, skin changes, nipple drainage, or lymphadenopathy.  Mild tenderness in right axillary region. Lumpectomy scar present  ABDOMEN: Soft, normal bowel sounds, no distention noted.  No tenderness, rebound or guarding.  BLADDER: Normal PELVIC:  Bladder no bladder distension noted  Urethra: normal appearing urethra with no masses, tenderness or lesions  Vulva: normal appearing vulva with no masses, tenderness or lesions  Vagina: moderate to severe vaginal atrophy, vaginal narrowing. No lesions.   Cervix: surgically absent  Uterus: surgically absent, vaginal cuff well healed  Adnexa: normal adnexa in size, nontender and no masses  RV: External Exam NormaI and No Rectal Masses  MUSCULOSKELETAL: Normal range of motion. No tenderness.  No cyanosis, clubbing, or edema.  2+ distal pulses. LYMPHATIC: No Axillary, Supraclavicular, or Inguinal Adenopathy.   Labs: Lab Results  Component Value Date   WBC 5.4 03/30/2020   HGB 14.0 03/30/2020   HCT 40.3 03/30/2020   MCV 85.0 03/30/2020   PLT 149 (L) 03/30/2020    Lab Results  Component Value Date   CREATININE 0.83 03/30/2020   BUN 17 03/30/2020   NA 138 03/30/2020   K 3.8 03/30/2020   CL 102 03/30/2020   CO2 26 03/30/2020    Lab Results  Component Value Date   ALT 28 12/04/2019   AST 35 12/04/2019   ALKPHOS 137 (H) 12/04/2019   BILITOT 1.0 12/04/2019    No results found for: CHOL, HDL, LDLCALC, LDLDIRECT, TRIG, CHOLHDL  Lab Results  Component Value Date   TSH 3.740 12/04/2019    No results found for: HGBA1C   Assessment:   1. Establishing care with new doctor, encounter for   2. Well woman exam with routine gynecological exam   3. Atrophic vaginitis   4. Vaginal stenosis   5. History of breast cancer   6. Constipation, unspecified  constipation type   7. Vaginal discharge     Plan:  - Pap: Not needed. Beyond age for screening and has had hysterectomy.  - Mammogram: Up to date. Next due in June. Ordered by PCP - Stool Guaiac Testing:  Not Ordered. Up to date. Due in 3-4 years.  - Labs: Reviewed. Has seen GI for elevation in Alk Phos level.  - Routine preventative health maintenance measures emphasized: Exercise/Diet/Weight control, Tobacco  Warnings, Alcohol/Substance use risks and Stress Management - Atrophic vaginitis - likely the cause of her discharge.   Discussed that this was normal.  Given recommendations on other alternative for treatment, including Intrarosa.  Patient very interested, would like to try. Prescription given, and info given.  - H/o breast cancer - doing well, sees Oncologist yearly.  Currently on no current treatment regimen. Mammograms up to date.  - Constipation, mild. Discussed OTC regimens including increasing fiber, water intake, use of stool softeners, or prune/apple juice.  - Osteoporosis - currently not on any regimented treatment. To discuss with PCP.  Return to Clinic - 1 month after initiation of Intrarosa.  RTC in 1 Year for physical exam.    Rubie Maid, MD  [facility]

## 2020-05-04 NOTE — Progress Notes (Signed)
Pt present as new pt to est care. Pt stated that she was doing well.

## 2020-05-06 ENCOUNTER — Encounter: Payer: Self-pay | Admitting: Obstetrics and Gynecology

## 2020-05-06 ENCOUNTER — Telehealth: Payer: Self-pay | Admitting: Obstetrics and Gynecology

## 2020-05-06 DIAGNOSIS — N895 Stricture and atresia of vagina: Secondary | ICD-10-CM | POA: Insufficient documentation

## 2020-05-06 DIAGNOSIS — N952 Postmenopausal atrophic vaginitis: Secondary | ICD-10-CM | POA: Insufficient documentation

## 2020-05-06 DIAGNOSIS — Z0001 Encounter for general adult medical examination with abnormal findings: Secondary | ICD-10-CM | POA: Diagnosis not present

## 2020-05-06 DIAGNOSIS — K760 Fatty (change of) liver, not elsewhere classified: Secondary | ICD-10-CM | POA: Diagnosis not present

## 2020-05-06 DIAGNOSIS — R413 Other amnesia: Secondary | ICD-10-CM | POA: Diagnosis not present

## 2020-05-06 DIAGNOSIS — Z23 Encounter for immunization: Secondary | ICD-10-CM | POA: Diagnosis not present

## 2020-05-06 DIAGNOSIS — M810 Age-related osteoporosis without current pathological fracture: Secondary | ICD-10-CM | POA: Insufficient documentation

## 2020-05-06 DIAGNOSIS — Z681 Body mass index (BMI) 19 or less, adult: Secondary | ICD-10-CM | POA: Diagnosis not present

## 2020-05-06 DIAGNOSIS — I4892 Unspecified atrial flutter: Secondary | ICD-10-CM | POA: Diagnosis not present

## 2020-05-06 NOTE — Telephone Encounter (Signed)
New message    Pt c/o medication issue:  1. Name of Medication:  Prasterone (INTRAROSA) 6.5 MG INST  2. How are you currently taking this medication (dosage and times per day)? insert  3. Are you having a reaction (difficulty breathing--STAT)? no  4. What is your medication issue? H/o Breast cancer, Unable to take due to estrogen.

## 2020-05-07 NOTE — Telephone Encounter (Signed)
Spoke to pt concerning her call to the office. Pt stated that she was unable to take the medication Intrarosa due to she has a hx of breast cancer. Pt is requested another form of medication to help with her symptoms.

## 2020-05-07 NOTE — Telephone Encounter (Signed)
Please advise. Thanks Fikisha 

## 2020-05-08 NOTE — Telephone Encounter (Signed)
Please inform patient that the [place] is actually ok to take, even with her history of breast cancer, unless her breast cancer was also androgen receptor positive. And she does not have to use the Intrarosa for as long as she previously used the estrogen therapy.   Dr. Marcelline Mates

## 2020-05-10 ENCOUNTER — Encounter: Payer: Self-pay | Admitting: Cardiology

## 2020-05-10 ENCOUNTER — Ambulatory Visit: Payer: Medicare PPO | Admitting: Cardiology

## 2020-05-10 ENCOUNTER — Other Ambulatory Visit: Payer: Self-pay

## 2020-05-10 VITALS — BP 154/86 | HR 65 | Ht 65.0 in | Wt 117.0 lb

## 2020-05-10 DIAGNOSIS — I471 Supraventricular tachycardia: Secondary | ICD-10-CM

## 2020-05-10 NOTE — Patient Instructions (Signed)
Medication Instructions:  Your physician recommends that you continue on your current medications as directed. Please refer to the Current Medication list given to you today.  *If you need a refill on your cardiac medications before your next appointment, please call your pharmacy*   Lab Work: None ordered   Testing/Procedures: None ordered   Follow-Up: At [facility], you and your health needs are our priority.  As part of our continuing mission to provide you with exceptional heart care, we have created designated Provider Care Teams.  These Care Teams include your primary Cardiologist (physician) and Advanced Practice Providers (APPs -  Physician Assistants and Nurse Practitioners) who all work together to provide you with the care you need, when you need it.  Your next appointment:   6 month(s)  The format for your next appointment:   In Person  Provider:   Allegra Lai, MD    Thank you for choosing [facility]!!   Trinidad Curet, RN [phone]   Other Instructions  Diltiazem Tablets What is this medicine? DILTIAZEM (dil TYE a zem) is a calcium channel blocker. It relaxes your blood vessels and decreases the amount of work the heart has to do. It treats and/or prevents chest pain (also called angina). This medicine may be used for other purposes; ask your health care provider or pharmacist if you have questions. COMMON BRAND NAME(S): Cardizem What should I tell my health care provider before I take this medicine? They need to know if you have any of these conditions:  heart attack  heart disease  irregular heartbeat or rhythm  low blood pressure  an unusual or allergic reaction to diltiazem, other drugs, foods, dyes, or preservatives  pregnant or trying to get pregnant  breast-feeding How should I use this medicine? Take this drug by mouth. Take it as directed on the prescription label at the same time every day. Keep taking it unless your health  care provider tells you to stop. Talk to your health care provider about the use of this drug in children. Special care may be needed. Overdosage: If you think you have taken too much of this medicine contact a poison control center or emergency room at once. NOTE: This medicine is only for you. Do not share this medicine with others. What if I miss a dose? If you miss a dose, take it as soon as you can. If it is almost time for your next dose, take only that dose. Do not take double or extra doses. What may interact with this medicine? Do not take this medicine with any of the following:  cisapride  haw[place]orn  pimozide  ranolazine  red yeast rice This medicine may also interact with the following medications:  buspirone  carbamazepine  cimetidine  cyclosporine  digoxin  local anesthetics or general anesthetics  lovastatin  medicines for anxiety or difficulty sleeping like midazolam and triazolam  medicines for high blood pressure or heart problems  quinidine  rifampin, rifabutin, or rifapentine This list may not describe all possible interactions. Give your health care provider a list of all the medicines, herbs, non-prescription drugs, or dietary supplements you use. Also tell them if you smoke, drink alcohol, or use illegal drugs. Some items may interact with your medicine. What should I watch for while using this medicine? Visit your care team for regular checks on your progress. Check your blood pressure as directed. Ask your care team what your blood pressure should be. Also, find out when you should  contact them. Do not treat yourself for coughs, colds, or pain while you are using this medication without asking your care team for advice. Some medications may increase your blood pressure. This medication may cause serious skin reactions. They can happen weeks to months after starting the medication. Contact your care team right away if you notice fevers or flu-like  symptoms with a rash. The rash may be red or purple and then turn into blisters or peeling of the skin. Or, you might notice a red rash with swelling of the face, lips or lymph nodes in your neck or under your arms. You may get drowsy or dizzy. Do not drive, use machinery, or do anything that needs mental alertness until you know how this medication affects you. Do not stand up or sit up quickly, especially if you are an older patient. This reduces the risk of dizzy or fainting spells. What side effects may I notice from receiving this medicine? Side effects that you should report to your doctor or health care provider as soon as possible:  allergic reactions (skin rash, itching or hives; swelling of the face, lips, or tongue)  heart failure (trouble breathing; fast, irregular heartbeat; sudden weight gain; swelling of the ankles, feet, hands; unusually weak or tired)  heartbeat rhythm changes (trouble breathing; chest pain; dizziness; fast, irregular heartbeat; feeling faint or lightheaded, falls)  liver injury (dark yellow or brown urine; general ill feeling or flu-like symptoms; loss of appetite, right upper belly pain; unusually weak or tired, yellowing of the eyes or skin)  low blood pressure (dizziness; feeling faint or lightheaded, falls; unusually weak or tired)  redness, blistering, peeling, or loosening of the skin, including inside the mouth Side effects that usually do not require medical attention (report to your doctor or health care provider if they continue or are bothersome):  changes in sex drive or performance  depressed mood  headache  sudden weight gain  nausea  trouble sleeping This list may not describe all possible side effects. Call your doctor for medical advice about side effects. You may report side effects to FDA at 1-800-FDA-1088. Where should I keep my medicine? Keep out of the reach of children and pets. Store at room temperature between 15 and 30  degrees C (59 and 86 degrees F). Protect from moisture. Keep the container tightly closed. Throw away any unused drug after the expiration date. NOTE: This sheet is a summary. It may not cover all possible information. If you have questions about this medicine, talk to your doctor, pharmacist, or health care provider.  2021 Elsevier/Gold Standard (2019-12-18 15:46:34)

## 2020-05-10 NOTE — Progress Notes (Signed)
Electrophysiology Office Note   Date:  05/10/2020   ID:  MADISSEN WYSE, DOB Jun 03, 1955, MRN [id number]  PCP:  Caryl Bis, MD  Cardiologist:  Rockey Situ Primary Electrophysiologist:  Chan Rosasco Meredith Leeds, MD    Chief Complaint: Atrial flutter   History of Present Illness: Theresa Hale is a 65 y.o. female who is being seen today for the evaluation of atrial flutter at the request of Caryl Bis, MD. Presenting today for electrophysiology evaluation.  She has a history significant for atrial flutter and SVT.  She also has breast cancer, hypertension, and an elevated coronary calcium score.  She developed atrial flutter 12/04/2019.  She went to the emergency room and underwent cardioversion.  She had 2 more episodes of atrial flutter as well as SVT.  SVT lasts for a minute at a time and terminates with Valsalva.  She is now status post atrial flutter ablation 12/26/2019.  Today, denies symptoms of palpitations, chest pain, shortness of breath, orthopnea, PND, lower extremity edema, claudication, dizziness, presyncope, syncope, bleeding, or neurologic sequela. The patient is tolerating medications without difficulties.  Since last being seen she has done well.  She has no chest pain or shortness of breath.  She did have 2 episodes of SVT last week.  One was while riding an exercise bike, the other was while unloading the dishwasher.  Each of them lasted for 1 minute approximately.  Prior to that, she felt well and had a minimal episode.   Past Medical History:  Diagnosis Date  . Atrial flutter ([place])    a. Dx in 2018. Single episode. Not on [place] (CHA2DS2VASc = 2).  . Breast cancer ([place])   . Elevated coronary artery calcium score    a. 07/2019 Cardiac CT: Ca2+ 262 (90th %'ile).  . GERD (gastroesophageal reflux disease)   . History of echocardiogram    a. 06/2016 Echo: EF 60-65%, no rwma. Mild TR.  Marland Kitchen Personal history of radiation therapy 02/2018  . PONV (postoperative nausea and vomiting)    . PSVT (paroxysmal supraventricular tachycardia) ([place])    a. 06/2019 Zio: RSR, avg HR 71, max HR 222. 97 runs of SVT, fastest 222 (4 beats), longest 1:05 (164).   Past Surgical History:  Procedure Laterality Date  . A-FLUTTER ABLATION N/A 12/26/2019   Procedure: A-FLUTTER ABLATION;  Surgeon: Constance Haw, MD;  Location: [facility];  Service: Cardiovascular;  Laterality: N/A;  . ABDOMINAL HYSTERECTOMY    . ABLATION    . BREAST BIOPSY Right 10/02/2017  . BREAST EXCISIONAL BIOPSY Right 01/2002  . BREAST LUMPECTOMY Right 2019  . BREAST LUMPECTOMY WITH RADIOACTIVE SEED AND SENTINEL LYMPH NODE BIOPSY Right 11/26/2017   Procedure: RIGHT BREAST LUMPECTOMY WITH SENTINEL NODE MAPPING AND TARGETED NODE DISECTION ERAS PATHWAY;  Surgeon: Jovita Kussmaul, MD;  Location: [facility];  Service: General;  Laterality: Right;  . CESAREAN SECTION    . CHOLECYSTECTOMY    . KNEE ARTHROSCOPY    . NECK SURGERY       Current Outpatient Medications  Medication Sig Dispense Refill  . Cholecalciferol (DIALYVITE VITAMIN D 5000 PO) Take 5,000 Units by mouth daily.    Marland Kitchen ELDERBERRY PO Take by mouth.    . metoprolol succinate (TOPROL-XL) 25 MG 24 hr tablet TAKE 1/2 (ONE-HALF) TABLET BY MOUTH AT BEDTIME 45 tablet 2  . OVER THE COUNTER MEDICATION Calcium 600 mg one daily    . rosuvastatin (CRESTOR) 5 MG tablet Take 1 tablet (5  mg total) by mouth daily. 90 tablet 3  . Zinc 30 MG TABS Take 30 mg by mouth daily.      No current facility-administered medications for this visit.    Allergies:   Penicillins, Erythromycin, Sulfa antibiotics, and Sulfasalazine   Social History:  The patient  reports that she has never smoked. She has never used smokeless tobacco. She reports that she does not drink alcohol and does not use drugs.   Family History:  The patient's family history includes Alzheimer's disease in her father; Breast cancer (age of onset: 33) in her sister; Breast cancer (age of  onset: 58) in her mother; Parkinson's disease in her father; Stroke in her mother.    ROS:  Please see the history of present illness.   Otherwise, review of systems is positive for none.   All other systems are reviewed and negative.   PHYSICAL EXAM: VS:  BP (!) 154/86   Pulse 65   Ht 5\' 5"  (1.651 m)   Wt 117 lb (53.1 kg)   BMI 19.47 kg/m  , BMI Body mass index is 19.47 kg/m. GEN: Well nourished, well developed, in no acute distress  HEENT: normal  Neck: no JVD, carotid bruits, or masses Cardiac: RRR; no murmurs, rubs, or gallops,no edema  Respiratory:  clear to auscultation bilaterally, normal work of breathing GI: soft, nontender, nondistended, + BS MS: no deformity or atrophy  Skin: warm and dry Neuro:  Strength and sensation are intact Psych: euthymic mood, full affect  EKG:  EKG is ordered today. Personal review of the ekg ordered shows sinus rhythm, rate 65  Recent Labs: 12/04/2019: ALT 28; B Natriuretic Peptide 385.8; Magnesium 2.1; TSH 3.740 03/30/2020: BUN 17; Creatinine, Ser 0.83; Hemoglobin 14.0; Platelets 149; Potassium 3.8; Sodium 138    Lipid Panel  No results found for: CHOL, TRIG, HDL, CHOLHDL, VLDL, LDLCALC, LDLDIRECT   Wt Readings from Last 3 Encounters:  05/10/20 117 lb (53.1 kg)  05/04/20 117 lb 4.8 oz (53.2 kg)  03/30/20 113 lb (51.3 kg)      Other studies Reviewed: Additional studies/ records that were reviewed today include: TTE 06/21/16  Review of the above records today demonstrates:  - Left ventricle: The cavity size was normal. Wall thickness was  normal. Systolic function was normal. The estimated ejection  fraction was in the range of 60% to 65%. Wall motion was normal;  there were no regional wall motion abnormalities. There was an  increased relative contribution of atrial contraction to  ventricular filling. Left ventricular diastolic function  parameters were normal.  - Aortic valve: Mildly calcified annulus. Probably  trileaflet.  - Mitral valve: Mildly calcified annulus.  - Tricuspid valve: There was mild regurgitation.   Cardiac monitor 08/13/2019 personally reviewed Normal sinus rhythm avg HR of 71 bpm. max HR of 222 bpm  97 Supraventricular Tachycardia runs occurred, the run with the fastest interval lasting 4 beats with a max rate of 222 bpm, the longest lasting 1 min 5 secs with an avg rate of 164 bpm.  Supraventricular Tachycardia was detected within +/- 45 seconds of symptomatic patient event(s).   Junctional Rhythm was present.   Isolated SVEs were rare (<1.0%), SVE Couplets were rare (<1.0%), and SVE Triplets were rare (<1.0%). Isolated VEs were rare (<1.0%), VE Couplets were rare (<1.0%), and no VE Triplets were present.  ASSESSMENT AND PLAN:  1.  Typical atrial flutter: Status post ablation 12/26/2019.  No obvious recurrence  2.  SVT: Found on cardiac  monitor with rates between 160 and 220.  EP study during atrial flutter ablation showed no inducible arrhythmias.  She has had minimal episodes over the past few months.  She had 2 episodes last week that lasted for a minute apiece.  She has been taking very low-dose metoprolol.  I told her that if she continues to have episodes and does not wish to increase her metoprolol dose, can switch her to diltiazem.  Current medicines are reviewed at length with the patient today.   The patient does not have concerns regarding her medicines.  The following changes were made today: None  Labs/ tests ordered today include:  Orders Placed This Encounter  Procedures  . EKG 12-Lead     Disposition:   FU with Dearies Meikle 6 months  Signed, Sebastion Jun Meredith Leeds, MD  05/10/2020 3:26 PM     [facility] [address] [place] [address] [phone] (office) [phone] (fax)

## 2020-05-12 NOTE — Telephone Encounter (Signed)
Please advise. Thanks Laree Garron 

## 2020-05-12 NOTE — Telephone Encounter (Signed)
Spoke to pt and informed her of the information given by [facility]. Pt stated that she has been on the website for [facility] and saw where they stated that if you had breast cancer to not take the medication. Pt stated that her body reacts differently to medication and she did not want to take a chance of taking the medication. Pt is requesting another form of medication.

## 2020-05-12 NOTE — Telephone Encounter (Signed)
Unfortunately the only options that do not contain hormones are to continue the use of the vaginal moisturizers and use of coconut oil and lubricants (which I believe she has already been trying).

## 2020-05-19 NOTE — Telephone Encounter (Signed)
Spoke to pt and she is aware of the information given by [facility]. Pt stated that she would just continue with her normal medication and routine. Pt cancelled her follow up for medication review.

## 2020-06-03 ENCOUNTER — Encounter: Payer: Medicare PPO | Admitting: Obstetrics and Gynecology

## 2020-06-17 ENCOUNTER — Ambulatory Visit: Payer: Medicare PPO | Admitting: Nurse Practitioner

## 2020-07-01 ENCOUNTER — Ambulatory Visit (INDEPENDENT_AMBULATORY_CARE_PROVIDER_SITE_OTHER): Payer: Medicare PPO

## 2020-07-01 ENCOUNTER — Other Ambulatory Visit: Payer: Self-pay

## 2020-07-01 ENCOUNTER — Encounter: Payer: Self-pay | Admitting: Nurse Practitioner

## 2020-07-01 ENCOUNTER — Ambulatory Visit: Payer: Medicare PPO | Admitting: Nurse Practitioner

## 2020-07-01 VITALS — BP 142/80 | HR 64 | Ht 65.0 in | Wt 116.0 lb

## 2020-07-01 DIAGNOSIS — I471 Supraventricular tachycardia: Secondary | ICD-10-CM | POA: Diagnosis not present

## 2020-07-01 DIAGNOSIS — R55 Syncope and collapse: Secondary | ICD-10-CM

## 2020-07-01 DIAGNOSIS — I4892 Unspecified atrial flutter: Secondary | ICD-10-CM

## 2020-07-01 DIAGNOSIS — I2510 Atherosclerotic heart disease of native coronary artery without angina pectoris: Secondary | ICD-10-CM | POA: Diagnosis not present

## 2020-07-01 DIAGNOSIS — E782 Mixed hyperlipidemia: Secondary | ICD-10-CM | POA: Diagnosis not present

## 2020-07-01 NOTE — Patient Instructions (Signed)
Medication Instructions:  No changes at this time.  *If you need a refill on your cardiac medications before your next appointment, please call your pharmacy*   Lab Work: None  If you have labs (blood work) drawn today and your tests are completely normal, you will receive your results only by: Marland Kitchen MyChart Message (if you have MyChart) OR . A paper copy in the mail If you have any lab test that is abnormal or we need to change your treatment, we will call you to review the results.   Testing/Procedures: Your physician has recommended that you wear a Zio AT Live monitor for 2 weeks.   This monitor is a medical device that records the heart's electrical activity. Doctors most often use these monitors to diagnose arrhythmias. Arrhythmias are problems with the speed or rhythm of the heartbeat. The monitor is a small device applied to your chest. You can wear one while you do your normal daily activities. While wearing this monitor if you have any symptoms to push the button and record what you felt. Once you have worn this monitor for the period of time provider prescribed (Usually 14 days), you will return the monitor device in the postage paid box/bag. Once it is returned they will download the data collected and provide [place] with a report which the provider will then review and we will call you with those results.   Important tips:  1. Avoid showering during the first 24 hours of wearing the monitor. 2. Avoid excessive sweating to help maximize wear time. 3. Do not submerge the device, no hot tubs, and no swimming pools. 4. Keep any lotions or oils away from the patch. 5. After 24 hours you may shower with the patch on. Take brief showers with your back facing the shower head.  6. Do not remove patch once it has been placed because that will interrupt data and decrease adhesive wear time. 7. Push the button when you have any symptoms and write down what you were feeling. 8. Once you have  completed wearing your monitor, remove and place into box which has postage paid and place in your outgoing mailbox.  9. If for some reason you have misplaced your box then call our office and we can provide another box and/or mail it off for you. 10. Keep the transmitter within 10 feet at all times.  11. Expect a welcome phone call within 37-16 hrs of application from [place].  This call will include your copay information, so please answer any unknown phone calls while wearing Zio (it could also be important information about your heart) 12. The envelope to return Elwyn Reach is in the back of the transmitter. Removal instructions are on the last page of the symptom diary.  13. Place the patch sticky side up inside the transmitter and the symptom diary inside the envelope to return on your last wear day inside your mailbox or any USPS mailbox.     Follow-Up: At [facility], you and your health needs are our priority.  As part of our continuing mission to provide you with exceptional heart care, we have created designated Provider Care Teams.  These Care Teams include your primary Cardiologist (physician) and Advanced Practice Providers (APPs -  Physician Assistants and Nurse Practitioners) who all work together to provide you with the care you need, when you need it.   Your next appointment:   4-6 weeks  The format for your next appointment:   In Person  Provider:   Dr. Allegra Lai

## 2020-07-01 NOTE — Progress Notes (Signed)
Office Visit    Patient Name: Theresa Hale Date of Encounter: 07/01/2020  Primary Care Provider:  Caryl Bis, MD Primary Cardiologist:  Ida Rogue, MD  Chief Complaint    65 year old female with a history of atrial flutter, breast cancer, elevated coronary calcium score, hyperlipidemia, GERD, and PSVT, who presents for follow-up related to presyncope.  Past Medical History    Past Medical History:  Diagnosis Date  . Atrial flutter ([place])    a. Dx in 2018. Initially only a single episode. (CHA2DS2VASc = 2); b. 11/2020 Recurrent Aflutter s/p DCCV. Eliquis started; c. 12/2020 s/p RFCA; d. 01/2021 Eliquis d/c'd.  . Breast cancer ([place])   . Elevated coronary artery calcium score    a. 07/2019 Cardiac CT: Ca2+ 262 (90th %'ile).  . GERD (gastroesophageal reflux disease)   . History of echocardiogram    a. 06/2016 Echo: EF 60-65%, no rwma. Mild TR.  Marland Kitchen Personal history of radiation therapy 02/2018  . PONV (postoperative nausea and vomiting)   . PSVT (paroxysmal supraventricular tachycardia) ([place])    a. 06/2019 Zio: RSR, avg HR 71, max HR 222. 97 runs of SVT, fastest 222 (4 beats), longest 1:05 (164).   Past Surgical History:  Procedure Laterality Date  . A-FLUTTER ABLATION N/A 12/26/2019   Procedure: A-FLUTTER ABLATION;  Surgeon: Constance Haw, MD;  Location: [facility];  Service: Cardiovascular;  Laterality: N/A;  . ABDOMINAL HYSTERECTOMY    . ABLATION    . BREAST BIOPSY Right 10/02/2017  . BREAST EXCISIONAL BIOPSY Right 01/2002  . BREAST LUMPECTOMY Right 2019  . BREAST LUMPECTOMY WITH RADIOACTIVE SEED AND SENTINEL LYMPH NODE BIOPSY Right 11/26/2017   Procedure: RIGHT BREAST LUMPECTOMY WITH SENTINEL NODE MAPPING AND TARGETED NODE DISECTION ERAS PATHWAY;  Surgeon: Jovita Kussmaul, MD;  Location: [facility];  Service: General;  Laterality: Right;  . CESAREAN SECTION    . CHOLECYSTECTOMY    . KNEE ARTHROSCOPY    . NECK SURGERY       Allergies  Allergies  Allergen Reactions  . Penicillins Swelling    Reaction: Childhood  . Erythromycin Nausea And Vomiting  . Sulfa Antibiotics Nausea And Vomiting  . Sulfasalazine Nausea And Vomiting    History of Present Illness    65 year old female with history of atrial flutter, breast cancer, elevated coronary calcium score, hyperlipidemia, GERD, and PSVT.  She previously had an episode of atrial flutter in 2018, that was first identified by her primary care provider.  She was placed on Toprol-XL 25 mg and after 2 doses, converted to sinus rhythm.  She was seen by cardiology in [place] (Dr. Harl Bowie) at the time and in the setting of a CHA2DS2-VASc of 1, she was placed on aspirin only.  Echocardiogram showed an EF of 60 to 65% with mild TR.  In the spring 2021, she was having palpitations and underwent monitoring which showed 97 runs of PSVT, the fastest of which being 222 bpm x 4 beats and the longest lasting 1 minute and 5 seconds.  Beta-blocker usage was limited by relative hypotension and therefore, she took as needed only.  Coronary calcium scoring in June 2021 was elevated at 262, placing her in the 90th percentile.  She has been managed with rosuvastatin therapy.  She was seen in clinic on December 04, 2019 in the setting of development of atrial flutter with RVR.  This persisted despite vagal maneuvers and taking metoprolol.  She was referred to the ED for  cardioversion, which was successfully performed and then she was placed on Eliquis and discharged.  She was then referred to EP and underwent successful catheter ablation on November 12.  She was last seen in electrophysiology clinic on March 28, at which time she reported 2 episodes of SVT, one while riding her bike and the other while unloading the dishwasher.  Each of them lasted about a minute, and resolve spontaneously.  She has been tolerating metoprolol XL 12.5 mg daily.  Since her EP visit in March, she has mostly done  well though notes that about 2 weeks ago, she was standing in her bathroom, several minutes after getting out of the shower when she had sudden onset of presyncope in the absence of palpitations.  She could see black spots in her visual fields and felt like she might lose consciousness.  Her husband assisted her to her bed where symptoms resolved within 4 to 5 minutes.  They did check her blood pressure during symptoms and her systolic was 89.  Her blood pressure cuff did not report a heart rate at the time.  She had never had presyncope before and has not had any additional episodes.  She notes that she can usually feel her tachypalpitations in her chest and that the episode of presyncope was different from anything she had ever experienced.  She denies chest pain, dyspnea, palpitations, PND, orthopnea, syncope, edema, or early satiety.  Home Medications    Prior to Admission medications   Medication Sig Start Date End Date Taking? Authorizing Provider  Cholecalciferol (DIALYVITE VITAMIN D 5000 PO) Take 5,000 Units by mouth daily.   Yes [provider]  ELDERBERRY PO Take by mouth.   Yes [provider]  ipratropium (ATROVENT) 0.03 % nasal spray Place 1 spray into the nose daily. 04/01/00  Yes [provider]  metoprolol succinate (TOPROL-XL) 25 MG 24 hr tablet TAKE 1/2 (ONE-HALF) TABLET BY MOUTH AT BEDTIME 01/19/20  Yes Loel Dubonnet, NP  omeprazole (PRILOSEC) 20 MG capsule Take 20 mg by mouth daily. 03/30/00  Yes [provider]  OVER THE COUNTER MEDICATION Calcium 600 mg one daily   Yes [provider]  rosuvastatin (CRESTOR) 5 MG tablet Take 5 mg by mouth daily.   Yes [provider]  Zinc 30 MG TABS Take 30 mg by mouth daily.  10/16/19  Yes [provider]    Review of Systems    Presyncopal episode about 2 weeks ago.  No recurrence.  She denies chest pain, dyspnea, palpitations, PND, orthopnea, syncope, edema, or early satiety.   All other systems reviewed and are otherwise negative except as noted above.  Physical Exam    VS:  BP (!) 142/80   Pulse 64   Ht 5\' 5"  (1.651 m)   Wt 116 lb (52.6 kg)   BMI 19.30 kg/m  , BMI Body mass index is 19.3 kg/m. GEN: Well nourished, thin, in no acute distress. HEENT: normal. Neck: Supple, no JVD, carotid bruits, or masses. Cardiac: RRR, no murmurs, rubs, or gallops. No clubbing, cyanosis, edema.  Radials/PT 2+ and equal bilaterally.  Respiratory:  Respirations regular and unlabored, clear to auscultation bilaterally. GI: Soft, nontender, nondistended, BS + x 4. MS: no deformity or atrophy. Skin: warm and dry, no rash. Neuro:  Strength and sensation are intact. Psych: Normal affect.  Accessory Clinical Findings    ECG personally reviewed by me today -regular sinus rhythm, 64- no acute changes.  Lab Results  Component Value  Date   WBC 5.4 03/30/2020   HGB 14.0 03/30/2020   HCT 40.3 03/30/2020   MCV 85.0 03/30/2020   PLT 149 (L) 03/30/2020   Lab Results  Component Value Date   CREATININE 0.83 03/30/2020   BUN 17 03/30/2020   NA 138 03/30/2020   K 3.8 03/30/2020   CL 102 03/30/2020   CO2 26 03/30/2020   Lab Results  Component Value Date   ALT 28 12/04/2019   AST 35 12/04/2019   ALKPHOS 137 (H) 12/04/2019   BILITOT 1.0 12/04/2019    Assessment & Plan    1.  Presyncope: Approximately 2 weeks ago, patient had a presyncopal spell while standing in her bathroom.  She notes that this was several minutes after taking a shower and she was no longer feeling hot or flushed at the time.  There was no associated nausea or diaphoresis.  Symptoms came on suddenly and were associated with seeing black spots and feeling as though she might lose consciousness.  Symptoms resolved within 45 minutes of lying down.  Systolic blood pressure was 89 during the episode.  Her blood pressure cuff did not report a heart rate, and she is not sure as to why.  She did not feel any  palpitations during the episode.  She has not had any recurrent symptoms.  She does have a history of paroxysmal atrial flutter status post catheter ablation as well as paroxysmal SVT for which she takes low-dose metoprolol and generally has been tolerating this.  I will place a 2-week Zio monitor to reevaluate arrhythmic burden as a high burden will likely guide future therapy in the absence of an ability to titrate medications due to history of soft blood pressures.  2.  Paroxysmal atrial flutter: Status post catheter ablation in November 2022.  She does not think she had atrial flutter 2 weeks ago, as that typically causes very specific symptoms.  She remains on low-dose metoprolol therapy.  Eliquis was discontinued 1 month after ablation.  3.  PSVT: She does not believe that she had any recent episodes but cannot be sure that her presyncopal spell was not associated with PSVT.  Placing monitor as above.  Continue low-dose metoprolol.  If high burden of PSVT, would consider switching to low-dose diltiazem.  She will also follow-up with electrophysiology for consideration of catheter ablation if appropriate.  4.  Elevated coronary calcium/HL: On statin therapy with LDL of 55 by lab work in September 2021.  5.  Disposition: Follow-up Zio monitoring.  Follow-up with electrophysiology in approximately 4 to 6 weeks.   Murray Hodgkins, NP 07/01/2020, 3:20 PM

## 2020-07-02 DIAGNOSIS — R55 Syncope and collapse: Secondary | ICD-10-CM | POA: Diagnosis not present

## 2020-08-03 ENCOUNTER — Telehealth: Payer: Self-pay | Admitting: Cardiovascular Disease

## 2020-08-03 NOTE — Telephone Encounter (Signed)
Spoke with patient and reviewed that report is pending provider review. She has upcoming appointment with Dr. Curt Bears and wanted to see if he would have access to this report. Advised that it is available in the chart but just waiting on final interpretation by provider. I did review preliminary results but that someone would call with those final results as well. She verbalized understanding with no further questions at this time.

## 2020-08-03 NOTE — Telephone Encounter (Signed)
Patient calling to check the status of monitor results  Please call to discuss Would like to know if they will be available before her Dr Curt Bears appointment

## 2020-08-06 ENCOUNTER — Encounter: Payer: Self-pay | Admitting: Cardiology

## 2020-08-06 ENCOUNTER — Other Ambulatory Visit: Payer: Self-pay

## 2020-08-06 ENCOUNTER — Ambulatory Visit: Payer: Medicare PPO | Admitting: Cardiology

## 2020-08-06 VITALS — BP 118/82 | HR 56 | Ht 65.0 in | Wt 117.0 lb

## 2020-08-06 DIAGNOSIS — I471 Supraventricular tachycardia: Secondary | ICD-10-CM

## 2020-08-06 NOTE — Patient Instructions (Signed)
Medication Instructions:  Your physician has recommended you make the following change in your medication: STOP Metoprolol  *If you need a refill on your cardiac medications before your next appointment, please call your pharmacy*   Lab Work: None ordered   Testing/Procedures: Your physician has recommended that you have a loop recorder inserted. We will pre certify this with insurance, once approved the office will call you to make an appointment.    Follow-Up: At [facility], you and your health needs are our priority.  As part of our continuing mission to provide you with exceptional heart care, we have created designated Provider Care Teams.  These Care Teams include your primary Cardiologist (physician) and Advanced Practice Providers (APPs -  Physician Assistants and Nurse Practitioners) who all work together to provide you with the care you need, when you need it.  Your next appointment:   To be determined     The format for your next appointment:   In Person  Provider:   Allegra Lai, MD    Thank you for choosing [facility]!!   Trinidad Curet, RN [phone]   Other Instructions  Implantable Loop Recorder Placement  An implantable loop recorder is a small electronic device that is placed under the skin of your chest. The device records the electrical activity of your heart over a long period of time. Your health care provider can download theserecordings to monitor your heart. You may need an implantable loop recorder if you have periods of abnormal heart activity (arrhythmias) or unexplained fainting (syncope). The recorder can be left in place for 1 year or longer. Tell a health care provider about: Any allergies you have. All medicines you are taking, including vitamins, herbs, eye drops, creams, and over-the-counter medicines. Any problems you or family members have had with anesthetic medicines. Any blood disorders you have. Any surgeries you have  had. Any medical conditions you have. Whether you are pregnant or may be pregnant. What are the risks? Generally, this is a safe procedure. However, problems may occur, including: Infection. Bleeding. Allergic reactions to anesthetic medicines. Damage to nerves or blood vessels. Failure of the device to work. This could require another surgery to replace it. What happens before the procedure?  You may have a physical exam, blood tests, and imaging tests of your heart, such as a chest X-ray. Follow instructions from your health care provider about eating or drinking restrictions. Ask your health care provider about: Changing or stopping your regular medicines. This is especially important if you are taking diabetes medicines or blood thinners. Taking medicines such as aspirin and ibuprofen. These medicines can thin your blood. Do not take these medicines unless your health care provider tells you to take them. Taking over-the-counter medicines, vitamins, herbs, and supplements. Ask your health care provider how your surgical site will be marked or identified. Ask your health care provider what steps will be taken to help prevent infection. These may include: Removing hair at the surgery site. Washing skin with a germ-killing soap. Plan to have someone take you home from the hospital or clinic. Plan to have a responsible adult care for you for at least 24 hours after you leave the hospital or clinic. This is important. Do not use any products that contain nicotine or tobacco, such as cigarettes and e-cigarettes. If you need help quitting, ask your health care provider. What happens during the procedure? An IV will be inserted into one of your veins. You may be given one  or more of the following: A medicine to help you relax (sedative). A medicine to numb the area (local anesthetic). A small incision will be made on the left side of your upper chest. A pocket will be created under your  skin. The device will be placed in the pocket. The incision will be closed with stitches (sutures) or adhesive strips. A bandage (dressing) will be placed over the incision. The procedure may vary among health care providers and hospitals. What happens after the procedure? Your blood pressure, heart rate, breathing rate, and blood oxygen level will be monitored until you leave the hospital or clinic. You may be able to go home on the day of your surgery. Before you go home: Your health care provider will program your recorder. You will learn how to trigger your device with a handheld activator. You will learn how to send recordings to your health care provider. You will get an ID card for your device, and you will be told when to use it. Do not drive for 24 hours if you were given a sedative during your procedure. Summary An implantable loop recorder is a small electronic device that is placed under the skin of your chest to monitor your heart over a long period of time. The recorder can be left in place for 1 year or longer. Plan to have someone take you home from the hospital or clinic. This information is not intended to replace advice given to you by your health care provider. Make sure you discuss any questions you have with your healthcare provider. Document Revised: 10/21/2019 Document Reviewed: 03/17/2017 Elsevier Patient Education  2022 [employer].

## 2020-08-06 NOTE — Progress Notes (Signed)
Electrophysiology Office Note   Date:  08/06/2020   ID:  Theresa Hale, DOB [birth year], MRN [id number]  PCP:  Caryl Bis, MD  Cardiologist:  Rockey Situ Primary Electrophysiologist:  Tameia Rafferty Meredith Leeds, MD    Chief Complaint: Atrial flutter   History of Present Illness: Theresa Hale is a 65 y.o. female who is being seen today for the evaluation of atrial flutter at the request of Caryl Bis, MD. Presenting today for electrophysiology evaluation.  She has a history significant for atrial flutter and SVT.  She also has a history of breast cancer, hypertension, and elevated coronary calcium score.  She developed atrial flutter 12/04/2019.  She is now status post atrial flutter ablation 12/26/2019.  She has also had episodes of SVT, but during her flutter ablation, no SVT could be induced.  Today, denies symptoms of palpitations, chest pain, shortness of breath, orthopnea, PND, lower extremity edema, claudication, dizziness, presyncope, syncope, bleeding, or neurologic sequela. The patient is tolerating medications without difficulties.  Since being seen she has good days and bad days.  She has had multiple episodes of near syncope.  A few of them occurred when she was getting out of the shower.  She states that her blood pressure has been quite low when this happens.  She is unsure whether or not she is having palpitations, though her blood pressure cuff tells her that her heart rate is in the 80s.  She wore a cardiac monitor that showed no obvious arrhythmias.   Past Medical History:  Diagnosis Date   Atrial flutter ([place])    a. Dx in 2018. Initially only a single episode. (CHA2DS2VASc = 2); b. 11/2020 Recurrent Aflutter s/p DCCV. Eliquis started; c. 12/2020 s/p RFCA; d. 01/2021 Eliquis d/c'd.   Breast cancer ([facility])    Elevated coronary artery calcium score    a. 07/2019 Cardiac CT: Ca2+ 262 (90th %'ile).   GERD (gastroesophageal reflux disease)    History of echocardiogram    a.  06/2016 Echo: EF 60-65%, no rwma. Mild TR.   Personal history of radiation therapy 02/2018   PONV (postoperative nausea and vomiting)    PSVT (paroxysmal supraventricular tachycardia) ([place])    a. 06/2019 Zio: RSR, avg HR 71, max HR 222. 97 runs of SVT, fastest 222 (4 beats), longest 1:05 (164).   Past Surgical History:  Procedure Laterality Date   A-FLUTTER ABLATION N/A 12/26/2019   Procedure: A-FLUTTER ABLATION;  Surgeon: Constance Haw, MD;  Location: [place];  Service: Cardiovascular;  Laterality: N/A;   ABDOMINAL HYSTERECTOMY     ABLATION     BREAST BIOPSY Right 10/02/2017   BREAST EXCISIONAL BIOPSY Right 01/2002   BREAST LUMPECTOMY Right 2019   BREAST LUMPECTOMY WITH RADIOACTIVE SEED AND SENTINEL LYMPH NODE BIOPSY Right 11/26/2017   Procedure: RIGHT BREAST LUMPECTOMY WITH SENTINEL NODE MAPPING AND TARGETED NODE DISECTION ERAS PATHWAY;  Surgeon: Jovita Kussmaul, MD;  Location: [place];  Service: General;  Laterality: Right;   CESAREAN SECTION     CHOLECYSTECTOMY     KNEE ARTHROSCOPY     NECK SURGERY       Current Outpatient Medications  Medication Sig Dispense Refill   Cholecalciferol (DIALYVITE VITAMIN D 5000 PO) Take 5,000 Units by mouth daily.     ELDERBERRY PO Take by mouth.     ipratropium (ATROVENT) 0.03 % nasal spray Place 1 spray into the nose daily.     metoprolol succinate (TOPROL-XL) 25 MG 24  hr tablet TAKE 1/2 (ONE-HALF) TABLET BY MOUTH AT BEDTIME 45 tablet 2   omeprazole (PRILOSEC) 20 MG capsule Take 20 mg by mouth daily.     OVER THE COUNTER MEDICATION Calcium 600 mg one daily     rosuvastatin (CRESTOR) 5 MG tablet Take 5 mg by mouth daily.     Zinc 30 MG TABS Take 30 mg by mouth daily.      No current facility-administered medications for this visit.    Allergies:   Penicillins, Erythromycin, Sulfa antibiotics, and Sulfasalazine   Social History:  The patient  reports that she has never smoked. She has never used smokeless  tobacco. She reports that she does not drink alcohol and does not use drugs.   Family History:  The patient's family history includes Alzheimer's disease in her father; Breast cancer (age of onset: 4) in her sister; Breast cancer (age of onset: 66) in her mother; Parkinson's disease in her father; Stroke in her mother.   ROS:  Please see the history of present illness.   Otherwise, review of systems is positive for none.   All other systems are reviewed and negative.   PHYSICAL EXAM: VS:  There were no vitals taken for this visit. , BMI There is no height or weight on file to calculate BMI. GEN: Well nourished, well developed, in no acute distress  HEENT: normal  Neck: no JVD, carotid bruits, or masses Cardiac: RRR; no murmurs, rubs, or gallops,no edema  Respiratory:  clear to auscultation bilaterally, normal work of breathing GI: soft, nontender, nondistended, + BS MS: no deformity or atrophy  Skin: warm and dry Neuro:  Strength and sensation are intact Psych: euthymic mood, full affect  EKG:  EKG is not ordered today. Personal review of the ekg ordered 07/01/20 shows sinus rhythm, rate 64  Recent Labs: 12/04/2019: ALT 28; B Natriuretic Peptide 385.8; Magnesium 2.1; TSH 3.740 03/30/2020: BUN 17; Creatinine, Ser 0.83; Hemoglobin 14.0; Platelets 149; Potassium 3.8; Sodium 138    Lipid Panel  No results found for: CHOL, TRIG, HDL, CHOLHDL, VLDL, LDLCALC, LDLDIRECT   Wt Readings from Last 3 Encounters:  07/01/20 116 lb (52.6 kg)  05/10/20 117 lb (53.1 kg)  05/04/20 117 lb 4.8 oz (53.2 kg)      Other studies Reviewed: Additional studies/ records that were reviewed today include: TTE 06/21/16  Review of the above records today demonstrates:  - Left ventricle: The cavity size was normal. Wall thickness was    normal. Systolic function was normal. The estimated ejection    fraction was in the range of 60% to 65%. Wall motion was normal;    there were no regional wall motion  abnormalities. There was an    increased relative contribution of atrial contraction to    ventricular filling. Left ventricular diastolic function    parameters were normal.  - Aortic valve: Mildly calcified annulus. Probably trileaflet.  - Mitral valve: Mildly calcified annulus.  - Tricuspid valve: There was mild regurgitation.   Cardiac monitor 08/13/2019 personally reviewed Normal sinus rhythm avg HR of 71 bpm. max HR of 222 bpm   97 Supraventricular Tachycardia runs occurred, the run with the fastest interval lasting 4 beats with a max rate of 222 bpm, the longest lasting 1 min 5 secs with an avg rate of 164 bpm.  Supraventricular Tachycardia was detected within +/- 45 seconds of symptomatic patient event(s).    Junctional Rhythm was present.    Isolated SVEs were rare (<1.0%), SVE Couplets  were rare (<1.0%), and SVE Triplets were rare (<1.0%). Isolated VEs were rare (<1.0%), VE Couplets were rare (<1.0%), and no VE Triplets were present.  ASSESSMENT AND PLAN:  1.  Typical atrial flutter: Status post ablation on 12/26/2019.  No obvious recurrences.    2.  SVT: Found on cardiac monitor with rates between 160 and 220.  EP study during atrial fibrillation showed no inducible arrhythmias.  Currently on metoprolol.  She has been having some near syncopal episodes that have been associated with low blood pressures.  We Cassie Henkels stop metoprolol.  3.  Near syncope: Has had multiple episodes where her blood pressure goes down.  It is unclear whether or not she has having palpitations or arrhythmias prior to that.  She has had them in multiple different ways, when she is getting out of the shower, as well as when she is cooking.  We Khristopher Kapaun plan for Linq monitor implant, as her prior cardiac monitor did not show any episodes.  Current medicines are reviewed at length with the patient today.   The patient does not have concerns regarding her medicines.  The following changes were made today: Stop  metoprolol  Labs/ tests ordered today include:  No orders of the defined types were placed in this encounter.    Disposition:   FU with Joniyah Mallinger 46months  Signed, Ivanna Kocak Meredith Leeds, MD  08/06/2020 7:42 AM     [facility] [address] [place] [address] [phone] (office) [phone] (fax)

## 2020-08-30 ENCOUNTER — Other Ambulatory Visit: Payer: Self-pay | Admitting: Hematology and Oncology

## 2020-08-30 DIAGNOSIS — Z1231 Encounter for screening mammogram for malignant neoplasm of breast: Secondary | ICD-10-CM

## 2020-09-06 ENCOUNTER — Telehealth: Payer: Self-pay | Admitting: Cardiology

## 2020-09-06 NOTE — Telephone Encounter (Signed)
Spoke with patient and reviewed her concerns. She has had elevated blood pressures for the last week or so. She had been taking metoprolol 12.5 mg once daily and developed low blood pressures and felt faint. At that time blood pressures were 60/40 70/60 and it happened 4 times. She has always had low blood pressures. Medication was discontinued due to low blood pressures and near syncope events. After ablation her blood pressures went up. She also reports chest pressure which she can feel in her throat with frequent headaches. She states all of this comes and goes. Recently she has had fast heart rates with pressure at that time while driving. It last a good 5 minutes but has not had any repeat of these symptoms. She states Dr. Curt Bears was the one who stopped medication and that is the reason she called his office. She did state she is getting loop recorder in August and wishes she could get this sooner. Advised that I will send this over to our APP for review and recommendations and then I will be in touch once I hear back.

## 2020-09-06 NOTE — Progress Notes (Signed)
Cardiology Office Note  Date:  09/07/2020   ID:  Theresa Hale, DOB 12/07/1955, MRN [id number]  PCP:  Caryl Bis, MD   Chief Complaint  Patient presents with   Other    4-6 wk f/u Zio c/o elevated BP. Meds reviewed verbally with pt.    HPI:  [name] is a 65 year old woman with past medical history of Anxiety attacks Breast cancer Atrial flutter in 05/2016, 10/21 HTN CT coronary calcium score 262 Long history of paroxysmal tachycardia, symptomatic flutter and SVT Intolerant of rate and rhythm controlling medications secondary to hypotension Who presents for routine follow-up of her tachycardia, hx of atrial flutter  Last seen by myself in clinic November 2021 Followed by EP in [place] She has been followed for atrial flutter and SVT Underwent ablation December 26, 2019 Notes indicate episodes of Near syncope associated with low blood pressure, metoprolol held Early June with near syncope, low blood pressure Stopped metoprolol, no low blood pressure since then No further episodes of near syncope  Now has episodes of chest fullness /pressure Has some episodes at rest, sometimes with exertion Reports doing exercise, most of the time does fine with no chest symptoms occasionally when exercising will have symptoms  Some shortness of breath/fullness climbing up hills and stairs  EKG personally reviewed by myself on todays visit Shows normal sinus rhythm rate 80 bpm no significant ST-T wave changes  Other past medical history reviewed  ZIO monitor ordered by one of our providers Patient had a min HR of 49 bpm, max HR of 197 bpm, and avg HR of 67 bpm. Predominant underlying rhythm was Sinus Rhythm.   42 Supraventricular Tachycardia runs occurred, the run with the fastest interval lasting 6 beats with a max rate of 197 bpm, the longest lasting 11.3 secs with an avg rate of 127 bpm. Not patient triggered    Isolated SVEs were rare (<1.0%), SVE Couplets were rare  (<1.0%), and SVE Triplets were rare (<1.0%). Isolated VEs were rare (<1.0%, 123), VE Couplets were rare (<1.0%,  5), and VE Triplets were rare (<1.0%, 1).   Patient triggered events associated with sinus tachycardia  Developed atrial flutter with RVR, 1:45 Am, seen in the clinic December 04, 2019 Was sent to the emergency room, underwent cardioversion 6:45 pm  Started on anticoagulation She has follow-up with EP December 16, 2019  She is only on Eliquis 2.5 twice daily (started by the emergency room) Given normal renal function, age less than 8, should be on 5 twice daily   Echo 2018  Left ventricle: The cavity size was normal. Wall thickness was    normal. Systolic function was normal. The estimated ejection    fraction was in the range of 60% to 65%. Wall motion was normal;    there were no regional wall motion abnormalities. There was an    increased relative contribution of atrial contraction to    ventricular filling. Left ventricular diastolic function    parameters were normal.  - Aortic valve: Mildly calcified annulus. Probably trileaflet.  - Mitral valve: Mildly calcified annulus.  - Tricuspid valve: There was mild regurgitation.  03/29/2019 Near syncope Went down on the ground, etiology unclear   PMH:   has a past medical history of Atrial flutter ([place]), Breast cancer ([place]), Elevated coronary artery calcium score, GERD (gastroesophageal reflux disease), History of echocardiogram, Personal history of radiation therapy (02/2018), PONV (postoperative nausea and vomiting), and PSVT (paroxysmal supraventricular tachycardia) ([place]).  PSH:  Past Surgical History:  Procedure Laterality Date   A-FLUTTER ABLATION N/A 12/26/2019   Procedure: A-FLUTTER ABLATION;  Surgeon: Constance Haw, MD;  Location: [facility];  Service: Cardiovascular;  Laterality: N/A;   ABDOMINAL HYSTERECTOMY     ABLATION     BREAST BIOPSY Right 10/02/2017   BREAST EXCISIONAL BIOPSY Right  01/2002   BREAST LUMPECTOMY Right 2019   BREAST LUMPECTOMY WITH RADIOACTIVE SEED AND SENTINEL LYMPH NODE BIOPSY Right 11/26/2017   Procedure: RIGHT BREAST LUMPECTOMY WITH SENTINEL NODE MAPPING AND TARGETED NODE DISECTION ERAS PATHWAY;  Surgeon: Jovita Kussmaul, MD;  Location: [facility];  Service: General;  Laterality: Right;   CESAREAN SECTION     CHOLECYSTECTOMY     KNEE ARTHROSCOPY     NECK SURGERY      Current Outpatient Medications  Medication Sig Dispense Refill   Cholecalciferol (DIALYVITE VITAMIN D 5000 PO) Take 5,000 Units by mouth daily.     ELDERBERRY PO Take by mouth.     OVER THE COUNTER MEDICATION Calcium 600 mg one daily     rosuvastatin (CRESTOR) 5 MG tablet Take 5 mg by mouth daily.     Zinc 30 MG TABS Take 30 mg by mouth daily.      No current facility-administered medications for this visit.    Allergies:   Penicillins, Erythromycin, Sulfa antibiotics, and Sulfasalazine   Social History:  The patient  reports that she has never smoked. She has never used smokeless tobacco. She reports that she does not drink alcohol and does not use drugs.   Family History:   family history includes Alzheimer's disease in her father; Breast cancer (age of onset: 23) in her sister; Breast cancer (age of onset: 22) in her mother; Parkinson's disease in her father; Stroke in her mother.    Review of Systems: Review of Systems  Constitutional: Negative.   HENT: Negative.    Respiratory: Negative.    Cardiovascular:        Chest pressure  Gastrointestinal: Negative.   Musculoskeletal: Negative.   Psychiatric/Behavioral: Negative.    All other systems reviewed and are negative.  PHYSICAL EXAM: VS:  BP (!) 154/100 (BP Location: Left Arm, Patient Position: Sitting, Cuff Size: Normal)   Pulse 80   Ht '5\' 5"'$  (1.651 m)   Wt 116 lb 8 oz (52.8 kg)   SpO2 99%   BMI 19.39 kg/m  , BMI Body mass index is 19.39 kg/m. Constitutional:  oriented to person, place, and time.  No distress.  HENT:  Head: Grossly normal Eyes:  no discharge. No scleral icterus.  Neck: No JVD, no carotid bruits  Cardiovascular: Regular rate and rhythm, no murmurs appreciated Pulmonary/Chest: Clear to auscultation bilaterally, no wheezes or rails Abdominal: Soft.  no distension.  no tenderness.  Musculoskeletal: Normal range of motion Neurological:  normal muscle tone. Coordination normal. No atrophy Skin: Skin warm and dry Psychiatric: normal affect, pleasant   Recent Labs: 12/04/2019: ALT 28; B Natriuretic Peptide 385.8; Magnesium 2.1; TSH 3.740 03/30/2020: BUN 17; Creatinine, Ser 0.83; Hemoglobin 14.0; Platelets 149; Potassium 3.8; Sodium 138    Lipid Panel No results found for: CHOL, HDL, LDLCALC, TRIG    Wt Readings from Last 3 Encounters:  09/07/20 116 lb 8 oz (52.8 kg)  08/06/20 117 lb (53.1 kg)  07/01/20 116 lb (52.6 kg)     ASSESSMENT AND PLAN:  Problem List Items Addressed This Visit   None Visit Diagnoses     SVT (supraventricular  tachycardia) (HCC)    -  Primary   Paroxysmal atrial flutter (HCC)       Coronary artery calcification seen on CT scan       Mixed hyperlipidemia       Typical atrial flutter (HCC)       Hyperlipidemia LDL goal <70       Benign essential HTN          Atrial flutter Underwent ablation with EP Now off beta-blocker as she attributed this to low blood pressure and near syncope  SVT Noted on monitor, were not triggered with symptoms Patient patient triggered to sinus tachycardia  Chest pressure Atypical in nature, Unable to exclude GAD To rule out cardiac etiology cardiac CTA has been ordered Both she and her husband are in agreement Mildly elevated calcium score  Dizziness Symptoms resolved after holding metoprolol  Coronary calcification Coronary calcification, calcium score 260 continue low-dose Crestor Goal LDL less than 70, currently on low-dose Crestor  Headache Prior history    total encounter time  more than 35 minutes  Greater than 50% was spent in counseling and coordination of care with the patient    Signed, Esmond Plants, M.D., Ph.D. [place], [place]

## 2020-09-06 NOTE — Telephone Encounter (Signed)
Pt c/o BP issue: STAT if pt c/o blurred vision, one-sided weakness or slurred speech  1. What are your last 5 BP readings? Today 156/91/92, yesterday 152/73/76, 23rd 160/84/93, 149/84/79, 153/80/62  2. Are you having any other symptoms (ex. Dizziness, headache, blurred vision, passed out)? Headache, comes and goes, sometimes feels pressure in chest  3. What is your BP issue? States her BP has been up since she stopped metoprolol   Pt c/o of Chest Pain: STAT if CP now or developed within 24 hours  1. Are you having CP right now? no  2. Are you experiencing any other symptoms (ex. SOB, nausea, vomiting, sweating)? no  3. How long have you been experiencing CP? 2 or 3 weeks  4. Is your CP continuous or coming and going? Comes and goes  5. Have you taken Nitroglycerin? No   Patient states her BP has been high since she stopped the metoprolol. She says she has had a headache that comes and goes. She states she also has been having chest pressure that comes and goes. She says she is not having it now. She states on the 20th she has an episode where her heart raced and it took 5 minutes to stop. She states this does happen, but usually she does her "exercise things" and it goes away. She states it usually does not take this long to stop.  ?

## 2020-09-06 NOTE — Telephone Encounter (Signed)
Spoke with patient and reviewed provider recommendations. She was agreeable with this plan and had no further questions at this time. Reviewed signs and symptoms to monitor for that would require immediate evaluation in the ED. She verbalized understanding. After she called there was appointment cancelled for tomorrow and they are going to reach out to schedule her appt.     Theora Gianotti, NP  44 minutes ago (2:48 PM)    She'd be best served w/ an office visit to assess BPs/orthostatics.  Reluctant to resume metoprolol given h/o very low BPs.  If she is having chest pain, she may require ED or urgent care eval.

## 2020-09-07 ENCOUNTER — Other Ambulatory Visit: Payer: Self-pay

## 2020-09-07 ENCOUNTER — Encounter: Payer: Self-pay | Admitting: Cardiovascular Disease

## 2020-09-07 ENCOUNTER — Ambulatory Visit: Payer: Medicare PPO | Admitting: Cardiovascular Disease

## 2020-09-07 VITALS — BP 154/100 | HR 80 | Ht 65.0 in | Wt 116.5 lb

## 2020-09-07 DIAGNOSIS — R072 Precordial pain: Secondary | ICD-10-CM

## 2020-09-07 DIAGNOSIS — E782 Mixed hyperlipidemia: Secondary | ICD-10-CM

## 2020-09-07 DIAGNOSIS — E785 Hyperlipidemia, unspecified: Secondary | ICD-10-CM

## 2020-09-07 DIAGNOSIS — I4892 Unspecified atrial flutter: Secondary | ICD-10-CM

## 2020-09-07 DIAGNOSIS — I2510 Atherosclerotic heart disease of native coronary artery without angina pectoris: Secondary | ICD-10-CM | POA: Diagnosis not present

## 2020-09-07 DIAGNOSIS — I10 Essential (primary) hypertension: Secondary | ICD-10-CM

## 2020-09-07 DIAGNOSIS — I471 Supraventricular tachycardia: Secondary | ICD-10-CM

## 2020-09-07 DIAGNOSIS — I483 Typical atrial flutter: Secondary | ICD-10-CM

## 2020-09-07 DIAGNOSIS — Z01812 Encounter for preprocedural laboratory examination: Secondary | ICD-10-CM | POA: Diagnosis not present

## 2020-09-07 MED ORDER — METOPROLOL TARTRATE 100 MG PO TABS: ORAL_TABLET | ORAL | 0 refills | Status: DC

## 2020-09-07 NOTE — Patient Instructions (Addendum)
Medication Instructions: No changes  If you need a refill on your cardiac medications before your next appointment, please call your pharmacy.    Lab work: - Your physician recommends that you have lab work today: BMP   If you have labs (blood work) drawn today and your tests are completely normal, you will receive your results only by: [employer] (if you have MyChart) OR A paper copy in the mail If you have any lab test that is abnormal or we need to change your treatment, we will call you to review the results.   Testing/Procedures: - Your physician has requested that you have cardiac CT. Cardiac computed tomography (CT) is a painless test that uses an x-ray machine to take clear, detailed pictures of your heart.    Your cardiac CT will be scheduled at one of the below locations:   [facility] [address] [place], [place] [address] [phone]  [facility] [address] [place], [place] [address] [phone]  If scheduled at [facility], please arrive at the [facility] main entrance (entrance A) of [facility] 30 minutes prior to test start time. Proceed to the [facility] Radiology Department (first floor) to check-in and test prep.  If scheduled at [facility], please arrive 15 mins early for check-in and test prep.  Please follow these instructions carefully (unless otherwise directed):   On the Night Before the Test: Be sure to Drink plenty of water. Do not consume any caffeinated/decaffeinated beverages or chocolate 12 hours prior to your test. Do not take any antihistamines 12 hours prior to your test.   On the Day of the Test: Drink plenty of water until 1 hour prior to the test. Do not eat any food 4 hours prior to the test. You may take your regular medications prior to the test.  Take metoprolol (Lopressor) two hours prior to test (see  below) FEMALES- please wear underwire-free bra if available, avoid dresses & tight clothing  Metoprolol tartrate 100 mg tablet has been sent to your pharmacy. The day of your test, please follow the parameters below as to whether you will need to take a 1/2 tablet (50 mg) or 1 tablet (100 mg)   Resting HR 60-65 bpm and BP >110/50 mmHG  Metoprolol tartrate 50 mg PO 90-120 minutes prior to scan   Resting HR > 65 bpm and BP >110/50 mmHG  Metoprolol tartrate 100 mg PO 90-120 minutes prior to scan          After the Test: Drink plenty of water. After receiving IV contrast, you may experience a mild flushed feeling. This is normal. On occasion, you may experience a mild rash up to 24 hours after the test. This is not dangerous. If this occurs, you can take Benadryl 25 mg and increase your fluid intake. If you experience trouble breathing, this can be serious. If it is severe call [phone] IMMEDIATELY. If it is mild, please call our office.   Please allow 2-4 weeks for scheduling of routine cardiac CTs. Some insurance companies require a pre-authorization which may delay scheduling of this test.   For non-scheduling related questions, please contact the cardiac imaging nurse navigator should you have any questions/concerns: Marchia Bond, Cardiac Imaging Nurse Navigator Gordy Clement, Cardiac Imaging Nurse Navigator [facility] Direct Office Dial: [phone]   For scheduling needs, including cancellations and rescheduling, please call [place], [phone].  Follow-Up: At [facility], you and your health needs are our priority.  As part of our continuing mission to provide you with exceptional heart care, we have created designated Provider Care Teams.  These Care Teams include your primary Cardiologist (physician) and Advanced Practice Providers (APPs -  Physician Assistants and Nurse Practitioners) who all work together to provide you with the care you need,  when you need it.  You will need a follow up appointment with Dr. Curt Bears as scheduled  Providers on your designated Care Team:   Murray Hodgkins, NP Christell Faith, PA-C Marrianne Mood, PA-C Cadence Kathlen Mody, [place]  Any Other Special Instructions Will Be Listed Below (If Applicable).  COVID-19 Vaccine Information can be found at: ShippingScam.co.uk For questions related to vaccine distribution or appointments, please email [email or web address] or call [phone].    Cardiac CT Angiogram A cardiac CT angiogram is a procedure to look at the heart and the area around the heart. It may be done to help find the cause of chest pains or other symptoms of heart disease. During this procedure, a substance called contrast dye is injected into the blood vessels in the area to be checked. A large X-ray machine, called a CT scanner, then takes detailed pictures of the heart and the surrounding area. The procedure is also sometimes called a coronary CTangiogram, coronary artery scanning, or CTA. A cardiac CT angiogram allows the health care provider to see how well blood is flowing to and from the heart. The health care provider will be able to see if there are any problems, such as: Blockage or narrowing of the coronary arteries in the heart. Fluid around the heart. Signs of weakness or disease in the muscles, valves, and tissues of the heart. Tell a health care provider about: Any allergies you have. This is especially important if you have had a previous allergic reaction to contrast dye. All medicines you are taking, including vitamins, herbs, eye drops, creams, and over-the-counter medicines. Any blood disorders you have. Any surgeries you have had. Any medical conditions you have. Whether you are pregnant or may be pregnant. Any anxiety disorders, chronic pain, or other conditions you have that may increase your stress or prevent you from  lying still. What are the risks? Generally, this is a safe procedure. However, problems may occur, including: Bleeding. Infection. Allergic reactions to medicines or dyes. Damage to other structures or organs. Kidney damage from the contrast dye that is used. Increased risk of cancer from radiation exposure. This risk is low. Talk with your health care provider about: The risks and benefits of testing. How you can receive the lowest dose of radiation. What happens before the procedure? Wear comfortable clothing and remove any jewelry, glasses, dentures, and hearing aids. Follow instructions from your health care provider about eating and drinking. This may include: For 12 hours before the procedure -- avoid caffeine. This includes tea, coffee, soda, energy drinks, and diet pills. Drink plenty of water or other fluids that do not have caffeine in them. Being well hydrated can prevent complications. For 4-6 hours before the procedure -- stop eating and drinking. The contrast dye can cause nausea, but this is less likely if your stomach is empty. Ask your health care provider about changing or stopping your regular medicines. This is especially important if you are taking diabetes medicines, blood thinners, or medicines to treat problems with erections (erectile dysfunction). What happens during the procedure?  Hair on your chest may need to be removed so  that small sticky patches called electrodes can be placed on your chest. These will transmit information that helps to monitor your heart during the procedure. An IV will be inserted into one of your veins. You might be given a medicine to control your heart rate during the procedure. This will help to ensure that good images are obtained. You will be asked to lie on an exam table. This table will slide in and out of the CT machine during the procedure. Contrast dye will be injected into the IV. You might feel warm, or you may get a metallic  taste in your mouth. You will be given a medicine called nitroglycerin. This will relax or dilate the arteries in your heart. The table that you are lying on will move into the CT machine tunnel for the scan. The person running the machine will give you instructions while the scans are being done. You may be asked to: Keep your arms above your head. Hold your breath. Stay very still, even if the table is moving. When the scanning is complete, you will be moved out of the machine. The IV will be removed. The procedure may vary among health care providers and hospitals. What can I expect after the procedure? After your procedure, it is common to have: A metallic taste in your mouth from the contrast dye. A feeling of warmth. A headache from the nitroglycerin. Follow these instructions at home: Take over-the-counter and prescription medicines only as told by your health care provider. If you are told, drink enough fluid to keep your urine pale yellow. This will help to flush the contrast dye out of your body. Most people can return to their normal activities right after the procedure. Ask your health care provider what activities are safe for you. It is up to you to get the results of your procedure. Ask your health care provider, or the department that is doing the procedure, when your results will be ready. Keep all follow-up visits as told by your health care provider. This is important. Contact a health care provider if: You have any symptoms of allergy to the contrast dye. These include: Shortness of breath. Rash or hives. A racing heartbeat. Summary A cardiac CT angiogram is a procedure to look at the heart and the area around the heart. It may be done to help find the cause of chest pains or other symptoms of heart disease. During this procedure, a large X-ray machine, called a CT scanner, takes detailed pictures of the heart and the surrounding area after a contrast dye has been  injected into blood vessels in the area. Ask your health care provider about changing or stopping your regular medicines before the procedure. This is especially important if you are taking diabetes medicines, blood thinners, or medicines to treat erectile dysfunction. If you are told, drink enough fluid to keep your urine pale yellow. This will help to flush the contrast dye out of your body. This information is not intended to replace advice given to you by your health care provider. Make sure you discuss any questions you have with your healthcare provider. Document Revised: 09/25/2018 Document Reviewed: 09/25/2018 Elsevier Patient Education  [place].

## 2020-09-08 LAB — BASIC METABOLIC PANEL
BUN/Creatinine Ratio: 19 (ref 12–28)
BUN: 15 mg/dL (ref 8–27)
CO2: 24 mmol/L (ref 20–29)
Calcium: 9.5 mg/dL (ref 8.7–10.3)
Chloride: 99 mmol/L (ref 96–106)
Creatinine, Ser: 0.78 mg/dL (ref 0.57–1.00)
Glucose: 86 mg/dL (ref 65–99)
Potassium: 4.5 mmol/L (ref 3.5–5.2)
Sodium: 140 mmol/L (ref 134–144)
eGFR: 83 mL/min/{1.73_m2} (ref >59)

## 2020-09-08 NOTE — Addendum Note (Signed)
Addended by: Britt Bottom on: 09/08/2020 01:44 PM   Modules accepted: Orders

## 2020-09-09 ENCOUNTER — Ambulatory Visit
Admission: RE | Admit: 2020-09-09 | Discharge: 2020-09-09 | Disposition: A | Discharge status: Home / Self Care | Payer: Medicare PPO | Source: Ambulatory Visit | Attending: Hematology and Oncology | Admitting: Hematology and Oncology

## 2020-09-09 ENCOUNTER — Other Ambulatory Visit: Payer: Self-pay

## 2020-09-09 DIAGNOSIS — Z1231 Encounter for screening mammogram for malignant neoplasm of breast: Secondary | ICD-10-CM | POA: Insufficient documentation

## 2020-09-10 ENCOUNTER — Ambulatory Visit: Payer: Medicare PPO | Admitting: Cardiovascular Disease

## 2020-09-15 ENCOUNTER — Telehealth (HOSPITAL_COMMUNITY): Payer: Self-pay | Admitting: *Deleted

## 2020-09-15 NOTE — Telephone Encounter (Signed)
Called and left VM for pt, no to take Metoprolol if heart rate  is in the 60.  Instruction on not wear under wire bra or shirt with metallic embellishments.

## 2020-09-16 ENCOUNTER — Other Ambulatory Visit: Payer: Self-pay

## 2020-09-16 ENCOUNTER — Ambulatory Visit
Admission: RE | Admit: 2020-09-16 | Discharge: 2020-09-16 | Disposition: A | Discharge status: Home / Self Care | Payer: Medicare PPO | Source: Ambulatory Visit | Attending: Cardiovascular Disease | Admitting: Cardiovascular Disease

## 2020-09-16 ENCOUNTER — Other Ambulatory Visit: Payer: Self-pay | Admitting: Cardiovascular Disease

## 2020-09-16 DIAGNOSIS — R931 Abnormal findings on diagnostic imaging of heart and coronary circulation: Secondary | ICD-10-CM

## 2020-09-16 DIAGNOSIS — R072 Precordial pain: Secondary | ICD-10-CM | POA: Diagnosis not present

## 2020-09-16 DIAGNOSIS — I2510 Atherosclerotic heart disease of native coronary artery without angina pectoris: Secondary | ICD-10-CM | POA: Diagnosis not present

## 2020-09-16 MED ORDER — NITROGLYCERIN 0.4 MG SL SUBL
0.8000 mg | SUBLINGUAL_TABLET | Freq: Once | SUBLINGUAL | Status: AC
  Administered 2020-09-16: 0.8 mg via SUBLINGUAL

## 2020-09-16 MED ORDER — METOPROLOL TARTRATE 5 MG/5ML IV SOLN
5.0000 mg | Freq: Once | INTRAVENOUS | Status: AC
  Administered 2020-09-16: 5 mg via INTRAVENOUS

## 2020-09-16 MED ORDER — IOHEXOL 350 MG/ML SOLN
75.0000 mL | Freq: Once | INTRAVENOUS | Status: AC | PRN
  Administered 2020-09-16: 75 mL via INTRAVENOUS

## 2020-09-16 NOTE — Progress Notes (Signed)
Patient tolerated CT well. Drank water after. Vital signs stable encourage to drink water throughout day.Reasons explained and verbalized understanding. Ambulated steady gait.  

## 2020-09-17 ENCOUNTER — Telehealth: Payer: Self-pay | Admitting: Cardiovascular Disease

## 2020-09-17 DIAGNOSIS — I2510 Atherosclerotic heart disease of native coronary artery without angina pectoris: Secondary | ICD-10-CM | POA: Diagnosis not present

## 2020-09-17 NOTE — Telephone Encounter (Signed)
Patient requesting to discuss results. Please assist

## 2020-09-17 NOTE — Telephone Encounter (Signed)
Was able to reach back out to [name], advised Dr. Rockey Situ as not read her CT results at this time, yes they were posted to her MyChart for review after the radiologist had read her report.   Preliminary results reviewed by nurse. Will call back when Dr. Rockey Situ reviews and has comments and/or recommendations. Pt verbalized understanding, nothing further at this time.

## 2020-09-20 NOTE — Telephone Encounter (Signed)
Patient states received Mychart from [facility] states needs to discuss a possible cath. Please assist

## 2020-09-21 NOTE — Telephone Encounter (Signed)
Able to reach pt regarding her recent Cardiac CTA results, Dr. Rockey Situ had a chance to review his results and advised    Minna Merritts, MD  09/20/2020  1:27 PM EDT      Significant blockage noted on cardiac CTA Needs to come in for discussion, possibly set up for cardiac cath    Mrs. Ellinger reports has seen results on her MyChart, has been expecting a call, educated pt on what a heart was and what it in detailed, however needs to come in for an appt to discuss results further and potential need for cath by Dr. Rockey Situ where he can review risk and benefits. Pt verbalized understanding.  Pt has out-of-town trip plan for next week, advised okay to keep, no need to cancel as Dr. Rockey Situ did not advised as urgent, trip could help to unwind and spend time with family. Pt thankful, is okay with not canceling since heart cath was educated to pt for better understanding. Continue current meds as order.  Pt schedule for 8/22 at 3:20 pm with Dr. Rockey Situ. At this time, all questions were address and no additional concerns at this time. Agreeable to plan, will call back for anything further.

## 2020-10-03 NOTE — H&P (View-Only) (Signed)
Cardiology Office Note  Date:  10/04/2020   ID:  Nadirah, Finnen [birth year], MRN [id number]  PCP:  Caryl Bis, MD   Chief Complaint  Patient presents with   Follow up Cardiac CTA    Patient c/o frequently headaches, shortness of breath, tiredness and chest pain/pressure. Medications reviewed by the patient verbally.     HPI:  Theresa Hale is a 65 year old woman with past medical history of Anxiety attacks Breast cancer Atrial flutter in 05/2016, 10/21 HTN CT coronary calcium score 262 Long history of paroxysmal tachycardia, symptomatic flutter and SVT Intolerant of rate and rhythm controlling medications secondary to hypotension Who presents for routine follow-up of her tachycardia, hx of atrial flutter, angina  Recently seen September 07, 2020 in the office Reported having some episodes of chest fullness/pressure concerning for angina  Cardiac CTA ordered Documenting severe proximal to mid LAD stenosis, FFR of 0.7  She continues to have chest tightness, shortness of breath symptoms Interested in cardiac catheterization  Other records reviewed with her  Followed by EP in [place] She has been followed for atrial flutter and SVT Underwent ablation December 26, 2019 episodes of Near syncope associated with low blood pressure, metoprolol held Early June with near syncope, low blood pressure Stopped metoprolol, no low blood pressure since then  EKG personally reviewed by myself on todays visit Shows normal sinus rhythm rate 62 bpm no significant ST-T wave changes  Other past medical history reviewed  ZIO monitor ordered by one of our providers Patient had a min HR of 49 bpm, max HR of 197 bpm, and avg HR of 67 bpm. Predominant underlying rhythm was Sinus Rhythm.   42 Supraventricular Tachycardia runs occurred, the run with the fastest interval lasting 6 beats with a max rate of 197 bpm, the longest lasting 11.3 secs with an avg rate of 127 bpm. Not patient  triggered    Isolated SVEs were rare (<1.0%), SVE Couplets were rare (<1.0%), and SVE Triplets were rare (<1.0%). Isolated VEs were rare (<1.0%, 123), VE Couplets were rare (<1.0%,  5), and VE Triplets were rare (<1.0%, 1).   Patient triggered events associated with sinus tachycardia  Developed atrial flutter with RVR, 1:45 Am, seen in the clinic December 04, 2019 Was sent to the emergency room, underwent cardioversion 6:45 pm  Started on anticoagulation She has follow-up with EP December 16, 2019  She is only on Eliquis 2.5 twice daily (started by the emergency room) Given normal renal function, age less than 9, should be on 5 twice daily   Echo 2018  Left ventricle: The cavity size was normal. Wall thickness was    normal. Systolic function was normal. The estimated ejection    fraction was in the range of 60% to 65%. Wall motion was normal;    there were no regional wall motion abnormalities. There was an    increased relative contribution of atrial contraction to    ventricular filling. Left ventricular diastolic function    parameters were normal.  - Aortic valve: Mildly calcified annulus. Probably trileaflet.  - Mitral valve: Mildly calcified annulus.  - Tricuspid valve: There was mild regurgitation.  03/29/2019 Near syncope Went down on the ground, etiology unclear   PMH:   has a past medical history of Atrial flutter ([place]), Breast cancer ([place]), Elevated coronary artery calcium score, GERD (gastroesophageal reflux disease), History of echocardiogram, Personal history of radiation therapy (02/2018), PONV (postoperative nausea and vomiting), and PSVT (paroxysmal supraventricular tachycardia) ([place]).  PSH:    Past Surgical History:  Procedure Laterality Date   A-FLUTTER ABLATION N/A 12/26/2019   Procedure: A-FLUTTER ABLATION;  Surgeon: Constance Haw, MD;  Location: [facility];  Service: Cardiovascular;  Laterality: N/A;   ABDOMINAL HYSTERECTOMY     ABLATION      BREAST BIOPSY Right 10/02/2017   BREAST EXCISIONAL BIOPSY Right 01/2002   BREAST LUMPECTOMY Right 2019   BREAST LUMPECTOMY WITH RADIOACTIVE SEED AND SENTINEL LYMPH NODE BIOPSY Right 11/26/2017   Procedure: RIGHT BREAST LUMPECTOMY WITH SENTINEL NODE MAPPING AND TARGETED NODE DISECTION ERAS PATHWAY;  Surgeon: Jovita Kussmaul, MD;  Location: [facility];  Service: General;  Laterality: Right;   CESAREAN SECTION     CHOLECYSTECTOMY     KNEE ARTHROSCOPY     NECK SURGERY      Current Outpatient Medications  Medication Sig Dispense Refill   Cholecalciferol (DIALYVITE VITAMIN D 5000 PO) Take 5,000 Units by mouth daily.     ELDERBERRY PO Take by mouth.     OVER THE COUNTER MEDICATION Calcium 600 mg one daily     rosuvastatin (CRESTOR) 5 MG tablet Take 5 mg by mouth daily.     No current facility-administered medications for this visit.    Allergies:   Penicillins, Erythromycin, Sulfa antibiotics, and Sulfasalazine   Social History:  The patient  reports that she has never smoked. She has never used smokeless tobacco. She reports that she does not drink alcohol and does not use drugs.   Family History:   family history includes Alzheimer's disease in her father; Breast cancer (age of onset: 55) in her sister; Breast cancer (age of onset: 72) in her mother; Parkinson's disease in her father; Stroke in her mother.    Review of Systems: Review of Systems  Constitutional: Negative.   HENT: Negative.    Respiratory:  Positive for shortness of breath.   Cardiovascular:  Positive for chest pain.  Gastrointestinal: Negative.   Musculoskeletal: Negative.   Psychiatric/Behavioral: Negative.    All other systems reviewed and are negative.  PHYSICAL EXAM: VS:  BP (!) 142/82 (BP Location: Left Arm, Patient Position: Sitting, Cuff Size: Normal)   Pulse 62   Ht '5\' 5"'$  (1.651 m)   Wt 118 lb 6 oz (53.7 kg)   SpO2 98%   BMI 19.70 kg/m  , BMI Body mass index is 19.7  kg/m. Constitutional:  oriented to person, place, and time. No distress.  HENT:  Head: Grossly normal Eyes:  no discharge. No scleral icterus.  Neck: No JVD, no carotid bruits  Cardiovascular: Regular rate and rhythm, no murmurs appreciated Pulmonary/Chest: Clear to auscultation bilaterally, no wheezes or rails Abdominal: Soft.  no distension.  no tenderness.  Musculoskeletal: Normal range of motion Neurological:  normal muscle tone. Coordination normal. No atrophy Skin: Skin warm and dry Psychiatric: normal affect, pleasant   Recent Labs: 12/04/2019: ALT 28; B Natriuretic Peptide 385.8; Magnesium 2.1; TSH 3.740 03/30/2020: Hemoglobin 14.0; Platelets 149 09/07/2020: BUN 15; Creatinine, Ser 0.78; Potassium 4.5; Sodium 140    Lipid Panel No results found for: CHOL, HDL, LDLCALC, TRIG    Wt Readings from Last 3 Encounters:  10/04/20 118 lb 6 oz (53.7 kg)  09/07/20 116 lb 8 oz (52.8 kg)  08/06/20 117 lb (53.1 kg)     ASSESSMENT AND PLAN:  Problem List Items Addressed This Visit   None Visit Diagnoses     SVT (supraventricular tachycardia) ([place])    -  Primary  Paroxysmal atrial flutter (HCC)       Coronary artery calcification seen on CT scan       Mixed hyperlipidemia       Benign essential HTN         Atrial flutter Underwent ablation with EP Off beta-blocker secondary to hypotension  SVT Noted on monitor Difficulty tolerating beta-blocker secondary to low blood pressure Followed by EP  Chest pressure Cardiac CTA with severe stenosis of LAD Discussed various treatment options including cardiac catheterization I have reviewed the risks, indications, and alternatives to cardiac catheterization, possible angioplasty, and stenting with the patient. Risks include but are not limited to bleeding, infection, vascular injury, stroke, myocardial infection, arrhythmia, kidney injury, radiation-related injury in the case of prolonged fluoroscopy use, emergency cardiac  surgery, and death. The patient understands the risks of serious complication is 1-2 in 123XX123 with diagnostic cardiac cath and 1-2% or less with angioplasty/stenting.  -She is willing to proceed, we will schedule cardiac catheterization this Friday October 08 2020  Dizziness Improved by holding metoprolol  Headache Prior history Follow-up with primary care    total encounter time more than 35 minutes  Greater than 50% was spent in counseling and coordination of care with the patient    Signed, Esmond Plants, M.D., Ph.D. [place], [place]

## 2020-10-03 NOTE — Progress Notes (Signed)
Cardiology Office Note  Date:  10/04/2020   ID:  Brizza, Zenisek September 13, 1955, MRN [id number]  PCP:  Caryl Bis, MD   Chief Complaint  Patient presents with   Follow up Cardiac CTA    Patient c/o frequently headaches, shortness of breath, tiredness and chest pain/pressure. Medications reviewed by the patient verbally.     HPI:  [name] is a 65 year old woman with past medical history of Anxiety attacks Breast cancer Atrial flutter in 05/2016, 10/21 HTN CT coronary calcium score 262 Long history of paroxysmal tachycardia, symptomatic flutter and SVT Intolerant of rate and rhythm controlling medications secondary to hypotension Who presents for routine follow-up of her tachycardia, hx of atrial flutter, angina  Recently seen September 07, 2020 in the office Reported having some episodes of chest fullness/pressure concerning for angina  Cardiac CTA ordered Documenting severe proximal to mid LAD stenosis, FFR of 0.7  She continues to have chest tightness, shortness of breath symptoms Interested in cardiac catheterization  Other records reviewed with her  Followed by EP in [place] She has been followed for atrial flutter and SVT Underwent ablation December 26, 2019 episodes of Near syncope associated with low blood pressure, metoprolol held Early June with near syncope, low blood pressure Stopped metoprolol, no low blood pressure since then  EKG personally reviewed by myself on todays visit Shows normal sinus rhythm rate 62 bpm no significant ST-T wave changes  Other past medical history reviewed  ZIO monitor ordered by one of our providers Patient had a min HR of 49 bpm, max HR of 197 bpm, and avg HR of 67 bpm. Predominant underlying rhythm was Sinus Rhythm.   42 Supraventricular Tachycardia runs occurred, the run with the fastest interval lasting 6 beats with a max rate of 197 bpm, the longest lasting 11.3 secs with an avg rate of 127 bpm. Not patient  triggered    Isolated SVEs were rare (<1.0%), SVE Couplets were rare (<1.0%), and SVE Triplets were rare (<1.0%). Isolated VEs were rare (<1.0%, 123), VE Couplets were rare (<1.0%,  5), and VE Triplets were rare (<1.0%, 1).   Patient triggered events associated with sinus tachycardia  Developed atrial flutter with RVR, 1:45 Am, seen in the clinic December 04, 2019 Was sent to the emergency room, underwent cardioversion 6:45 pm  Started on anticoagulation She has follow-up with EP December 16, 2019  She is only on Eliquis 2.5 twice daily (started by the emergency room) Given normal renal function, age less than 26, should be on 5 twice daily   Echo 2018  Left ventricle: The cavity size was normal. Wall thickness was    normal. Systolic function was normal. The estimated ejection    fraction was in the range of 60% to 65%. Wall motion was normal;    there were no regional wall motion abnormalities. There was an    increased relative contribution of atrial contraction to    ventricular filling. Left ventricular diastolic function    parameters were normal.  - Aortic valve: Mildly calcified annulus. Probably trileaflet.  - Mitral valve: Mildly calcified annulus.  - Tricuspid valve: There was mild regurgitation.  03/29/2019 Near syncope Went down on the ground, etiology unclear   PMH:   has a past medical history of Atrial flutter ([place]), Breast cancer ([place]), Elevated coronary artery calcium score, GERD (gastroesophageal reflux disease), History of echocardiogram, Personal history of radiation therapy (02/2018), PONV (postoperative nausea and vomiting), and PSVT (paroxysmal supraventricular tachycardia) ([place]).  PSH:    Past Surgical History:  Procedure Laterality Date   A-FLUTTER ABLATION N/A 12/26/2019   Procedure: A-FLUTTER ABLATION;  Surgeon: Constance Haw, MD;  Location: [facility];  Service: Cardiovascular;  Laterality: N/A;   ABDOMINAL HYSTERECTOMY     ABLATION      BREAST BIOPSY Right 10/02/2017   BREAST EXCISIONAL BIOPSY Right 01/2002   BREAST LUMPECTOMY Right 2019   BREAST LUMPECTOMY WITH RADIOACTIVE SEED AND SENTINEL LYMPH NODE BIOPSY Right 11/26/2017   Procedure: RIGHT BREAST LUMPECTOMY WITH SENTINEL NODE MAPPING AND TARGETED NODE DISECTION ERAS PATHWAY;  Surgeon: Jovita Kussmaul, MD;  Location: [facility];  Service: General;  Laterality: Right;   CESAREAN SECTION     CHOLECYSTECTOMY     KNEE ARTHROSCOPY     NECK SURGERY      Current Outpatient Medications  Medication Sig Dispense Refill   Cholecalciferol (DIALYVITE VITAMIN D 5000 PO) Take 5,000 Units by mouth daily.     ELDERBERRY PO Take by mouth.     OVER THE COUNTER MEDICATION Calcium 600 mg one daily     rosuvastatin (CRESTOR) 5 MG tablet Take 5 mg by mouth daily.     No current facility-administered medications for this visit.    Allergies:   Penicillins, Erythromycin, Sulfa antibiotics, and Sulfasalazine   Social History:  The patient  reports that she has never smoked. She has never used smokeless tobacco. She reports that she does not drink alcohol and does not use drugs.   Family History:   family history includes Alzheimer's disease in her father; Breast cancer (age of onset: 2) in her sister; Breast cancer (age of onset: 9) in her mother; Parkinson's disease in her father; Stroke in her mother.    Review of Systems: Review of Systems  Constitutional: Negative.   HENT: Negative.    Respiratory:  Positive for shortness of breath.   Cardiovascular:  Positive for chest pain.  Gastrointestinal: Negative.   Musculoskeletal: Negative.   Psychiatric/Behavioral: Negative.    All other systems reviewed and are negative.  PHYSICAL EXAM: VS:  BP (!) 142/82 (BP Location: Left Arm, Patient Position: Sitting, Cuff Size: Normal)   Pulse 62   Ht '5\' 5"'$  (1.651 m)   Wt 118 lb 6 oz (53.7 kg)   SpO2 98%   BMI 19.70 kg/m  , BMI Body mass index is 19.7  kg/m. Constitutional:  oriented to person, place, and time. No distress.  HENT:  Head: Grossly normal Eyes:  no discharge. No scleral icterus.  Neck: No JVD, no carotid bruits  Cardiovascular: Regular rate and rhythm, no murmurs appreciated Pulmonary/Chest: Clear to auscultation bilaterally, no wheezes or rails Abdominal: Soft.  no distension.  no tenderness.  Musculoskeletal: Normal range of motion Neurological:  normal muscle tone. Coordination normal. No atrophy Skin: Skin warm and dry Psychiatric: normal affect, pleasant   Recent Labs: 12/04/2019: ALT 28; B Natriuretic Peptide 385.8; Magnesium 2.1; TSH 3.740 03/30/2020: Hemoglobin 14.0; Platelets 149 09/07/2020: BUN 15; Creatinine, Ser 0.78; Potassium 4.5; Sodium 140    Lipid Panel No results found for: CHOL, HDL, LDLCALC, TRIG    Wt Readings from Last 3 Encounters:  10/04/20 118 lb 6 oz (53.7 kg)  09/07/20 116 lb 8 oz (52.8 kg)  08/06/20 117 lb (53.1 kg)     ASSESSMENT AND PLAN:  Problem List Items Addressed This Visit   None Visit Diagnoses     SVT (supraventricular tachycardia) ([place])    -  Primary  Paroxysmal atrial flutter (H[facility])       Coronary artery calcification seen on CT scan       Mixed hyperlipidemia       Benign essential HTN         Atrial flutter Underwent ablation with EP Off beta-blocker secondary to hypotension  SVT Noted on monitor Difficulty tolerating beta-blocker secondary to low blood pressure Followed by EP  Chest pressure Cardiac CTA with severe stenosis of LAD Discussed various treatment options including cardiac catheterization I have reviewed the risks, indications, and alternatives to cardiac catheterization, possible angioplasty, and stenting with the patient. Risks include but are not limited to bleeding, infection, vascular injury, stroke, myocardial infection, arrhythmia, kidney injury, radiation-related injury in the case of prolonged fluoroscopy use, emergency cardiac  surgery, and death. The patient understands the risks of serious complication is 1-2 in 123XX123 with diagnostic cardiac cath and 1-2% or less with angioplasty/stenting.  -She is willing to proceed, we will schedule cardiac catheterization this Friday October 08 2020  Dizziness Improved by holding metoprolol  Headache Prior history Follow-up with primary care    total encounter time more than 35 minutes  Greater than 50% was spent in counseling and coordination of care with the patient    Signed, Esmond Plants, M.D., Ph.D. [place], [place]

## 2020-10-03 NOTE — H&P (View-Only) (Signed)
Cardiology Office Note  Date:  10/04/2020   ID:  Theresa, Hale [birth year], MRN [id number]  PCP:  Caryl Bis, MD   Chief Complaint  Patient presents with   Follow up Cardiac CTA    Patient c/o frequently headaches, shortness of breath, tiredness and chest pain/pressure. Medications reviewed by the patient verbally.     HPI:  [name] is a 65 year old woman with past medical history of Anxiety attacks Breast cancer Atrial flutter in 05/2016, 10/21 HTN CT coronary calcium score 262 Long history of paroxysmal tachycardia, symptomatic flutter and SVT Intolerant of rate and rhythm controlling medications secondary to hypotension Who presents for routine follow-up of her tachycardia, hx of atrial flutter, angina  Recently seen September 07, 2020 in the office Reported having some episodes of chest fullness/pressure concerning for angina  Cardiac CTA ordered Documenting severe proximal to mid LAD stenosis, FFR of 0.7  She continues to have chest tightness, shortness of breath symptoms Interested in cardiac catheterization  Other records reviewed with her  Followed by EP in [place] She has been followed for atrial flutter and SVT Underwent ablation December 26, 2019 episodes of Near syncope associated with low blood pressure, metoprolol held Early June with near syncope, low blood pressure Stopped metoprolol, no low blood pressure since then  EKG personally reviewed by myself on todays visit Shows normal sinus rhythm rate 62 bpm no significant ST-T wave changes  Other past medical history reviewed  ZIO monitor ordered by one of our providers Patient had a min HR of 49 bpm, max HR of 197 bpm, and avg HR of 67 bpm. Predominant underlying rhythm was Sinus Rhythm.   42 Supraventricular Tachycardia runs occurred, the run with the fastest interval lasting 6 beats with a max rate of 197 bpm, the longest lasting 11.3 secs with an avg rate of 127 bpm. Not patient  triggered    Isolated SVEs were rare (<1.0%), SVE Couplets were rare (<1.0%), and SVE Triplets were rare (<1.0%). Isolated VEs were rare (<1.0%, 123), VE Couplets were rare (<1.0%,  5), and VE Triplets were rare (<1.0%, 1).   Patient triggered events associated with sinus tachycardia  Developed atrial flutter with RVR, 1:45 Am, seen in the clinic December 04, 2019 Was sent to the emergency room, underwent cardioversion 6:45 pm  Started on anticoagulation She has follow-up with EP December 16, 2019  She is only on Eliquis 2.5 twice daily (started by the emergency room) Given normal renal function, age less than 42, should be on 5 twice daily   Echo 2018  Left ventricle: The cavity size was normal. Wall thickness was    normal. Systolic function was normal. The estimated ejection    fraction was in the range of 60% to 65%. Wall motion was normal;    there were no regional wall motion abnormalities. There was an    increased relative contribution of atrial contraction to    ventricular filling. Left ventricular diastolic function    parameters were normal.  - Aortic valve: Mildly calcified annulus. Probably trileaflet.  - Mitral valve: Mildly calcified annulus.  - Tricuspid valve: There was mild regurgitation.  03/29/2019 Near syncope Went down on the ground, etiology unclear   PMH:   has a past medical history of Atrial flutter ([place]), Breast cancer ([place]), Elevated coronary artery calcium score, GERD (gastroesophageal reflux disease), History of echocardiogram, Personal history of radiation therapy (02/2018), PONV (postoperative nausea and vomiting), and PSVT (paroxysmal supraventricular tachycardia) ([place]).  PSH:    Past Surgical History:  Procedure Laterality Date   A-FLUTTER ABLATION N/A 12/26/2019   Procedure: A-FLUTTER ABLATION;  Surgeon: Constance Haw, MD;  Location: [facility];  Service: Cardiovascular;  Laterality: N/A;   ABDOMINAL HYSTERECTOMY     ABLATION      BREAST BIOPSY Right 10/02/2017   BREAST EXCISIONAL BIOPSY Right 01/2002   BREAST LUMPECTOMY Right 2019   BREAST LUMPECTOMY WITH RADIOACTIVE SEED AND SENTINEL LYMPH NODE BIOPSY Right 11/26/2017   Procedure: RIGHT BREAST LUMPECTOMY WITH SENTINEL NODE MAPPING AND TARGETED NODE DISECTION ERAS PATHWAY;  Surgeon: Jovita Kussmaul, MD;  Location: [facility];  Service: General;  Laterality: Right;   CESAREAN SECTION     CHOLECYSTECTOMY     KNEE ARTHROSCOPY     NECK SURGERY      Current Outpatient Medications  Medication Sig Dispense Refill   Cholecalciferol (DIALYVITE VITAMIN D 5000 PO) Take 5,000 Units by mouth daily.     ELDERBERRY PO Take by mouth.     OVER THE COUNTER MEDICATION Calcium 600 mg one daily     rosuvastatin (CRESTOR) 5 MG tablet Take 5 mg by mouth daily.     No current facility-administered medications for this visit.    Allergies:   Penicillins, Erythromycin, Sulfa antibiotics, and Sulfasalazine   Social History:  The patient  reports that she has never smoked. She has never used smokeless tobacco. She reports that she does not drink alcohol and does not use drugs.   Family History:   family history includes Alzheimer's disease in her father; Breast cancer (age of onset: 21) in her sister; Breast cancer (age of onset: 77) in her mother; Parkinson's disease in her father; Stroke in her mother.    Review of Systems: Review of Systems  Constitutional: Negative.   HENT: Negative.    Respiratory:  Positive for shortness of breath.   Cardiovascular:  Positive for chest pain.  Gastrointestinal: Negative.   Musculoskeletal: Negative.   Psychiatric/Behavioral: Negative.    All other systems reviewed and are negative.  PHYSICAL EXAM: VS:  BP (!) 142/82 (BP Location: Left Arm, Patient Position: Sitting, Cuff Size: Normal)   Pulse 62   Ht '5\' 5"'$  (1.651 m)   Wt 118 lb 6 oz (53.7 kg)   SpO2 98%   BMI 19.70 kg/m  , BMI Body mass index is 19.7  kg/m. Constitutional:  oriented to person, place, and time. No distress.  HENT:  Head: Grossly normal Eyes:  no discharge. No scleral icterus.  Neck: No JVD, no carotid bruits  Cardiovascular: Regular rate and rhythm, no murmurs appreciated Pulmonary/Chest: Clear to auscultation bilaterally, no wheezes or rails Abdominal: Soft.  no distension.  no tenderness.  Musculoskeletal: Normal range of motion Neurological:  normal muscle tone. Coordination normal. No atrophy Skin: Skin warm and dry Psychiatric: normal affect, pleasant   Recent Labs: 12/04/2019: ALT 28; B Natriuretic Peptide 385.8; Magnesium 2.1; TSH 3.740 03/30/2020: Hemoglobin 14.0; Platelets 149 09/07/2020: BUN 15; Creatinine, Ser 0.78; Potassium 4.5; Sodium 140    Lipid Panel No results found for: CHOL, HDL, LDLCALC, TRIG    Wt Readings from Last 3 Encounters:  10/04/20 118 lb 6 oz (53.7 kg)  09/07/20 116 lb 8 oz (52.8 kg)  08/06/20 117 lb (53.1 kg)     ASSESSMENT AND PLAN:  Problem List Items Addressed This Visit   None Visit Diagnoses     SVT (supraventricular tachycardia) ([place])    -  Primary  Paroxysmal atrial flutter (HCC)       Coronary artery calcification seen on CT scan       Mixed hyperlipidemia       Benign essential HTN         Atrial flutter Underwent ablation with EP Off beta-blocker secondary to hypotension  SVT Noted on monitor Difficulty tolerating beta-blocker secondary to low blood pressure Followed by EP  Chest pressure Cardiac CTA with severe stenosis of LAD Discussed various treatment options including cardiac catheterization I have reviewed the risks, indications, and alternatives to cardiac catheterization, possible angioplasty, and stenting with the patient. Risks include but are not limited to bleeding, infection, vascular injury, stroke, myocardial infection, arrhythmia, kidney injury, radiation-related injury in the case of prolonged fluoroscopy use, emergency cardiac  surgery, and death. The patient understands the risks of serious complication is 1-2 in [id number] with diagnostic cardiac cath and 1-2% or less with angioplasty/stenting.  -She is willing to proceed, we will schedule cardiac catheterization this Friday October 08 2020  Dizziness Improved by holding metoprolol  Headache Prior history Follow-up with primary care    total encounter time more than 35 minutes  Greater than 50% was spent in counseling and coordination of care with the patient    Signed, Esmond Plants, M.D., Ph.D. [place], [place]

## 2020-10-04 ENCOUNTER — Other Ambulatory Visit: Payer: Self-pay | Admitting: Cardiovascular Disease

## 2020-10-04 ENCOUNTER — Ambulatory Visit: Payer: Medicare PPO | Admitting: Cardiology

## 2020-10-04 ENCOUNTER — Ambulatory Visit: Payer: Medicare PPO | Admitting: Cardiovascular Disease

## 2020-10-04 ENCOUNTER — Other Ambulatory Visit
Admission: RE | Admit: 2020-10-04 | Discharge: 2020-10-04 | Disposition: A | Discharge status: Home / Self Care | Payer: Medicare PPO | Source: Ambulatory Visit | Attending: Cardiovascular Disease | Admitting: Cardiovascular Disease

## 2020-10-04 ENCOUNTER — Other Ambulatory Visit: Payer: Self-pay

## 2020-10-04 ENCOUNTER — Encounter: Payer: Self-pay | Admitting: Cardiovascular Disease

## 2020-10-04 VITALS — BP 142/82 | HR 62 | Ht 65.0 in | Wt 118.4 lb

## 2020-10-04 DIAGNOSIS — Z01812 Encounter for preprocedural laboratory examination: Secondary | ICD-10-CM | POA: Insufficient documentation

## 2020-10-04 DIAGNOSIS — Z01818 Encounter for other preprocedural examination: Secondary | ICD-10-CM | POA: Diagnosis not present

## 2020-10-04 DIAGNOSIS — I2510 Atherosclerotic heart disease of native coronary artery without angina pectoris: Secondary | ICD-10-CM | POA: Diagnosis not present

## 2020-10-04 DIAGNOSIS — I471 Supraventricular tachycardia, unspecified: Secondary | ICD-10-CM

## 2020-10-04 DIAGNOSIS — I10 Essential (primary) hypertension: Secondary | ICD-10-CM

## 2020-10-04 DIAGNOSIS — I200 Unstable angina: Secondary | ICD-10-CM

## 2020-10-04 DIAGNOSIS — I4892 Unspecified atrial flutter: Secondary | ICD-10-CM | POA: Diagnosis not present

## 2020-10-04 DIAGNOSIS — E782 Mixed hyperlipidemia: Secondary | ICD-10-CM | POA: Diagnosis not present

## 2020-10-04 LAB — CBC WITH DIFFERENTIAL/PLATELET
Abs Immature Granulocytes: 0.02 10*3/uL (ref 0.00–0.07)
Basophils Absolute: 0 10*3/uL (ref 0.0–0.1)
Basophils Relative: 1 %
Eosinophils Absolute: 0.1 10*3/uL (ref 0.0–0.5)
Eosinophils Relative: 1 %
HCT: 43.3 % (ref 36.0–46.0)
Hemoglobin: 15.4 g/dL — ABNORMAL HIGH (ref 12.0–15.0)
Immature Granulocytes: 0 %
Lymphocytes Relative: 24 %
Lymphs Abs: 1.6 10*3/uL (ref 0.7–4.0)
MCH: 30.4 pg (ref 26.0–34.0)
MCHC: 35.6 g/dL (ref 30.0–36.0)
MCV: 85.6 fL (ref 80.0–100.0)
Monocytes Absolute: 0.5 10*3/uL (ref 0.1–1.0)
Monocytes Relative: 8 %
Neutro Abs: 4.4 10*3/uL (ref 1.7–7.7)
Neutrophils Relative %: 66 %
Platelets: 190 10*3/uL (ref 150–400)
RBC: 5.06 MIL/uL (ref 3.87–5.11)
RDW: 12.4 % (ref 11.5–15.5)
WBC: 6.6 10*3/uL (ref 4.0–10.5)
nRBC: 0 % (ref 0.0–0.2)

## 2020-10-04 LAB — BASIC METABOLIC PANEL
Anion gap: 5 (ref 5–15)
BUN: 17 mg/dL (ref 8–23)
CO2: 32 mmol/L (ref 22–32)
Calcium: 9.5 mg/dL (ref 8.9–10.3)
Chloride: 100 mmol/L (ref 98–111)
Creatinine, Ser: 0.71 mg/dL (ref 0.44–1.00)
GFR, Estimated: >60 mL/min (ref >60)
Glucose, Bld: 97 mg/dL (ref 70–99)
Potassium: 3.9 mmol/L (ref 3.5–5.1)
Sodium: 137 mmol/L (ref 135–145)

## 2020-10-04 MED ORDER — ASPIRIN EC 81 MG PO TBEC: 81.0000 mg | DELAYED_RELEASE_TABLET | Freq: Every day | ORAL | 3 refills | Status: DC

## 2020-10-04 NOTE — Patient Instructions (Addendum)
Medication Instructions:  Start asa 81 mg daily  If you need a refill on your cardiac medications before your next appointment, please call your pharmacy.    Lab work: Your physician recommends that you have lab work today/ tomorrow: BMP/ [place] Entrance at [facility] 1st desk on the right to check in, past the screening table Lab hours: Monday- Friday (7:30 am- 5:30 pm)   If you have labs (blood work) drawn today and your tests are completely normal, you will receive your results only by: [place] (if you have MyChart) OR A paper copy in the mail If you have any lab test that is abnormal or we need to change your treatment, we will call you to review the results.   Testing/Procedures:  1) Cardiac Cath:  Your physician has requested that you have a cardiac catheterization. Cardiac catheterization is used to diagnose and/or treat various heart conditions. Doctors may recommend this procedure for a number of different reasons. The most common reason is to evaluate chest pain. Chest pain can be a symptom of coronary artery disease (CAD), and cardiac catheterization can show whether plaque is narrowing or blocking your heart's arteries. This procedure is also used to evaluate the valves, as well as measure the blood flow and oxygen levels in different parts of your heart.   You are scheduled for a Cardiac Catheterization on Friday August 26 with Dr. Kathlyn Sacramento  1. Please arrive at the [facility] 11:00 am (This time is one hour before your procedure to ensure your preparation). Free valet parking service is available.   Special note: Every effort is made to have your procedure done on time. Please understand that emergencies sometimes delay scheduled procedures.  2. Diet: Do not eat solid foods after midnight.  You may have clear liquids until 5am upon the day of the procedure.  3. Labs: today  4. Medication instructions in preparation for your  procedure:   Contrast Allergy: No   On the morning of your procedure, take your Aspirin and any morning medicines NOT listed above.  You may use sips of water.  5. Plan for one night stay--bring personal belongings. 6. Bring a current list of your medications and current insurance cards. 7. You MUST have a responsible person to drive you home. 8. Someone MUST be with you the first 24 hours after you arrive home or your discharge will be delayed. 9. Please wear clothes that are easy to get on and off and wear slip-on shoes.  Thank you for allowing [place] to care for you!   -- [facility]    Follow-Up: At [facility], you and your health needs are our priority.  As part of our continuing mission to provide you with exceptional heart care, we have created designated Provider Care Teams.  These Care Teams include your primary Cardiologist (physician) and Advanced Practice Providers (APPs -  Physician Assistants and Nurse Practitioners) who all work together to provide you with the care you need, when you need it.  You will need a follow up appointment in 1 month  Providers on your designated Care Team:   Murray Hodgkins, NP Christell Faith, PA-C Marrianne Mood, PA-C Cadence Kathlen Mody, [place]  Any Other Special Instructions Will Be Listed Below (If Applicable).  COVID-19 Vaccine Information can be found at: ShippingScam.co.uk For questions related to vaccine distribution or appointments, please email [email or web address] or call [phone].    Coronary Angiogram A coronary angiogram  is an X-ray procedure that is used to examine the arteries in the heart. Contrast dye is injected through a long, thin tube (catheter) into these arteries. Then X-rays are taken to show any blockage in thesearteries. You may have this procedure if you: Are having chest pain, or other symptoms of angina, and you are at  risk for heart disease. Have an abnormal stress test or test of your heart's electrical activity (electrocardiogram, or ECG). Have chest pain and heart failure. Are having irregular heart rhythms. A coronary angiogram or heart catheterization can show if you have valve disease or a disease of the aorta. This procedure can also be used to check theoverall function of your heart muscle. Let your health care provider know about: Any allergies you have, including allergies to medicines or contrast dye. All medicines you are taking, including vitamins, herbs, eye drops, creams, and over-the-counter medicines. Any problems you or family members have had with anesthetic medicines. Any blood disorders you have. Any surgeries you have had. Any history of kidney problems or kidney failure. Any medical conditions you have. Whether you are pregnant or may be pregnant. Whether you are breastfeeding. What are the risks? Generally, this is a safe procedure. However, problems may occur, including: Infection. Allergic reaction to medicines or dyes that are used. Bleeding from the insertion site or other places. Damage to nearby structures, such as blood vessels, or damage to kidneys from contrast dye. Irregular heart rhythms. Stroke (rare). Heart attack (rare). What happens before the procedure? Staying hydrated Follow instructions from your health care provider about hydration, which may include: Up to 2 hours before the procedure - you may continue to drink clear liquids, such as water, clear fruit juice, black coffee, and plain tea.  Eating and drinking restrictions Follow instructions from your health care provider about eating and drinking, which may include: 8 hours before the procedure - stop eating heavy meals or foods, such as meat, fried foods, or fatty foods. 6 hours before the procedure - stop eating light meals or foods, such as toast or cereal. 6 hours before the procedure - stop  drinking milk or drinks that contain milk. 2 hours before the procedure - stop drinking clear liquids. Medicines Ask your health care provider about: Changing or stopping your regular medicines. This is especially important if you are taking diabetes medicines or blood thinners. Taking medicines such as aspirin and ibuprofen. These medicines can thin your blood. Do not take these medicines unless your health care provider tells you to take them. Aspirin may be recommended before coronary angiograms even if you do not normally take it. Taking over-the-counter medicines, vitamins, herbs, and supplements. General instructions Do not use any products that contain nicotine or tobacco for at least 4 weeks before the procedure. These products include cigarettes, e-cigarettes, and chewing tobacco. If you need help quitting, ask your health care provider. You may have an exam or testing. Plan to have someone take you home from the hospital or clinic. If you will be going home right after the procedure, plan to have someone with you for 24 hours. Ask your health care provider: How your insertion site will be marked. What steps will be taken to help prevent infection. These may include: Removing hair at the insertion site. Washing skin with a germ-killing soap. Taking antibiotic medicine. What happens during the procedure?  You will lie on your back on an X-ray table. An IV will be inserted into one of your veins. Electrodes will  be placed on your chest. You will be given one or more of the following: A medicine to help you relax (sedative). A medicine to numb the catheter insertion area (local anesthetic). You will be connected to a continuous ECG monitor. The catheter will be inserted into an artery in one of these areas: Your groin area in your upper thigh. Your wrist. The fold of your arm, near your elbow. An X-ray procedure (fluoroscopy) will be used to help guide the catheter to the opening  of the blood vessel to be used. A dye will be injected into the catheter and X-rays will be taken. The dye will help to show any narrowing or blockages in the heart arteries. Tell your health care provider if you have chest pain or trouble breathing. If blockages are found, another procedure may be done to open the artery. The catheter will be removed after the fluoroscopy is complete. A bandage (dressing) will be placed over the insertion site. Pressure will be applied to stop bleeding. The IV will be removed. The procedure may vary among health care providers and hospitals. What happens after the procedure? Your blood pressure, heart rate, breathing rate, and blood oxygen level will be monitored until you leave the hospital or clinic. You will need to lie still for a few hours, or for as long as told by your health care provider. If the procedure is done through the groin, you will be told not to bend or cross your legs. The insertion site and the pulse in your foot or wrist will be checked often. More blood tests, X-rays, and an ECG may be done. Do not drive for 24 hours if you were given a sedative during your procedure. Summary A coronary angiogram is an X-ray procedure that is used to examine the arteries in the heart. Contrast dye is injected through a long, thin tube (catheter) into each artery. Tell your health care provider about any allergies you have, including allergies to contrast dye. After the procedure, you will need to lie still for a few hours and drink plenty of fluids. This information is not intended to replace advice given to you by your health care provider. Make sure you discuss any questions you have with your healthcare provider. Document Revised: 08/22/2018 Document Reviewed: 08/22/2018 Elsevier Patient Education  [place].

## 2020-10-06 ENCOUNTER — Telehealth: Payer: Self-pay | Admitting: Cardiovascular Disease

## 2020-10-06 NOTE — Telephone Encounter (Signed)
Eric with [employer] is calling on behalf of [place] regarding the authorization for patient's heart cath on 10/08/20 with Dr. [name]. He requested a call back to discuss.  Phone #: [phone] (ext#: [phone]) Case #: [id number]

## 2020-10-07 ENCOUNTER — Ambulatory Visit: Payer: Medicare PPO | Admitting: Cardiology

## 2020-10-08 ENCOUNTER — Ambulatory Visit
Admission: RE | Admit: 2020-10-08 | Discharge: 2020-10-08 | Disposition: A | Discharge status: Home / Self Care | Payer: Medicare PPO | Attending: Cardiovascular Disease | Admitting: Cardiovascular Disease

## 2020-10-08 ENCOUNTER — Telehealth: Payer: Self-pay

## 2020-10-08 ENCOUNTER — Encounter
Admission: RE | Disposition: A | Discharge status: Home / Self Care | Payer: Self-pay | Source: Home / Self Care | Attending: Cardiovascular Disease

## 2020-10-08 ENCOUNTER — Other Ambulatory Visit: Payer: Self-pay | Admitting: Cardiovascular Disease

## 2020-10-08 ENCOUNTER — Encounter: Payer: Self-pay | Admitting: Cardiovascular Disease

## 2020-10-08 ENCOUNTER — Other Ambulatory Visit: Payer: Self-pay

## 2020-10-08 DIAGNOSIS — I959 Hypotension, unspecified: Secondary | ICD-10-CM | POA: Diagnosis not present

## 2020-10-08 DIAGNOSIS — Z79899 Other long term (current) drug therapy: Secondary | ICD-10-CM | POA: Diagnosis not present

## 2020-10-08 DIAGNOSIS — I208 Other forms of angina pectoris: Secondary | ICD-10-CM | POA: Insufficient documentation

## 2020-10-08 DIAGNOSIS — Z882 Allergy status to sulfonamides status: Secondary | ICD-10-CM | POA: Diagnosis not present

## 2020-10-08 DIAGNOSIS — E782 Mixed hyperlipidemia: Secondary | ICD-10-CM | POA: Diagnosis not present

## 2020-10-08 DIAGNOSIS — Z880 Allergy status to penicillin: Secondary | ICD-10-CM | POA: Insufficient documentation

## 2020-10-08 DIAGNOSIS — I471 Supraventricular tachycardia: Secondary | ICD-10-CM | POA: Insufficient documentation

## 2020-10-08 DIAGNOSIS — I200 Unstable angina: Secondary | ICD-10-CM

## 2020-10-08 DIAGNOSIS — I2089 Other forms of angina pectoris: Secondary | ICD-10-CM | POA: Insufficient documentation

## 2020-10-08 DIAGNOSIS — R519 Headache, unspecified: Secondary | ICD-10-CM | POA: Insufficient documentation

## 2020-10-08 DIAGNOSIS — I25110 Atherosclerotic heart disease of native coronary artery with unstable angina pectoris: Secondary | ICD-10-CM | POA: Diagnosis not present

## 2020-10-08 DIAGNOSIS — Z881 Allergy status to other antibiotic agents status: Secondary | ICD-10-CM | POA: Insufficient documentation

## 2020-10-08 DIAGNOSIS — I10 Essential (primary) hypertension: Secondary | ICD-10-CM | POA: Diagnosis not present

## 2020-10-08 DIAGNOSIS — I4892 Unspecified atrial flutter: Secondary | ICD-10-CM | POA: Diagnosis not present

## 2020-10-08 DIAGNOSIS — R42 Dizziness and giddiness: Secondary | ICD-10-CM | POA: Insufficient documentation

## 2020-10-08 HISTORY — PX: LEFT HEART CATH AND CORONARY ANGIOGRAPHY: CATH118249

## 2020-10-08 SURGERY — LEFT HEART CATH AND CORONARY ANGIOGRAPHY
Anesthesia: Moderate Sedation

## 2020-10-08 MED ORDER — LIDOCAINE HCL 1 % IJ SOLN
INTRAMUSCULAR | Status: AC
  Filled 2020-10-08: qty 20

## 2020-10-08 MED ORDER — FENTANYL CITRATE (PF) 100 MCG/2ML IJ SOLN
INTRAMUSCULAR | Status: DC | PRN
  Administered 2020-10-08: 25 ug via INTRAVENOUS

## 2020-10-08 MED ORDER — VERAPAMIL HCL 2.5 MG/ML IV SOLN
INTRAVENOUS | Status: DC | PRN
  Administered 2020-10-08: 2.5 mg via INTRA_ARTERIAL

## 2020-10-08 MED ORDER — FENTANYL CITRATE PF 50 MCG/ML IJ SOSY
PREFILLED_SYRINGE | INTRAMUSCULAR | Status: AC
  Filled 2020-10-08: qty 1

## 2020-10-08 MED ORDER — SODIUM CHLORIDE 0.9 % WEIGHT BASED INFUSION: 1.0000 mL/kg/h | INTRAVENOUS | Status: DC

## 2020-10-08 MED ORDER — ASPIRIN 81 MG PO CHEW
81.0000 mg | CHEWABLE_TABLET | ORAL | Status: AC
  Administered 2020-10-08: 81 mg via ORAL

## 2020-10-08 MED ORDER — SODIUM CHLORIDE 0.9% FLUSH: 3.0000 mL | Freq: Two times a day (BID) | INTRAVENOUS | Status: DC

## 2020-10-08 MED ORDER — HEPARIN SODIUM (PORCINE) 1000 UNIT/ML IJ SOLN
INTRAMUSCULAR | Status: DC | PRN
  Administered 2020-10-08: 2600 [IU] via INTRAVENOUS

## 2020-10-08 MED ORDER — SODIUM CHLORIDE 0.9% FLUSH: 3.0000 mL | INTRAVENOUS | Status: DC | PRN

## 2020-10-08 MED ORDER — LIDOCAINE HCL (PF) 1 % IJ SOLN
INTRAMUSCULAR | Status: DC | PRN
  Administered 2020-10-08: 2 mL

## 2020-10-08 MED ORDER — ONDANSETRON HCL 4 MG/2ML IJ SOLN: 4.0000 mg | Freq: Four times a day (QID) | INTRAMUSCULAR | Status: DC | PRN

## 2020-10-08 MED ORDER — HEPARIN SODIUM (PORCINE) 1000 UNIT/ML IJ SOLN
INTRAMUSCULAR | Status: AC
  Filled 2020-10-08: qty 1

## 2020-10-08 MED ORDER — SODIUM CHLORIDE 0.9 % IV SOLN: INTRAVENOUS | Status: DC

## 2020-10-08 MED ORDER — ASPIRIN 81 MG PO CHEW
CHEWABLE_TABLET | ORAL | Status: AC
  Filled 2020-10-08: qty 1

## 2020-10-08 MED ORDER — MIDAZOLAM HCL 2 MG/2ML IJ SOLN
INTRAMUSCULAR | Status: AC
  Filled 2020-10-08: qty 2

## 2020-10-08 MED ORDER — MIDAZOLAM HCL 2 MG/2ML IJ SOLN
INTRAMUSCULAR | Status: DC | PRN
  Administered 2020-10-08: 0.5 mg via INTRAVENOUS

## 2020-10-08 MED ORDER — CLOPIDOGREL BISULFATE 75 MG PO TABS: 75.0000 mg | ORAL_TABLET | Freq: Every day | ORAL | 11 refills | Status: DC

## 2020-10-08 MED ORDER — IOHEXOL 350 MG/ML SOLN
INTRAVENOUS | Status: DC | PRN
  Administered 2020-10-08: 35 mL

## 2020-10-08 MED ORDER — ACETAMINOPHEN 325 MG PO TABS: 650.0000 mg | ORAL_TABLET | ORAL | Status: DC | PRN

## 2020-10-08 MED ORDER — VERAPAMIL HCL 2.5 MG/ML IV SOLN
INTRAVENOUS | Status: AC
  Filled 2020-10-08: qty 2

## 2020-10-08 MED ORDER — SODIUM CHLORIDE 0.9 % WEIGHT BASED INFUSION: 3.0000 mL/kg/h | INTRAVENOUS | Status: AC

## 2020-10-08 MED ORDER — HEPARIN (PORCINE) IN NACL 1000-0.9 UT/500ML-% IV SOLN
INTRAVENOUS | Status: DC | PRN
  Administered 2020-10-08 (×2): 500 mL

## 2020-10-08 MED ORDER — SODIUM CHLORIDE 0.9 % IV SOLN: 250.0000 mL | INTRAVENOUS | Status: DC | PRN

## 2020-10-08 MED ORDER — HEPARIN (PORCINE) IN NACL 1000-0.9 UT/500ML-% IV SOLN
INTRAVENOUS | Status: AC
  Filled 2020-10-08: qty 1000

## 2020-10-08 SURGICAL SUPPLY — 12 items
CATH INFINITI 5 FR JL3.5 (CATHETERS) ×1 IMPLANT
CATH INFINITI JR4 5F (CATHETERS) ×1 IMPLANT
DEVICE RAD TR BAND REGULAR (VASCULAR PRODUCTS) ×2 IMPLANT
DRAPE BRACHIAL (DRAPES) ×1 IMPLANT
GLIDESHEATH SLEND SS 6F .021 (SHEATH) ×1 IMPLANT
GUIDEWIRE INQWIRE 1.5J.035X260 (WIRE) IMPLANT
INQWIRE 1.5J .035X260CM (WIRE) ×2
KIT SYRINGE INJ CVI SPIKEX1 (MISCELLANEOUS) ×1 IMPLANT
PACK CARDIAC CATH (CUSTOM PROCEDURE TRAY) ×2 IMPLANT
PROTECTION STATION PRESSURIZED (MISCELLANEOUS) ×2
SET ATX SIMPLICITY (MISCELLANEOUS) ×1 IMPLANT
STATION PROTECTION PRESSURIZED (MISCELLANEOUS) IMPLANT

## 2020-10-08 NOTE — Progress Notes (Signed)
Patient discharged home with dressing intact, clean and dry. No progression of bruising is noted and area remains marked for patient to monitor while at home. Instructed to call the office of Dr. [name] should she have any further concerns or problems.

## 2020-10-08 NOTE — Interval H&P Note (Signed)
Cath Lab Visit (complete for each Cath Lab visit)  Clinical Evaluation Leading to the Procedure:   ACS: No.  Non-ACS:    Anginal Classification: CCS III  Anti-ischemic medical therapy: No Therapy  Non-Invasive Test Results: High-risk stress test findings: cardiac mortality >3%/year  Prior CABG: No previous CABG      History and Physical Interval Note:  10/08/2020 12:43 PM  [name]  has presented today for surgery, with the diagnosis of Unstable angina.  The various methods of treatment have been discussed with the patient and family. After consideration of risks, benefits and other options for treatment, the patient has consented to  Procedure(s): LEFT HEART CATH AND CORONARY ANGIOGRAPHY (N/A) as a surgical intervention.  The patient's history has been reviewed, patient examined, no change in status, stable for surgery.  I have reviewed the patient's chart and labs.  Questions were answered to the patient's satisfaction.     Theresa Hale

## 2020-10-08 NOTE — Progress Notes (Signed)
Dr. Corky Sox to bedside to recheck site. States to take TR band off and watch for another hour. TR band removed, clear guaze dressing applied. Area of bruising outlined with skin marker so patient can identify if bruising is getting any larger once she returns home. Patient and family member amenable to this plan. Patient will be discharged at 1750 if no progression in bruising.

## 2020-10-08 NOTE — Progress Notes (Signed)
While patient getting dressed for discharge,  she notified this nurse of new bruising to palmar side of thumb. Pressure held and MD notified. Cath lab specialist came over and held pressure. Replaced TR band with 3m of air. Will remain in place for approximately 30 mins and reevaluate. Pulses remain intact. Will continue to monitor.

## 2020-10-08 NOTE — Telephone Encounter (Signed)
Secure chat received from Dr. [name] "This patient had cardiac catheterization done today.  Please schedule her for LAD PCI to be done on Friday, September 2 at 9:30 AM at [facility] with me.  The patient is already aware.  She does not need labs."  [employer]. They are not able to put this case on until the pt is d/c from [facility]. I am off on Monday but can call on Tues to get it scheduled. I have asked Pamala Hurry to place a note so that the date and time can be held for this patient.

## 2020-10-11 ENCOUNTER — Encounter: Payer: Self-pay | Admitting: Cardiovascular Disease

## 2020-10-11 NOTE — Telephone Encounter (Signed)
Patient calling  Wants to re-clarify cath instructions Please call to discuss

## 2020-10-11 NOTE — Telephone Encounter (Signed)
Pt aware she will receive a call tomorrow 10/12/20 to discuss in detail instructions for procedure 10/15/20.  Pt has no further questions/concerns at this time.

## 2020-10-12 NOTE — Telephone Encounter (Signed)
Patient given verbal pre-procedure instructions with verbalized understanding.  [facility] Cardiac Cath Instructions  You are scheduled for a PCI on: Friday 10/15/20 with Dr. [name] Please arrive at 9 am on the day of your procedure Please expect a call from our [facility] to pre-register you Do not eat/drink anything after midnight Someone will need to drive you home It is recommended someone be with you for the first 24 hours after your procedure Wear clothes that are easy to get on/off and wear slip on shoes if possible   Medications bring a current list of all medications with you  _X__ You may take all of your medications the morning of your procedure with enough water to swallow safely  Patient advised to take her Asa 81 mg and Plavix 75 mg the morning of the procedure.  Day of your procedure: Arrive at the [facility] entrance.  Free valet service is available.  After entering the [facility] please check-in at the registration desk (1st desk on your right) to receive your armband. After receiving your armband someone will escort you to the cardiac cath/special procedures waiting area.  The usual length of stay after your procedure is about 2 to 3 hours.  This can vary.  If you have any questions, please call our office at [phone], or you may call the cardiac cath lab at [facility] directly at [phone]

## 2020-10-15 ENCOUNTER — Ambulatory Visit
Admission: RE | Admit: 2020-10-15 | Discharge: 2020-10-16 | Disposition: A | Discharge status: Home / Self Care | Payer: Medicare PPO | Attending: Cardiovascular Disease | Admitting: Cardiovascular Disease

## 2020-10-15 ENCOUNTER — Other Ambulatory Visit: Payer: Self-pay

## 2020-10-15 ENCOUNTER — Encounter: Payer: Self-pay | Admitting: Cardiovascular Disease

## 2020-10-15 ENCOUNTER — Encounter
Admission: RE | Disposition: A | Discharge status: Home / Self Care | Payer: Self-pay | Source: Home / Self Care | Attending: Cardiovascular Disease

## 2020-10-15 DIAGNOSIS — Z881 Allergy status to other antibiotic agents status: Secondary | ICD-10-CM | POA: Diagnosis not present

## 2020-10-15 DIAGNOSIS — Z79899 Other long term (current) drug therapy: Secondary | ICD-10-CM | POA: Insufficient documentation

## 2020-10-15 DIAGNOSIS — I25118 Atherosclerotic heart disease of native coronary artery with other forms of angina pectoris: Secondary | ICD-10-CM | POA: Diagnosis not present

## 2020-10-15 DIAGNOSIS — Z882 Allergy status to sulfonamides status: Secondary | ICD-10-CM | POA: Insufficient documentation

## 2020-10-15 DIAGNOSIS — I208 Other forms of angina pectoris: Secondary | ICD-10-CM | POA: Diagnosis present

## 2020-10-15 DIAGNOSIS — I4892 Unspecified atrial flutter: Secondary | ICD-10-CM | POA: Insufficient documentation

## 2020-10-15 DIAGNOSIS — I471 Supraventricular tachycardia: Secondary | ICD-10-CM | POA: Insufficient documentation

## 2020-10-15 DIAGNOSIS — K219 Gastro-esophageal reflux disease without esophagitis: Secondary | ICD-10-CM | POA: Diagnosis not present

## 2020-10-15 DIAGNOSIS — Z955 Presence of coronary angioplasty implant and graft: Secondary | ICD-10-CM | POA: Diagnosis not present

## 2020-10-15 DIAGNOSIS — Z880 Allergy status to penicillin: Secondary | ICD-10-CM | POA: Insufficient documentation

## 2020-10-15 DIAGNOSIS — I10 Essential (primary) hypertension: Secondary | ICD-10-CM | POA: Diagnosis not present

## 2020-10-15 DIAGNOSIS — E782 Mixed hyperlipidemia: Secondary | ICD-10-CM | POA: Insufficient documentation

## 2020-10-15 DIAGNOSIS — I2510 Atherosclerotic heart disease of native coronary artery without angina pectoris: Secondary | ICD-10-CM

## 2020-10-15 DIAGNOSIS — E785 Hyperlipidemia, unspecified: Secondary | ICD-10-CM

## 2020-10-15 HISTORY — PX: CORONARY STENT INTERVENTION: CATH118234

## 2020-10-15 LAB — POCT ACTIVATED CLOTTING TIME: Activated Clotting Time: 410 seconds

## 2020-10-15 SURGERY — CORONARY STENT INTERVENTION
Anesthesia: Moderate Sedation

## 2020-10-15 MED ORDER — BIVALIRUDIN BOLUS VIA INFUSION - CUPID
INTRAVENOUS | Status: DC | PRN
  Administered 2020-10-15: 39.15 mg via INTRAVENOUS

## 2020-10-15 MED ORDER — MIDAZOLAM HCL 2 MG/2ML IJ SOLN
INTRAMUSCULAR | Status: AC
  Filled 2020-10-15: qty 2

## 2020-10-15 MED ORDER — SODIUM CHLORIDE 0.9 % WEIGHT BASED INFUSION: 1.0000 mL/kg/h | INTRAVENOUS | Status: AC

## 2020-10-15 MED ORDER — SODIUM CHLORIDE 0.9 % IV SOLN: 250.0000 mL | INTRAVENOUS | Status: DC | PRN

## 2020-10-15 MED ORDER — HEPARIN (PORCINE) IN NACL 1000-0.9 UT/500ML-% IV SOLN
INTRAVENOUS | Status: AC
  Filled 2020-10-15: qty 1000

## 2020-10-15 MED ORDER — SODIUM CHLORIDE 0.9% FLUSH: 3.0000 mL | INTRAVENOUS | Status: DC | PRN

## 2020-10-15 MED ORDER — SODIUM CHLORIDE 0.9 % WEIGHT BASED INFUSION: 3.0000 mL/kg/h | INTRAVENOUS | Status: DC

## 2020-10-15 MED ORDER — SODIUM CHLORIDE 0.9 % IV SOLN
INTRAVENOUS | Status: DC | PRN
  Administered 2020-10-15: 1.75 mg/kg/h via INTRAVENOUS

## 2020-10-15 MED ORDER — FENTANYL CITRATE PF 50 MCG/ML IJ SOSY
PREFILLED_SYRINGE | INTRAMUSCULAR | Status: AC
  Filled 2020-10-15: qty 1

## 2020-10-15 MED ORDER — LIDOCAINE HCL 1 % IJ SOLN
INTRAMUSCULAR | Status: AC
  Filled 2020-10-15: qty 20

## 2020-10-15 MED ORDER — ASPIRIN 81 MG PO CHEW: 81.0000 mg | CHEWABLE_TABLET | ORAL | Status: DC

## 2020-10-15 MED ORDER — MIDAZOLAM HCL 2 MG/2ML IJ SOLN
INTRAMUSCULAR | Status: DC | PRN
  Administered 2020-10-15 (×2): 1 mg via INTRAVENOUS

## 2020-10-15 MED ORDER — METOPROLOL TARTRATE 25 MG PO TABS
12.5000 mg | ORAL_TABLET | Freq: Two times a day (BID) | ORAL | Status: DC
  Filled 2020-10-15: qty 1

## 2020-10-15 MED ORDER — CALCIUM CARBONATE-VITAMIN D 500-200 MG-UNIT PO TABS
1.0000 | ORAL_TABLET | Freq: Every morning | ORAL | Status: DC
  Filled 2020-10-15 (×2): qty 1

## 2020-10-15 MED ORDER — SODIUM CHLORIDE 0.9% FLUSH
3.0000 mL | Freq: Two times a day (BID) | INTRAVENOUS | Status: DC
  Administered 2020-10-15: 3 mL via INTRAVENOUS

## 2020-10-15 MED ORDER — LIDOCAINE HCL (PF) 1 % IJ SOLN
INTRAMUSCULAR | Status: DC | PRN
  Administered 2020-10-15: 5 mL

## 2020-10-15 MED ORDER — CLOPIDOGREL BISULFATE 75 MG PO TABS
75.0000 mg | ORAL_TABLET | Freq: Every day | ORAL | Status: DC
  Filled 2020-10-15: qty 1

## 2020-10-15 MED ORDER — IOHEXOL 350 MG/ML SOLN
INTRAVENOUS | Status: DC | PRN
  Administered 2020-10-15: 84 mL

## 2020-10-15 MED ORDER — ONDANSETRON HCL 4 MG/2ML IJ SOLN: 4.0000 mg | Freq: Four times a day (QID) | INTRAMUSCULAR | Status: DC | PRN

## 2020-10-15 MED ORDER — ACETAMINOPHEN 325 MG PO TABS: 650.0000 mg | ORAL_TABLET | ORAL | Status: DC | PRN

## 2020-10-15 MED ORDER — ASPIRIN EC 81 MG PO TBEC
81.0000 mg | DELAYED_RELEASE_TABLET | Freq: Every evening | ORAL | Status: DC
  Administered 2020-10-15: 81 mg via ORAL
  Filled 2020-10-15: qty 1

## 2020-10-15 MED ORDER — ROSUVASTATIN CALCIUM 5 MG PO TABS
5.0000 mg | ORAL_TABLET | Freq: Every morning | ORAL | Status: DC
  Filled 2020-10-15 (×2): qty 1

## 2020-10-15 MED ORDER — FENTANYL CITRATE (PF) 100 MCG/2ML IJ SOLN
INTRAMUSCULAR | Status: DC | PRN
  Administered 2020-10-15: 50 ug via INTRAVENOUS
  Administered 2020-10-15: 25 ug via INTRAVENOUS

## 2020-10-15 MED ORDER — SODIUM CHLORIDE 0.9 % WEIGHT BASED INFUSION: 1.0000 mL/kg/h | INTRAVENOUS | Status: DC

## 2020-10-15 MED ORDER — HEPARIN (PORCINE) IN NACL 2000-0.9 UNIT/L-% IV SOLN
INTRAVENOUS | Status: DC | PRN
  Administered 2020-10-15: 1000 mL

## 2020-10-15 MED ORDER — BIVALIRUDIN TRIFLUOROACETATE 250 MG IV SOLR
INTRAVENOUS | Status: AC
  Filled 2020-10-15: qty 250

## 2020-10-15 MED ORDER — SODIUM CHLORIDE 0.9% FLUSH: 3.0000 mL | Freq: Two times a day (BID) | INTRAVENOUS | Status: DC

## 2020-10-15 SURGICAL SUPPLY — 22 items
BALLN TREK RX 2.25X15 (BALLOONS) ×2
BALLN ~~LOC~~ EUPHORA RX 2.75X20 (BALLOONS) ×2
BALLOON TREK RX 2.25X15 (BALLOONS) IMPLANT
BALLOON ~~LOC~~ EUPHORA RX 2.75X20 (BALLOONS) IMPLANT
CATH LAUNCHER 6FR EBU3.5 (CATHETERS) ×1 IMPLANT
CATH SHOCKWAVE 2.5X12 (CATHETERS) IMPLANT
CATHETER SHOCKWAVE 2.5X12 (CATHETERS) ×2
DEVICE CLOSURE MYNXGRIP 6/7F (Vascular Products) ×1 IMPLANT
KIT ENCORE 26 ADVANTAGE (KITS) ×1 IMPLANT
KIT MICROPUNCTURE NIT STIFF (SHEATH) ×2 IMPLANT
KIT SYRINGE INJ CVI SPIKEX1 (MISCELLANEOUS) ×1 IMPLANT
PACK CARDIAC CATH (CUSTOM PROCEDURE TRAY) ×2 IMPLANT
PROTECTION STATION PRESSURIZED (MISCELLANEOUS) ×2
SET ATX SIMPLICITY (MISCELLANEOUS) ×1 IMPLANT
SHEATH AVANTI 6FR X 11CM (SHEATH) ×1 IMPLANT
STATION PROTECTION PRESSURIZED (MISCELLANEOUS) IMPLANT
STENT ONYX FRONTIER 2.5X38 (Permanent Stent) ×1 IMPLANT
TUBING CIL FLEX 10 FLL-RA (TUBING) ×1 IMPLANT
VALVE COPILOT STAT (MISCELLANEOUS) ×1 IMPLANT
WIRE ASAHI PROWATER 180CM (WIRE) ×1 IMPLANT
WIRE GUIDERIGHT .035X150 (WIRE) ×1 IMPLANT
WIRE RUNTHROUGH .014X180CM (WIRE) ×1 IMPLANT

## 2020-10-15 NOTE — Interval H&P Note (Signed)
Cath Lab Visit (complete for each Cath Lab visit)  Clinical Evaluation Leading to the Procedure:   ACS: No.  Non-ACS:    Anginal Classification: CCS III  Anti-ischemic medical therapy: Minimal Therapy (1 class of medications)  Non-Invasive Test Results: High-risk stress test findings: cardiac mortality >3%/year  Prior CABG: No previous CABG      History and Physical Interval Note:  10/15/2020 10:54 AM  Theresa Hale  has presented today for surgery, with the diagnosis of effort angina.  The various methods of treatment have been discussed with the patient and family. After consideration of risks, benefits and other options for treatment, the patient has consented to  Procedure(s): CORONARY STENT INTERVENTION (N/A) as a surgical intervention.  The patient's history has been reviewed, patient examined, no change in status, stable for surgery.  I have reviewed the patient's chart and labs.  Questions were answered to the patient's satisfaction.     Theresa Hale

## 2020-10-16 DIAGNOSIS — I2510 Atherosclerotic heart disease of native coronary artery without angina pectoris: Secondary | ICD-10-CM

## 2020-10-16 DIAGNOSIS — I471 Supraventricular tachycardia: Secondary | ICD-10-CM | POA: Diagnosis not present

## 2020-10-16 DIAGNOSIS — I208 Other forms of angina pectoris: Secondary | ICD-10-CM | POA: Diagnosis not present

## 2020-10-16 DIAGNOSIS — Z880 Allergy status to penicillin: Secondary | ICD-10-CM | POA: Diagnosis not present

## 2020-10-16 DIAGNOSIS — I25118 Atherosclerotic heart disease of native coronary artery with other forms of angina pectoris: Secondary | ICD-10-CM | POA: Diagnosis not present

## 2020-10-16 DIAGNOSIS — I10 Essential (primary) hypertension: Secondary | ICD-10-CM

## 2020-10-16 DIAGNOSIS — I4892 Unspecified atrial flutter: Secondary | ICD-10-CM | POA: Diagnosis not present

## 2020-10-16 DIAGNOSIS — K219 Gastro-esophageal reflux disease without esophagitis: Secondary | ICD-10-CM | POA: Diagnosis not present

## 2020-10-16 DIAGNOSIS — Z955 Presence of coronary angioplasty implant and graft: Secondary | ICD-10-CM | POA: Diagnosis not present

## 2020-10-16 DIAGNOSIS — E782 Mixed hyperlipidemia: Secondary | ICD-10-CM | POA: Diagnosis not present

## 2020-10-16 DIAGNOSIS — Z881 Allergy status to other antibiotic agents status: Secondary | ICD-10-CM | POA: Diagnosis not present

## 2020-10-16 DIAGNOSIS — E785 Hyperlipidemia, unspecified: Secondary | ICD-10-CM

## 2020-10-16 LAB — BASIC METABOLIC PANEL
Anion gap: 7 (ref 5–15)
BUN: 14 mg/dL (ref 8–23)
CO2: 27 mmol/L (ref 22–32)
Calcium: 9.1 mg/dL (ref 8.9–10.3)
Chloride: 102 mmol/L (ref 98–111)
Creatinine, Ser: 0.62 mg/dL (ref 0.44–1.00)
GFR, Estimated: >60 mL/min (ref >60)
Glucose, Bld: 97 mg/dL (ref 70–99)
Potassium: 4.1 mmol/L (ref 3.5–5.1)
Sodium: 136 mmol/L (ref 135–145)

## 2020-10-16 LAB — CBC
HCT: 37.7 % (ref 36.0–46.0)
Hemoglobin: 13.6 g/dL (ref 12.0–15.0)
MCH: 29.6 pg (ref 26.0–34.0)
MCHC: 36.1 g/dL — ABNORMAL HIGH (ref 30.0–36.0)
MCV: 82 fL (ref 80.0–100.0)
Platelets: 168 10*3/uL (ref 150–400)
RBC: 4.6 MIL/uL (ref 3.87–5.11)
RDW: 12.6 % (ref 11.5–15.5)
WBC: 7.7 10*3/uL (ref 4.0–10.5)
nRBC: 0 % (ref 0.0–0.2)

## 2020-10-16 MED ORDER — PANTOPRAZOLE SODIUM 20 MG PO TBEC
20.0000 mg | DELAYED_RELEASE_TABLET | Freq: Every day | ORAL | Status: DC
  Filled 2020-10-16: qty 1

## 2020-10-16 MED ORDER — PANTOPRAZOLE SODIUM 20 MG PO TBEC: 20.0000 mg | DELAYED_RELEASE_TABLET | Freq: Every day | ORAL | 3 refills | Status: DC

## 2020-10-16 MED ORDER — ROSUVASTATIN CALCIUM 10 MG PO TABS: 20.0000 mg | ORAL_TABLET | Freq: Every morning | ORAL | Status: DC

## 2020-10-16 MED ORDER — ROSUVASTATIN CALCIUM 20 MG PO TABS: 20.0000 mg | ORAL_TABLET | Freq: Every morning | ORAL | 3 refills | Status: DC

## 2020-10-16 NOTE — Discharge Instructions (Signed)
You have received care from [facility] during this hospital stay and we look forward to continuing to provide you with excellent care in our office settings after you've left the hospital.  In order to assure a smoother transition to home following your discharge from the hospital, we will likely have you see one of our nurse practitioners or physician assistants within a few weeks of discharge.  Our advanced practice providers work closely with your physician in order to address all of your heart's needs in a timely manner.  More information about all of our providers may be found here: [email or web address]   Please plan to bring all of your prescriptions to your follow-up appointment and don't hesitate to contact us with questions or concerns.  [facility] [address] [place], [place] [address]  No lifting over 5 lbs for 1 week. No sexual activity for 1 week. You may return to work based on recommendations at the time of your office follow-up. Keep procedure site clean & dry. If you notice increased pain, swelling, bleeding or pus, call/return!  You may shower, but no soaking baths/hot tubs/pools for 1 week.   The below instructions provide you with information on caring for yourself after your procedure. Your caregiver may also give you more specific instructions. Your treatment has been planned according to current medical practices, but problems sometimes occur. Call your caregiver if you have any problems or questions after your procedure. HOME CARE INSTRUCTIONS You may shower 24 hours after the procedure. Remove the bandage (dressing) and gently wash the site with plain soap and water. Gently pat the site dry.  Do not apply powder or lotion to the site.  Do not sit in a bathtub, swimming pool, or whirlpool for 5 to 7 days.  No bending, squatting, or lifting  anything over 10 pounds (4.5 kg) as directed by your caregiver.  Inspect the site at least twice daily.  Do not drive home if you are discharged the same day of the procedure. Have someone else drive you.  What to expect: Any bruising will usually fade within 1 to 2 weeks.  Blood that collects in the tissue (hematoma) may be painful to the touch. It should usually decrease in size and tenderness within 1 to 2 weeks.  SEEK IMMEDIATE MEDICAL CARE IF: You have unusual pain at the groin site or down the affected leg.  You have redness, warmth, swelling, or pain at the groin site.  You have drainage (other than a small amount of blood on the dressing).  You have chills.  You have a fever or persistent symptoms for more than 72 hours.  You have a fever and your symptoms suddenly get worse.  Your leg becomes pale, cool, tingly, or numb.  You have heavy bleeding from the site. Hold pressure on the site.   -----------------------------     10 Habits of Highly Healthy People  [facility] wants to help you get well and stay well.  Live a longer, healthier life by practicing healthy habits every day.  1.  Visit your primary care provider regularly. 2.  Make time for family and friends.  Healthy relationships are important. 3.  Take medications as directed by your provider. 4.  Maintain a healthy weight and a trim waistline. 5.  Eat healthy meals and snacks, rich in fruits, vegetables, whole grains, and lean proteins. 6.  Get moving every day - aim for  150 minutes of moderate physical activity each week. 7.  Don't smoke. 8.  Avoid alcohol or drink in moderation. 9.  Manage stress through meditation or mindful relaxation. 10.  Get seven to nine hours of quality sleep each night.  Want more information on healthy habits?  To learn more about these and other healthy habits, visit [employer]it.  **PLEASE REMEMBER TO BRING ALL OF YOUR MEDICATIONS TO EACH OF YOUR FOLLOW-UP OFFICE VISITS.

## 2020-10-16 NOTE — Discharge Summary (Signed)
Discharge Summary    Patient ID: Theresa Hale MRN: [id number]; DOB: Oct 14, 1955  Admit date: 10/15/2020 Discharge date: 10/16/2020  PCP:  Caryl Bis, MD   [facility] Providers Cardiologist:  Ida Rogue, MD  Electrophysiologist:  Constance Haw, MD       Discharge Diagnoses    Principal Problem:   Effort angina [facility]) Active Problems:   Coronary artery disease   Atrial flutter (HCC)   Paroxysmal SVT (supraventricular tachycardia) ([place])   Hypertension   Hyperlipidemia LDL goal <70   GERD (gastroesophageal reflux disease)    Diagnostic Studies/Procedures    PCI 10/15/20   Mid LAD lesion is 70% stenosed.   Prox LAD lesion is 80% stenosed.   A drug-eluting stent in the proximal and mid LAD was successfully placed using a STENT ONYX FRONTIER 2.5X38.   Post intervention, there is a 0% residual stenosis. Conclusion: Single-vessel coronary artery disease including 80% heavily calcified proximal LAD, and 70% mid LAD stenoses. Shockwave lithotripsy to the proximal LAD using a 2.5 mm shockwave balloon at 4 ATM for 40 pulses. Successful PCI to the proximal and mid LAD with placement of a 2.5 x 38 mm Onyx DES with excellent angiographic result and TIMI-3 flow. Recommendation: Dual antiplatelet therapy with aspirin and Plavix for 6 months, ideally longer. Ongoing aggressive secondary prevention Will observe overnight and discharge tomorrow.  2 hours bedrest. Mynx closure device used in the right femoral artery.   _____________   History of Present Illness     Theresa Hale is a 65 y.o. female with history of CAD, symptomatic atrial flutter 05/2016 & 12/04/2019 s/p ablation 12/2019, pSVT, previous intolerance of beta-blockers due to hypotension / near syncope, hypertension, hyperlipidemia, GERD, breast cancer, anxiety, and admitted 10/15/2020 following PCI performed 10/15/2020.  She is followed by EP in [place] and [place] in [place].   She was seen in clinic  10/04/2020 by Dr. Rockey Situ for follow-up of her history of tachycardia, atrial flutter, and angina.  It was noted that previous cardiac CTA showed severe proximal to mid LAD stenosis with FFR of 0.7.  She reported continued chest tightness and shortness of breath with recommendation for LHC, scheduled for 10/08/2020.  Subsequent LHC 10/08/2020 showed single-vessel CAD pLAD 80%s, mLAD 70%s, nl LVEDP of 13 mmHg, and no evidence of aortic valve stenosis.  She was started on DAPT with ASA and Plavix.  Recommendation was for PCI to the p-mLAD using shockwave lithotripsy.  Ongoing aggressive secondary prevention was also recommended.  She was thus scheduled for recommended PCI 10/15/2020.  Hospital Course     Consultants: None  On 10/15/2020, she presented to [facility] and underwent PCI to the p-mLAD via her right femoral artery and using shockwave lithotripsy with subsequent DES placement to the LAD.  DAPT was recommended with ASA 81 mg daily and Plavix 75 mg daily was recommended for at least 6 months, ideally longer.  Ongoing aggressive secondary prevention was recommended with statin and lifestyle changes.    She was observed overnight without any reported acute events.    Today, 10/16/2020, labs have been reviewed and stable.  Vital stable.  MD has seen and examined the patient today and feels she is stable for discharge. Per rounding MD, Lopressor 12.55m BID discontinued at discharge per patient report of intolerance to beta-blockers in the past.  She was started on Protonix 20 mg daily at discharge, given DAPT with known history of GERD.  Per guidelines, Crestor dose was increased to Crestor 20  mg daily.  Medication changes have been reviewed with the patient's understanding today and pharmacy confirmed.  Post catheterization care instructions reviewed and have been provided in the patient's AVS.  Request for follow-up appointment to be arranged and message sent to [facility] office scheduling team.  Did the patient  have an acute coronary syndrome (MI, NSTEMI, STEMI, etc) this admission?:  No                               Did the patient have a percutaneous coronary intervention (stent / angioplasty)?:  Yes.     Cath/PCI Registry Performance & Quality Measures: Aspirin prescribed? - Yes ADP Receptor Inhibitor (Plavix/Clopidogrel, Brilinta/Ticagrelor or Effient/Prasugrel) prescribed (includes medically managed patients)? - Yes High Intensity Statin (Lipitor 40-77m or Crestor 20-426m prescribed? - Yes For EF <40%, was ACEI/ARB prescribed? - Not Applicable (EF >/= 4026%For EF <40%, Aldosterone Antagonist (Spironolactone or Eplerenone) prescribed? - Not Applicable (EF >/= 4083%Cardiac Rehab Phase II ordered? - Yes      _____________  Discharge Vitals Blood pressure 121/66, pulse 76, temperature 97.7 F (36.5 C), resp. rate 17, height '5\' 5"'  (1.651 m), weight 52.2 kg, SpO2 100 %.  Filed Weights   10/15/20 0913  Weight: 52.2 kg    Labs & Radiologic Studies    CBC Recent Labs    10/16/20 0420  WBC 7.7  HGB 13.6  HCT 37.7  MCV 82.0  PLT 16419 Basic Metabolic Panel Recent Labs    10/16/20 0420  NA 136  K 4.1  CL 102  CO2 27  GLUCOSE 97  BUN 14  CREATININE 0.62  CALCIUM 9.1   Liver Function Tests No results for input(s): AST, ALT, ALKPHOS, BILITOT, PROT, ALBUMIN in the last 72 hours. No results for input(s): LIPASE, AMYLASE in the last 72 hours. High Sensitivity Troponin:   No results for input(s): TROPONINIHS in the last 720 hours.  BNP Invalid input(s): POCBNP D-Dimer No results for input(s): DDIMER in the last 72 hours. Hemoglobin A1C No results for input(s): HGBA1C in the last 72 hours. Fasting Lipid Panel No results for input(s): CHOL, HDL, LDLCALC, TRIG, CHOLHDL, LDLDIRECT in the last 72 hours. Thyroid Function Tests No results for input(s): TSH, T4TOTAL, T3FREE, THYROIDAB in the last 72 hours.  Invalid input(s): FREET3 _____________  CARDIAC  CATHETERIZATION  Result Date: 10/15/2020   Mid LAD lesion is 70% stenosed.   Prox LAD lesion is 80% stenosed.   A drug-eluting stent in the proximal and mid LAD was successfully placed using a STENT ONYX FRONTIER 2.5X38.   Post intervention, there is a 0% residual stenosis. Conclusion: Single-vessel coronary artery disease including 80% heavily calcified proximal LAD, and 70% mid LAD stenoses. Shockwave lithotripsy to the proximal LAD using a 2.5 mm shockwave balloon at 4 ATM for 40 pulses. Successful PCI to the proximal and mid LAD with placement of a 2.5 x 38 mm Onyx DES with excellent angiographic result and TIMI-3 flow. Recommendation: Dual antiplatelet therapy with aspirin and Plavix for 6 months, ideally longer. Ongoing aggressive secondary prevention Will observe overnight and discharge tomorrow.  2 hours bedrest. Mynx closure device used in the right femoral artery.   CARDIAC CATHETERIZATION  Result Date: 10/08/2020   Prox LAD lesion is 80% stenosed.   Mid LAD lesion is 70% stenosed.   LV end diastolic pressure is normal.   There is no aortic valve stenosis. Conclusion: Single vessel coronary artery  disease including 80% proximal LAD stenosis that is heavily calcified, and 70% mid LAD stenosis. Normal LVEDP = 13 mmHg Recommendation: Start patient on dual antiplatelet therapy with aspirin and plavix today. We will plan for PCI to proximal and mid LAD on 9/2 using shockwave lithotripsy. Small right radial artery which may make delivery of a guide catheter from that access site difficult. Ongoing aggressive secondary prevention.   CT CORONARY MORPH W/CTA COR W/SCORE W/CA W/CM &/OR WO/CM  Addendum Date: 09/16/2020   ADDENDUM REPORT: 09/16/2020 14:56 EXAM: OVER-READ INTERPRETATION  CT CHEST The following report is an over-read performed by radiologist Dr. Abigail Miyamoto of [facility], [place] on 09/16/2020. This over-read does not include interpretation of cardiac or coronary anatomy or pathology. The  coronary CTA interpretation by the cardiologist is attached. COMPARISON:  03/30/2020 chest radiograph. Calcium score CT 07/23/2019. FINDINGS: Vascular: Aortic atherosclerosis. Tortuous thoracic aorta. No central pulmonary embolism, on this non-dedicated study. Mediastinum/Nodes: No imaged thoracic adenopathy. Lungs/Pleura: No pleural fluid. 2 mm right upper lobe calcified granuloma. Upper Abdomen: Normal imaged portions of the liver, spleen, stomach. Musculoskeletal: Prior right-sided lumpectomy. Pectus excavatum deformity. IMPRESSION: No acute findings in the imaged extracardiac chest. Aortic Atherosclerosis (ICD10-I70.0). Electronically Signed   By: Abigail Miyamoto M.D.   On: 09/16/2020 14:56   Result Date: 09/16/2020 CLINICAL DATA:  Chest pain EXAM: Cardiac/Coronary  CTA TECHNIQUE: The patient was scanned on a Siemens Somatom go.Top scanner. : A retrospective scan was triggered in the descending thoracic aorta. Axial non-contrast 3 mm slices were carried out through the heart. The data set was analyzed on a dedicated work station and scored using the [place]. Gantry rotation speed was 330 msecs and collimation was .6 mm. 19m of iv metoprolol and 0.8 mg of sl NTG was given. The 3D data set was reconstructed in 5% intervals of the 60-95 % of the R-R cycle. Diastolic phases were analyzed on a dedicated work station using MPR, MIP and VRT modes. The patient received 75 cc of contrast. FINDINGS: Aorta: Normal size. Minimal aortic root calcifications. No dissection. Aortic Valve:  Trileaflet.  No calcifications. Coronary Arteries:  Normal coronary origin.  Right dominance. RCA is a dominant artery that gives rise to PDA and PLA. There is no plaque. Left main gives rise to LAD and LCX arteries. LAD has calcified plaque in the proximal segment causing severe stenosis (>70%). LCX is a non-dominant artery that gives rise to one large obtuse marginal branch. There is calcified plaque in the proximal OM1 causing mild  stenosis (25%). Other findings: Normal pulmonary vein drainage into the left atrium. Normal left atrial appendage without a thrombus. Normal size of the pulmonary artery. IMPRESSION: 1. Coronary calcium score of 352. This was 91st percentile for age and sex matched control. 2. Normal coronary origin with right dominance. 3. Calcified plaque causing severe stenosis in the proximal LAD. 4. Mild stenosis in first obtuse marginal branch. 5. CAD-RADS 4 Severe stenosis.  (70-99% or > 50% left main). 6. Cardiac catheterization or CT FFR is recommended. Consider symptom-guided anti-ischemic pharmacotherapy as well as risk factor modification per guideline directed care. Additional analysis with CT FFR will be submitted and reported separately. Electronically Signed: By: BKate SableM.D. On: 09/16/2020 14:10   CT CORONARY FRACTIONAL FLOW RESERVE FLUID ANALYSIS  Result Date: 09/17/2020 EXAM: CT FFR ANALYSIS CLINICAL DATA:  Significant stenosis in the LAD on CCTA FINDINGS: FFRct analysis was performed on the original cardiac CT angiogram dataset. Diagrammatic representation of the FFRct analysis  is provided in a separate PDF document in PACS. This dictation was created using the PDF document and an interactive 3D model of the results. 3D model is not available in the EMR/PACS. Normal FFR range is >0.80. 1. Left Main:  No significant stenosis. 2. LAD: significant stenosis in the mid LAD, FFRct 0.71. 3. LCX: No significant stenosis. 4. RCA: No significant stenosis. IMPRESSION: 1. CT FFR analysis showed significant stenosis in the mid LAD, FFRct 0.71 2.  Recommend cardiac catheterization. Electronically Signed   By: Kate Sable M.D.   On: 09/17/2020 11:27   Disposition   Pt is being discharged home today in good condition.  Please refer to MD attestation for physical exam.  Follow-up Plans & Appointments     Follow-up Information     Gollan, Kathlene November, MD Follow up.   Specialty: Cardiology Why:  Please contact the office if you do not receive a call within the next few days to schedule your follow-up with Dr. Rockey Situ or APP, which should occur within 1 to 2 weeks of discharge from the hospital. Contact information: [place] [address] [phone]                Discharge Instructions     AMB Referral to Cardiac Rehabilitation - Phase II   Complete by: As directed    Diagnosis:  Stable Angina Coronary Stents     After initial evaluation and assessments completed: Virtual Based Care may be provided alone or in conjunction with Phase 2 Cardiac Rehab based on patient barriers.: Yes   Call MD for:  difficulty breathing, headache or visual disturbances   Complete by: As directed    Call MD for:  extreme fatigue   Complete by: As directed    Call MD for:  hives   Complete by: As directed    Call MD for:  persistant dizziness or light-headedness   Complete by: As directed    Call MD for:  persistant nausea and vomiting   Complete by: As directed    Call MD for:  redness, tenderness, or signs of infection (pain, swelling, redness, odor or green/yellow discharge around incision site)   Complete by: As directed    Call MD for:  severe uncontrolled pain   Complete by: As directed    Call MD for:  temperature >100.4   Complete by: As directed    Diet - low sodium heart healthy   Complete by: As directed    Increase activity slowly   Complete by: As directed        Discharge Medications   Allergies as of 10/16/2020       Reactions   Penicillins Swelling   Reaction: Childhood   Erythromycin Nausea And Vomiting   Sulfa Antibiotics Nausea And Vomiting   Sulfasalazine Nausea And Vomiting        Medication List     STOP taking these medications    DIALYVITE VITAMIN D 5000 PO   ELDERBERRY PO       TAKE these medications    aspirin EC 81 MG tablet Take 1 tablet (81 mg total) by mouth daily. Swallow whole. What changed: when  to take this   Calcium 600-400 MG-UNIT Chew Chew 1 tablet by mouth in the morning.   clopidogrel 75 MG tablet Commonly known as: Plavix Take 1 tablet (75 mg total) by mouth daily. Notes to patient: Please avoid medicines like ibuprofen, Advil, Motrin, naproxen, and Aleve due  to risk of stomach bleeding. You may take Tylenol as directed or talk to your primary doctor about alternatives.   pantoprazole 20 MG tablet Commonly known as: PROTONIX Take 1 tablet (20 mg total) by mouth daily.   rosuvastatin 20 MG tablet Commonly known as: CRESTOR Take 1 tablet (20 mg total) by mouth in the morning. Start taking on: October 17, 2020 What changed:  medication strength how much to take           Outstanding Labs/Studies   Recommended labs at follow-up include lipids, c-Met, CBC.  Duration of Discharge Encounter   Greater than 30 minutes including physician time.  Signed, Arvil Chaco, PA-C 10/16/2020, 12:20 PM

## 2020-10-19 ENCOUNTER — Encounter: Payer: Self-pay | Admitting: Cardiovascular Disease

## 2020-10-22 ENCOUNTER — Telehealth: Payer: Self-pay | Admitting: Cardiovascular Disease

## 2020-10-22 NOTE — Telephone Encounter (Signed)
This is a Dr. Rockey Situ patient. Dr. [name] only performed her cardiac cath and pci. Medication management should be addressed by her primary cardiologist.

## 2020-10-22 NOTE — Telephone Encounter (Signed)
Patient calling  Would like to clarify medication instructions from hospital discharge paperwork States it says to stop medications and she would like to discuss why Please call

## 2020-10-22 NOTE — Telephone Encounter (Signed)
Was able to return call to Theresa Hale, noted on her recent AVS, advised to d/c Vit D & Elderberry supplements, pt concern as to why. Spoke with Theresa East, PA-C, who seen her during her recent admission, reported  "Unsure of what these medications have with cardiac risk and her current cardiac medications, advised to take at her own risk".   Pt reports okay with stopping the Vit-D, as her levels have improved, reports she has been taking the Elderberry since 2019 after her dx of breast cancer, stated "this vitamins has helped me tremendously from getting sick". Suggested to make her feel more at ease to continue taking, speak with her pharmacy the next time she goes to get another supply, to see what they advised as risk and benefits of Elderberry with her current medications and cardiac history.  Hx of  Unstable angina (HCC) Effort angina (HCC) Atrial flutter (HCC) Paroxysmal SVT (supraventricular tachycardia) (HCC) Hypertension Coronary artery disease  Medications  aspirin EC 81 MG tablet Calcium 600-400 MG-UNIT CHEW clopidogrel (PLAVIX) 75 MG tablet pantoprazole (PROTONIX) 20 MG tablet rosuvastatin (CRESTOR) 20 MG tablet  Pt is very grateful for the return call, she will more then likely continue her Theresa Hale as it has been beneficial for her since 2019, but will consult with pharmacy as well to be safe. Otherwise all questions or concerns were address and no additional concerns at this time. Agreeable to plan, will call back for anything further.

## 2020-10-25 NOTE — Progress Notes (Signed)
Pt's lab results mailed to pt, pt is not utilizing his MyChart account to see results   

## 2020-11-01 NOTE — Progress Notes (Signed)
Cardiology Office Note  Date:  11/02/2020   ID:  KARSIN MOLINERO, DOB March 06, 1955, MRN [id number]  PCP:  Caryl Bis, MD   Chief Complaint  Patient presents with   1 month follow up     s/p cardiac cath & stent placement. Medications reviewed by the patient verbally. "Doing well."     HPI:  [name] is a 65 year old woman with past medical history of Anxiety attacks Breast cancer Atrial flutter in 05/2016, 10/21 HTN CT coronary calcium score 262 Long history of paroxysmal tachycardia, symptomatic flutter and SVT Intolerant of rate and rhythm controlling medications secondary to hypotension Who presents for routine follow-up of her tachycardia, hx of atrial flutter, angina, CAD  Recently seen September 07, 2020 in the office Reported having some episodes of chest fullness/pressure concerning for angina  Cardiac CTA September 16, 2020 Documenting severe proximal to mid LAD stenosis, FFR of 0.7  Cardiac catheterization October 15, 2020 Mid LAD lesion is 70% stenosed.   Prox LAD lesion is 80% stenosed.   A drug-eluting stent in the proximal and mid LAD was successfully placed using a STENT ONYX FRONTIER [id number].  Currently on aspirin Plavix  Numbers reported from home, pressures [id number] to [id number] systolic   Declined b-blocker: labile pressure/bradycardia Prefers not to add medications at this time  Feels weak, fatigued, legs feel tired Will be open to cardiac rehab  EKG personally reviewed by myself on todays visit Shows normal sinus rhythm rate 74 bpm no significant ST-T wave changes  Other past medical history reviewed Followed by EP in [facility] followed for atrial flutter and SVT Underwent ablation December 26, 2019 episodes of Near syncope associated with low blood pressure, metoprolol held   ZIO monitor ordered by one of our providers Patient had a min HR of 49 bpm, max HR of 197 bpm, and avg HR of 67 bpm. Predominant underlying rhythm was Sinus Rhythm.   42  Supraventricular Tachycardia runs occurred, the run with the fastest interval lasting 6 beats with a max rate of 197 bpm, the longest lasting 11.3 secs with an avg rate of 127 bpm. Not patient triggered    Isolated SVEs were rare (<1.0%), SVE Couplets were rare (<1.0%), and SVE Triplets were rare (<1.0%). Isolated VEs were rare (<1.0%, 123), VE Couplets were rare (<1.0%,  5), and VE Triplets were rare (<1.0%, 1).   Patient triggered events associated with sinus tachycardia  Developed atrial flutter with RVR, 1:45 Am, seen in the clinic December 04, 2019 Was sent to the emergency room, underwent cardioversion 6:45 pm  Started on anticoagulation She has follow-up with EP December 16, 2019  She is only on Eliquis 2.5 twice daily (started by the emergency room) Given normal renal function, age less than 59, should be on 5 twice daily  Echo 2018  Left ventricle: The cavity size was normal. Wall thickness was    normal. Systolic function was normal. The estimated ejection    fraction was in the range of 60% to 65%. Wall motion was normal;    there were no regional wall motion abnormalities. There was an    increased relative contribution of atrial contraction to    ventricular filling. Left ventricular diastolic function    parameters were normal.  - Aortic valve: Mildly calcified annulus. Probably trileaflet.  - Mitral valve: Mildly calcified annulus.  - Tricuspid valve: There was mild regurgitation.  03/29/2019 Near syncope Went down on the ground, etiology unclear   PMH:  has a past medical history of Atrial flutter (Theresa Hale), Breast cancer (Theresa Hale), Elevated coronary artery calcium score, GERD (gastroesophageal reflux disease), History of echocardiogram, Personal history of radiation therapy (02/2018), PONV (postoperative nausea and vomiting), and PSVT (paroxysmal supraventricular tachycardia) (Theresa Hale).  PSH:    Past Surgical History:  Procedure Laterality Date   A-FLUTTER ABLATION N/A  12/26/2019   Procedure: A-FLUTTER ABLATION;  Surgeon: Constance Haw, MD;  Location: [facility];  Service: Cardiovascular;  Laterality: N/A;   ABDOMINAL HYSTERECTOMY     ABLATION     BREAST BIOPSY Right 10/02/2017   BREAST EXCISIONAL BIOPSY Right 01/2002   BREAST LUMPECTOMY Right 2019   BREAST LUMPECTOMY WITH RADIOACTIVE SEED AND SENTINEL LYMPH NODE BIOPSY Right 11/26/2017   Procedure: RIGHT BREAST LUMPECTOMY WITH SENTINEL NODE MAPPING AND TARGETED NODE DISECTION ERAS PATHWAY;  Surgeon: Jovita Kussmaul, MD;  Location: [place];  Service: General;  Laterality: Right;   CESAREAN SECTION     CHOLECYSTECTOMY     CORONARY STENT INTERVENTION N/A 10/15/2020   Procedure: CORONARY STENT INTERVENTION;  Surgeon: Wellington Hampshire, MD;  Location: [place];  Service: Cardiovascular;  Laterality: N/A;   KNEE ARTHROSCOPY     LEFT HEART CATH AND CORONARY ANGIOGRAPHY N/A 10/08/2020   Procedure: LEFT HEART CATH AND CORONARY ANGIOGRAPHY;  Surgeon: Wellington Hampshire, MD;  Location: [facility];  Service: Cardiovascular;  Laterality: N/A;   NECK SURGERY      Current Outpatient Medications  Medication Sig Dispense Refill   aspirin EC 81 MG tablet Take 1 tablet (81 mg total) by mouth daily. Swallow whole. (Patient taking differently: Take 81 mg by mouth every evening. Swallow whole.) 90 tablet 3   Calcium 600-400 MG-UNIT CHEW Chew 1 tablet by mouth in the morning.     clopidogrel (PLAVIX) 75 MG tablet Take 1 tablet (75 mg total) by mouth daily. 30 tablet 11   pantoprazole (PROTONIX) 20 MG tablet Take 1 tablet (20 mg total) by mouth daily. 30 tablet 3   rosuvastatin (CRESTOR) 20 MG tablet Take 1 tablet (20 mg total) by mouth in the morning. 30 tablet 3   No current facility-administered medications for this visit.    Allergies:   Penicillins, Erythromycin, Sulfa antibiotics, and Sulfasalazine   Social History:  The patient  reports that she has never smoked.  She has never used smokeless tobacco. She reports that she does not drink alcohol and does not use drugs.   Family History:   family history includes Alzheimer's disease in her father; Breast cancer (age of onset: 21) in her sister; Breast cancer (age of onset: 23) in her mother; Parkinson's disease in her father; Stroke in her mother.    Review of Systems: Review of Systems  Constitutional: Negative.   HENT: Negative.    Respiratory:  Positive for shortness of breath.   Cardiovascular:  Positive for chest pain.  Gastrointestinal: Negative.   Musculoskeletal: Negative.   Psychiatric/Behavioral: Negative.    All other systems reviewed and are negative.  PHYSICAL EXAM: VS:  BP (!) 142/80 (BP Location: Left Arm, Patient Position: Sitting, Cuff Size: Normal)   Pulse 74   Ht '5\' 5"'$  (1.651 m)   Wt 118 lb (53.5 kg)   SpO2 98%   BMI 19.64 kg/m  , BMI Body mass index is 19.64 kg/m. Constitutional:  oriented to person, place, and time. No distress.  HENT:  Head: Grossly normal Eyes:  no discharge. No scleral icterus.  Neck: No JVD, no carotid bruits  Cardiovascular: Regular rate and rhythm, no murmurs appreciated Pulmonary/Chest: Clear to auscultation bilaterally, no wheezes or rails Abdominal: Soft.  no distension.  no tenderness.  Musculoskeletal: Normal range of motion Neurological:  normal muscle tone. Coordination normal. No atrophy Skin: Skin warm and dry Psychiatric: normal affect, pleasant  Recent Labs: 12/04/2019: ALT 28; B Natriuretic Peptide 385.8; Magnesium 2.1; TSH 3.740 10/16/2020: BUN 14; Creatinine, Ser 0.62; Hemoglobin 13.6; Platelets 168; Potassium 4.1; Sodium 136    Lipid Panel No results found for: CHOL, HDL, LDLCALC, TRIG    Wt Readings from Last 3 Encounters:  11/02/20 118 lb (53.5 kg)  10/15/20 115 lb 1.3 oz (52.2 kg)  10/08/20 115 lb (52.2 kg)     ASSESSMENT AND PLAN:  Problem List Items Addressed This Visit       Cardiology Problems   Coronary  artery disease - Primary   Other Visit Diagnoses     SVT (supraventricular tachycardia) (HCC)       Paroxysmal atrial flutter (HCC)       Benign essential HTN       Coronary artery calcification seen on CT scan       Mixed hyperlipidemia         Atrial flutter Underwent ablation with EP Off beta-blocker secondary to hypotension  SVT Noted on monitor Difficulty tolerating beta-blocker secondary to low blood pressure Followed by EP  Coronary artery disease with stable angina Recent stent placement to her LAD, on aspirin Plavix Referral made to cardiac rehab  Dizziness Improved by holding metoprolol  Hyperlipidemia Crestor 20, goal LDL less than 70  Headache Prior history Follow-up with primary care    total encounter time more than 25 minutes  Greater than 50% was spent in counseling and coordination of care with the patient    Signed, Esmond Plants, M.D., Ph.D. [place], [place]

## 2020-11-02 ENCOUNTER — Other Ambulatory Visit: Payer: Self-pay

## 2020-11-02 ENCOUNTER — Ambulatory Visit: Payer: Medicare PPO | Admitting: Cardiovascular Disease

## 2020-11-02 ENCOUNTER — Encounter: Payer: Self-pay | Admitting: Cardiovascular Disease

## 2020-11-02 VITALS — BP 142/80 | HR 74 | Ht 65.0 in | Wt 118.0 lb

## 2020-11-02 DIAGNOSIS — I25118 Atherosclerotic heart disease of native coronary artery with other forms of angina pectoris: Secondary | ICD-10-CM

## 2020-11-02 DIAGNOSIS — I4892 Unspecified atrial flutter: Secondary | ICD-10-CM

## 2020-11-02 DIAGNOSIS — I208 Other forms of angina pectoris: Secondary | ICD-10-CM | POA: Diagnosis not present

## 2020-11-02 DIAGNOSIS — I471 Supraventricular tachycardia: Secondary | ICD-10-CM

## 2020-11-02 DIAGNOSIS — Z955 Presence of coronary angioplasty implant and graft: Secondary | ICD-10-CM | POA: Diagnosis not present

## 2020-11-02 DIAGNOSIS — I10 Essential (primary) hypertension: Secondary | ICD-10-CM

## 2020-11-02 DIAGNOSIS — E782 Mixed hyperlipidemia: Secondary | ICD-10-CM | POA: Diagnosis not present

## 2020-11-02 DIAGNOSIS — I2510 Atherosclerotic heart disease of native coronary artery without angina pectoris: Secondary | ICD-10-CM | POA: Diagnosis not present

## 2020-11-02 NOTE — Patient Instructions (Addendum)
Cardiac rehab referral was placed, they should call in 1-2 weeks to get your first appointment.   Medication Instructions:  No changes  If you need a refill on your cardiac medications before your next appointment, please call your pharmacy.   Lab work: No new labs needed  Testing/Procedures: No new testing needed  Follow-Up: At [facility], you and your health needs are our priority.  As part of our continuing mission to provide you with exceptional heart care, we have created designated Provider Care Teams.  These Care Teams include your primary Cardiologist (physician) and Advanced Practice Providers (APPs -  Physician Assistants and Nurse Practitioners) who all work together to provide you with the care you need, when you need it.  You will need a follow up appointment in 6 months  Providers on your designated Care Team:   Murray Hodgkins, NP Christell Faith, PA-C Marrianne Mood, PA-C Cadence Juncos, [place]  COVID-19 Vaccine Information can be found at: [email or web address].co.uk For questions related to vaccine distribution or appointments, please email vaccine@[email or web address] .[email or web address] or call [phone].

## 2020-11-05 ENCOUNTER — Ambulatory Visit: Payer: Medicare PPO | Admitting: Nurse Practitioner

## 2020-11-10 ENCOUNTER — Telehealth: Payer: Self-pay | Admitting: Cardiovascular Disease

## 2020-11-10 ENCOUNTER — Encounter: Payer: Medicare PPO | Attending: Cardiovascular Disease | Admitting: *Deleted

## 2020-11-10 ENCOUNTER — Other Ambulatory Visit: Payer: Self-pay

## 2020-11-10 DIAGNOSIS — I208 Other forms of angina pectoris: Secondary | ICD-10-CM | POA: Insufficient documentation

## 2020-11-10 DIAGNOSIS — Z48812 Encounter for surgical aftercare following surgery on the circulatory system: Secondary | ICD-10-CM | POA: Insufficient documentation

## 2020-11-10 DIAGNOSIS — Z955 Presence of coronary angioplasty implant and graft: Secondary | ICD-10-CM | POA: Insufficient documentation

## 2020-11-10 MED ORDER — PANTOPRAZOLE SODIUM 20 MG PO TBEC: 20.0000 mg | DELAYED_RELEASE_TABLET | Freq: Every day | ORAL | 2 refills | Status: DC

## 2020-11-10 MED ORDER — ROSUVASTATIN CALCIUM 20 MG PO TABS: 20.0000 mg | ORAL_TABLET | Freq: Every morning | ORAL | 2 refills | Status: DC

## 2020-11-10 MED ORDER — CLOPIDOGREL BISULFATE 75 MG PO TABS: 75.0000 mg | ORAL_TABLET | Freq: Every day | ORAL | 2 refills | Status: DC

## 2020-11-10 NOTE — Progress Notes (Signed)
Initial telephone orientation completed. Diagnosis can be found in [facility] 9/2. EP orientation scheduled for Thursday 9/29 at 9:30

## 2020-11-10 NOTE — Telephone Encounter (Signed)
Requested Prescriptions   Signed Prescriptions Disp Refills   clopidogrel (PLAVIX) 75 MG tablet 90 tablet 2    Sig: Take 1 tablet (75 mg total) by mouth daily.    Authorizing Provider: Minna Merritts    Ordering User: Othelia Pulling C   pantoprazole (PROTONIX) 20 MG tablet 90 tablet 2    Sig: Take 1 tablet (20 mg total) by mouth daily.    Authorizing Provider: Minna Merritts    Ordering User: Othelia Pulling C   rosuvastatin (CRESTOR) 20 MG tablet 90 tablet 2    Sig: Take 1 tablet (20 mg total) by mouth in the morning.    Authorizing Provider: Minna Merritts    Ordering User: Britt Bottom

## 2020-11-10 NOTE — Telephone Encounter (Signed)
*  STAT* If patient is at the pharmacy, call can be transferred to refill team.   1. Which medications need to be refilled? (please list name of each medication and dose if known)   Clopidogrel 75 mg po q d  Pantoprazole 20 mg po q d  Rosuvastatin 20 mg po q d    2. Which pharmacy/location (including street and city if local pharmacy) is medication to be sent to? [facility] [address] [place]   3. Do they need a 30 day or 90 day supply? [place]

## 2020-11-11 ENCOUNTER — Encounter: Payer: Medicare PPO | Admitting: *Deleted

## 2020-11-11 ENCOUNTER — Ambulatory Visit: Payer: Medicare PPO | Admitting: Cardiology

## 2020-11-11 VITALS — Ht 65.1 in | Wt 119.6 lb

## 2020-11-11 DIAGNOSIS — Z48812 Encounter for surgical aftercare following surgery on the circulatory system: Secondary | ICD-10-CM | POA: Diagnosis not present

## 2020-11-11 DIAGNOSIS — Z955 Presence of coronary angioplasty implant and graft: Secondary | ICD-10-CM

## 2020-11-11 DIAGNOSIS — I208 Other forms of angina pectoris: Secondary | ICD-10-CM | POA: Diagnosis not present

## 2020-11-11 NOTE — Patient Instructions (Signed)
Patient Instructions  Patient Details  Name: Theresa Hale MRN: [id number] Date of Birth: 09-14-1955 Referring Provider:  Minna Merritts, MD  Below are your personal goals for exercise, nutrition, and risk factors. Our goal is to help you stay on track towards obtaining and maintaining these goals. We will be discussing your progress on these goals with you throughout the program.  Initial Exercise Prescription:  Initial Exercise Prescription - 11/11/20 1100       Date of Initial Exercise RX and Referring Provider   Date 11/11/20    Referring Provider Kathlyn Sacramento MD   Primary Cardiologist: Dr. Ida Rogue     Oxygen   Maintain Oxygen Saturation 88% or higher      Treadmill   MPH 2.5    Grade 0.5    Minutes 15    METs 3.09      NuStep   Level 3    SPM 80    Minutes 15    METs 3      Elliptical   Level 1    Speed 3.6    Minutes 15    METs 3      Prescription Details   Frequency (times per week) 2    Duration Progress to 30 minutes of continuous aerobic without signs/symptoms of physical distress      Intensity   THRR 40-80% of Max Heartrate 103-136    Ratings of Perceived Exertion 11-13    Perceived Dyspnea 0-4      Progression   Progression Continue to progress workloads to maintain intensity without signs/symptoms of physical distress.      Resistance Training   Training Prescription Yes    Weight 3 lb    Reps 10-15             Exercise Goals: Frequency: Be able to perform aerobic exercise two to three times per week in program working toward 2-5 days per week of home exercise.  Intensity: Work with a perceived exertion of 11 (fairly light) - 15 (hard) while following your exercise prescription.  We will make changes to your prescription with you as you progress through the program.   Duration: Be able to do 30 to 45 minutes of continuous aerobic exercise in addition to a 5 minute warm-up and a 5 minute cool-down routine.   Nutrition  Goals: Your personal nutrition goals will be established when you do your nutrition analysis with the dietician.  The following are general nutrition guidelines to follow: Cholesterol < 200mg /day Sodium < 1500mg /day Fiber: Women over 50 yrs - 21 grams per day  Personal Goals:  Personal Goals and Risk Factors at Admission - 11/11/20 1158       Core Components/Risk Factors/Patient Goals on Admission    Weight Management Weight Maintenance;Yes    Intervention Weight Management: Develop a combined nutrition and exercise program designed to reach desired caloric intake, while maintaining appropriate intake of nutrient and fiber, sodium and fats, and appropriate energy expenditure required for the weight goal.;Weight Management: Provide education and appropriate resources to help participant work on and attain dietary goals.    Admit Weight 119 lb 9.6 oz (54.3 kg)    Goal Weight: Short Term 119 lb (54 kg)    Goal Weight: Long Term 119 lb (54 kg)    Expected Outcomes Weight Maintenance: Understanding of the daily nutrition guidelines, which includes 25-35% calories from fat, 7% or less cal from saturated fats, less than 200mg  cholesterol, less than 1.5gm of sodium, &  5 or more servings of fruits and vegetables daily;Short Term: Continue to assess and modify interventions until short term weight is achieved;Long Term: Adherence to nutrition and physical activity/exercise program aimed toward attainment of established weight goal    Hypertension Yes    Intervention Provide education on lifestyle modifcations including regular physical activity/exercise, weight management, moderate sodium restriction and increased consumption of fresh fruit, vegetables, and low fat dairy, alcohol moderation, and smoking cessation.;Monitor prescription use compliance.    Expected Outcomes Short Term: Continued assessment and intervention until BP is < 140/16mm HG in hypertensive participants. < 130/34mm HG in hypertensive  participants with diabetes, heart failure or chronic kidney disease.;Long Term: Maintenance of blood pressure at goal levels.             Tobacco Use Initial Evaluation: Social History   Tobacco Use  Smoking Status Never  Smokeless Tobacco Never    Exercise Goals and Review:  Exercise Goals     Row Name 11/11/20 1157             Exercise Goals   Increase Physical Activity Yes       Intervention Provide advice, education, support and counseling about physical activity/exercise needs.;Develop an individualized exercise prescription for aerobic and resistive training based on initial evaluation findings, risk stratification, comorbidities and participant's personal goals.       Expected Outcomes Short Term: Attend rehab on a regular basis to increase amount of physical activity.;Long Term: Add in home exercise to make exercise part of routine and to increase amount of physical activity.;Long Term: Exercising regularly at least 3-5 days a week.       Increase Strength and Stamina Yes       Intervention Provide advice, education, support and counseling about physical activity/exercise needs.;Develop an individualized exercise prescription for aerobic and resistive training based on initial evaluation findings, risk stratification, comorbidities and participant's personal goals.       Expected Outcomes Short Term: Increase workloads from initial exercise prescription for resistance, speed, and METs.;Short Term: Perform resistance training exercises routinely during rehab and add in resistance training at home;Long Term: Improve cardiorespiratory fitness, muscular endurance and strength as measured by increased METs and functional capacity (6MWT)       Able to understand and use rate of perceived exertion (RPE) scale Yes       Intervention Provide education and explanation on how to use RPE scale       Expected Outcomes Short Term: Able to use RPE daily in rehab to express subjective  intensity level;Long Term:  Able to use RPE to guide intensity level when exercising independently       Able to understand and use Dyspnea scale Yes       Intervention Provide education and explanation on how to use Dyspnea scale       Expected Outcomes Short Term: Able to use Dyspnea scale daily in rehab to express subjective sense of shortness of breath during exertion;Long Term: Able to use Dyspnea scale to guide intensity level when exercising independently       Knowledge and understanding of Target Heart Rate Range (THRR) Yes       Intervention Provide education and explanation of THRR including how the numbers were predicted and where they are located for reference       Expected Outcomes Short Term: Able to state/look up THRR;Long Term: Able to use THRR to govern intensity when exercising independently;Short Term: Able to use daily as guideline for intensity in  rehab       Able to check pulse independently Yes       Intervention Provide education and demonstration on how to check pulse in carotid and radial arteries.;Review the importance of being able to check your own pulse for safety during independent exercise       Expected Outcomes Short Term: Able to explain why pulse checking is important during independent exercise;Long Term: Able to check pulse independently and accurately       Understanding of Exercise Prescription Yes       Intervention Provide education, explanation, and written materials on patient's individual exercise prescription       Expected Outcomes Short Term: Able to explain program exercise prescription;Long Term: Able to explain home exercise prescription to exercise independently                Copy of goals given to participant.

## 2020-11-11 NOTE — Progress Notes (Signed)
Cardiac Individual Treatment Plan  Patient Details  Name: Theresa Hale MRN: [id number] Date of Birth: 01-23-1956 Referring Provider:   Flowsheet Row Cardiac Rehab from 11/11/2020 in [facility] Cardiac and Pulmonary Rehab  Referring Provider Kathlyn Sacramento MD  [facility] Cardiologist: Dr. Ida Rogue       Initial Encounter Date:  Flowsheet Row Cardiac Rehab from 11/11/2020 in [facility] Cardiac and Pulmonary Rehab  Date 11/11/20       Visit Diagnosis: Status post coronary artery stent placement  Patient's Home Medications on Admission:  Current Outpatient Medications:    aspirin EC 81 MG tablet, Take 1 tablet (81 mg total) by mouth daily. Swallow whole. (Patient taking differently: Take 81 mg by mouth every evening. Swallow whole.), Disp: 90 tablet, Rfl: 3   Calcium 600-400 MG-UNIT CHEW, Chew 1 tablet by mouth in the morning., Disp: , Rfl:    clopidogrel (PLAVIX) 75 MG tablet, Take 1 tablet (75 mg total) by mouth daily., Disp: 90 tablet, Rfl: 2   pantoprazole (PROTONIX) 20 MG tablet, Take 1 tablet (20 mg total) by mouth daily., Disp: 90 tablet, Rfl: 2   rosuvastatin (CRESTOR) 20 MG tablet, Take 1 tablet (20 mg total) by mouth in the morning., Disp: 90 tablet, Rfl: 2  Past Medical History: Past Medical History:  Diagnosis Date   Atrial flutter ([place])    a. Dx in 2018. Initially only a single episode. (CHA2DS2VASc = 2); b. 11/2020 Recurrent Aflutter s/p DCCV. Eliquis started; c. 12/2020 s/p RFCA; d. 01/2021 Eliquis d/c'd.   Breast cancer (HCC)    Elevated coronary artery calcium score    a. 07/2019 Cardiac CT: Ca2+ 262 (90th %'ile).   GERD (gastroesophageal reflux disease)    History of echocardiogram    a. 06/2016 Echo: EF 60-65%, no rwma. Mild TR.   Personal history of radiation therapy 02/2018   PONV (postoperative nausea and vomiting)    PSVT (paroxysmal supraventricular tachycardia) ([place])    a. 06/2019 Zio: RSR, avg HR 71, max HR 222. 97 runs of SVT, fastest 222 (4 beats), longest 1:05  (164).    Tobacco Use: Social History   Tobacco Use  Smoking Status Never  Smokeless Tobacco Never    Labs: Recent Review Flowsheet Data   There is no flowsheet data to display.      Exercise Target Goals: Exercise Program Goal: Individual exercise prescription set using results from initial 6 min walk test and THRR while considering  patient's activity barriers and safety.   Exercise Prescription Goal: Initial exercise prescription builds to 30-45 minutes a day of aerobic activity, 2-3 days per week.  Home exercise guidelines will be given to patient during program as part of exercise prescription that the participant will acknowledge.   Education: Aerobic Exercise: - Group verbal and visual presentation on the components of exercise prescription. Introduces F.I.T.T principle from ACSM for exercise prescriptions.  Reviews F.I.T.T. principles of aerobic exercise including progression. Written material given at graduation.   Education: Resistance Exercise: - Group verbal and visual presentation on the components of exercise prescription. Introduces F.I.T.T principle from ACSM for exercise prescriptions  Reviews F.I.T.T. principles of resistance exercise including progression. Written material given at graduation.    Education: Exercise & Equipment Safety: - Individual verbal instruction and demonstration of equipment use and safety with use of the equipment. Flowsheet Row Cardiac Rehab from 11/11/2020 in [facility] Cardiac and Pulmonary Rehab  Date 11/11/20  Educator [facility]  Instruction Review Code 1- Verbalizes Understanding  Education: Exercise Physiology & General Exercise Guidelines: - Group verbal and written instruction with models to review the exercise physiology of the cardiovascular system and associated critical values. Provides general exercise guidelines with specific guidelines to those with heart or lung disease.  Flowsheet Row Cardiac Rehab from 11/11/2020 in  [facility] Cardiac and Pulmonary Rehab  Education need identified 11/10/20       Education: Flexibility, Balance, Mind/Body Relaxation: - Group verbal and visual presentation with interactive activity on the components of exercise prescription. Introduces F.I.T.T principle from ACSM for exercise prescriptions. Reviews F.I.T.T. principles of flexibility and balance exercise training including progression. Also discusses the mind body connection.  Reviews various relaxation techniques to help reduce and manage stress (i.e. Deep breathing, progressive muscle relaxation, and visualization). Balance handout provided to take home. Written material given at graduation.   Activity Barriers & Risk Stratification:  Activity Barriers & Cardiac Risk Stratification - 11/11/20 1155       Activity Barriers & Cardiac Risk Stratification   Activity Barriers Joint Problems;Other (comment);Deconditioning;Muscular Weakness    Comments Breast CA '19 (removed 2 lymph nodes rt arm that can cause pain randomly), anerysm pulls some in leg    Cardiac Risk Stratification High             6 Minute Walk:  6 Minute Walk     Row Name 11/11/20 1155         6 Minute Walk   Phase Initial     Distance 1340 feet     Walk Time 6 minutes     # of Rest Breaks 0     MPH 2.53     METS 3.77     RPE 9     VO2 Peak 13.2     Symptoms Yes (comment)     Comments leg anerysm pulls some with walking     Resting HR 69 bpm     Resting BP 146/70     Resting Oxygen Saturation  100 %     Exercise Oxygen Saturation  during 6 min walk 99 %     Max Ex. HR 109 bpm     Max Ex. BP 152/74     2 Minute Post BP 120/66              Oxygen Initial Assessment:   Oxygen Re-Evaluation:   Oxygen Discharge (Final Oxygen Re-Evaluation):   Initial Exercise Prescription:  Initial Exercise Prescription - 11/11/20 1100       Date of Initial Exercise RX and Referring Provider   Date 11/11/20    Referring Provider Kathlyn Sacramento MD   Primary Cardiologist: Dr. Ida Rogue     Oxygen   Maintain Oxygen Saturation 88% or higher      Treadmill   MPH 2.5    Grade 0.5    Minutes 15    METs 3.09      NuStep   Level 3    SPM 80    Minutes 15    METs 3      Elliptical   Level 1    Speed 3.6    Minutes 15    METs 3      Prescription Details   Frequency (times per week) 2    Duration Progress to 30 minutes of continuous aerobic without signs/symptoms of physical distress      Intensity   THRR 40-80% of Max Heartrate 103-136    Ratings of Perceived Exertion 11-13    Perceived  Dyspnea 0-4      Progression   Progression Continue to progress workloads to maintain intensity without signs/symptoms of physical distress.      Resistance Training   Training Prescription Yes    Weight 3 lb    Reps 10-15             Perform Capillary Blood Glucose checks as needed.  Exercise Prescription Changes:   Exercise Prescription Changes     Row Name 11/11/20 1100             Response to Exercise   Blood Pressure (Admit) 146/70       Blood Pressure (Exercise) 152/74       Blood Pressure (Exit) 120/66       Heart Rate (Admit) 69 bpm       Heart Rate (Exercise) 109 bpm       Heart Rate (Exit) 80 bpm       Oxygen Saturation (Admit) 100 %       Oxygen Saturation (Exercise) 99 %       Rating of Perceived Exertion (Exercise) 9       Symptoms leg anerysm pulls some       Comments walk test results                Exercise Comments:   Exercise Goals and Review:   Exercise Goals     Row Name 11/11/20 1157             Exercise Goals   Increase Physical Activity Yes       Intervention Provide advice, education, support and counseling about physical activity/exercise needs.;Develop an individualized exercise prescription for aerobic and resistive training based on initial evaluation findings, risk stratification, comorbidities and participant's personal goals.       Expected  Outcomes Short Term: Attend rehab on a regular basis to increase amount of physical activity.;Long Term: Add in home exercise to make exercise part of routine and to increase amount of physical activity.;Long Term: Exercising regularly at least 3-5 days a week.       Increase Strength and Stamina Yes       Intervention Provide advice, education, support and counseling about physical activity/exercise needs.;Develop an individualized exercise prescription for aerobic and resistive training based on initial evaluation findings, risk stratification, comorbidities and participant's personal goals.       Expected Outcomes Short Term: Increase workloads from initial exercise prescription for resistance, speed, and METs.;Short Term: Perform resistance training exercises routinely during rehab and add in resistance training at home;Long Term: Improve cardiorespiratory fitness, muscular endurance and strength as measured by increased METs and functional capacity (6MWT)       Able to understand and use rate of perceived exertion (RPE) scale Yes       Intervention Provide education and explanation on how to use RPE scale       Expected Outcomes Short Term: Able to use RPE daily in rehab to express subjective intensity level;Long Term:  Able to use RPE to guide intensity level when exercising independently       Able to understand and use Dyspnea scale Yes       Intervention Provide education and explanation on how to use Dyspnea scale       Expected Outcomes Short Term: Able to use Dyspnea scale daily in rehab to express subjective sense of shortness of breath during exertion;Long Term: Able to use Dyspnea scale to guide intensity level when exercising independently  Knowledge and understanding of Target Heart Rate Range (THRR) Yes       Intervention Provide education and explanation of THRR including how the numbers were predicted and where they are located for reference       Expected Outcomes Short Term:  Able to state/look up THRR;Long Term: Able to use THRR to govern intensity when exercising independently;Short Term: Able to use daily as guideline for intensity in rehab       Able to check pulse independently Yes       Intervention Provide education and demonstration on how to check pulse in carotid and radial arteries.;Review the importance of being able to check your own pulse for safety during independent exercise       Expected Outcomes Short Term: Able to explain why pulse checking is important during independent exercise;Long Term: Able to check pulse independently and accurately       Understanding of Exercise Prescription Yes       Intervention Provide education, explanation, and written materials on patient's individual exercise prescription       Expected Outcomes Short Term: Able to explain program exercise prescription;Long Term: Able to explain home exercise prescription to exercise independently                Exercise Goals Re-Evaluation :   Discharge Exercise Prescription (Final Exercise Prescription Changes):  Exercise Prescription Changes - 11/11/20 1100       Response to Exercise   Blood Pressure (Admit) 146/70    Blood Pressure (Exercise) 152/74    Blood Pressure (Exit) 120/66    Heart Rate (Admit) 69 bpm    Heart Rate (Exercise) 109 bpm    Heart Rate (Exit) 80 bpm    Oxygen Saturation (Admit) 100 %    Oxygen Saturation (Exercise) 99 %    Rating of Perceived Exertion (Exercise) 9    Symptoms leg anerysm pulls some    Comments walk test results             Nutrition:  Target Goals: Understanding of nutrition guidelines, daily intake of sodium 1500mg , cholesterol 200mg , calories 30% from fat and 7% or less from saturated fats, daily to have 5 or more servings of fruits and vegetables.  Education: All About Nutrition: -Group instruction provided by verbal, written material, interactive activities, discussions, models, and posters to present general  guidelines for heart healthy nutrition including fat, fiber, MyPlate, the role of sodium in heart healthy nutrition, utilization of the nutrition label, and utilization of this knowledge for meal planning. Follow up email sent as well. Written material given at graduation. Flowsheet Row Cardiac Rehab from 11/11/2020 in [facility] Cardiac and Pulmonary Rehab  Education need identified 11/10/20       Biometrics:  Pre Biometrics - 11/11/20 1158       Pre Biometrics   Height 5' 5.1" (1.654 m)    Weight 119 lb 9.6 oz (54.3 kg)    BMI (Calculated) 19.83    Single Leg Stand 28.3 seconds              Nutrition Therapy Plan and Nutrition Goals:  Nutrition Therapy & Goals - 11/11/20 1158       Intervention Plan   Intervention Prescribe, educate and counsel regarding individualized specific dietary modifications aiming towards targeted core components such as weight, hypertension, lipid management, diabetes, heart failure and other comorbidities.    Expected Outcomes Short Term Goal: Understand basic principles of dietary content, such as calories, fat, sodium, cholesterol and  nutrients.;Short Term Goal: A plan has been developed with personal nutrition goals set during dietitian appointment.;Long Term Goal: Adherence to prescribed nutrition plan.             Nutrition Assessments:  MEDIFICTS Score Key: ?70 Need to make dietary changes  40-70 Heart Healthy Diet ? 40 Therapeutic Level Cholesterol Diet  Flowsheet Row Cardiac Rehab from 11/10/2020 in [facility] Cardiac and Pulmonary Rehab  Picture Your Plate Total Score on Admission 54      Picture Your Plate Scores: <16 Unhealthy dietary pattern with much room for improvement. 41-50 Dietary pattern unlikely to meet recommendations for good health and room for improvement. 51-60 More healthful dietary pattern, with some room for improvement.  >60 Healthy dietary pattern, although there may be some specific behaviors that could be improved.     Nutrition Goals Re-Evaluation:   Nutrition Goals Discharge (Final Nutrition Goals Re-Evaluation):   Psychosocial: Target Goals: Acknowledge presence or absence of significant depression and/or stress, maximize coping skills, provide positive support system. Participant is able to verbalize types and ability to use techniques and skills needed for reducing stress and depression.   Education: Stress, Anxiety, and Depression - Group verbal and visual presentation to define topics covered.  Reviews how body is impacted by stress, anxiety, and depression.  Also discusses healthy ways to reduce stress and to treat/manage anxiety and depression.  Written material given at graduation.   Education: Sleep Hygiene -Provides group verbal and written instruction about how sleep can affect your health.  Define sleep hygiene, discuss sleep cycles and impact of sleep habits. Review good sleep hygiene tips.    Initial Review & Psychosocial Screening:  Initial Psych Review & Screening - 11/10/20 1109       Initial Review   Current issues with Current Sleep Concerns      Family Dynamics   Good Support System? Yes   husband     Barriers   Psychosocial barriers to participate in program There are no identifiable barriers or psychosocial needs.;The patient should benefit from training in stress management and relaxation.      Screening Interventions   Interventions Encouraged to exercise;To provide support and resources with identified psychosocial needs;Provide feedback about the scores to participant    Expected Outcomes Short Term goal: Utilizing psychosocial counselor, staff and physician to assist with identification of specific Stressors or current issues interfering with healing process. Setting desired goal for each stressor or current issue identified.;Long Term Goal: Stressors or current issues are controlled or eliminated.;Short Term goal: Identification and review with participant of any  Quality of Life or Depression concerns found by scoring the questionnaire.;Long Term goal: The participant improves quality of Life and PHQ9 Scores as seen by post scores and/or verbalization of changes             Quality of Life Scores:   Quality of Life - 11/10/20 1130       Quality of Life   Select Quality of Life      Quality of Life Scores   Health/Function Pre 20.73 %    Socioeconomic Pre 25.63 %    Psych/Spiritual Pre 28.43 %    Family Pre 28.5 %    GLOBAL Pre 24.5 %            Scores of 19 and below usually indicate a poorer quality of life in these areas.  A difference of  2-3 points is a clinically meaningful difference.  A difference of 2-3 points in  the total score of the Quality of Life Index has been associated with significant improvement in overall quality of life, self-image, physical symptoms, and general health in studies assessing change in quality of life.  PHQ-9: Recent Review Flowsheet Data     Depression screen [facility] 2/9 11/11/2020   Decreased Interest 0   Down, Depressed, Hopeless 0   PHQ - 2 Score 0   Altered sleeping 0   Tired, decreased energy 1   Change in appetite 0   Feeling bad or failure about yourself  0   Trouble concentrating 0   Moving slowly or fidgety/restless 0   Suicidal thoughts 0   PHQ-9 Score 1   Difficult doing work/chores Not difficult at all      Interpretation of Total Score  Total Score Depression Severity:  1-4 = Minimal depression, 5-9 = Mild depression, 10-14 = Moderate depression, 15-19 = Moderately severe depression, 20-27 = Severe depression   Psychosocial Evaluation and Intervention:  Psychosocial Evaluation - 11/10/20 1125       Psychosocial Evaluation & Interventions   Interventions Encouraged to exercise with the program and follow exercise prescription    Comments Lovey Newcomer is coming to cardiac rehab for a stent. She does have a history of anxiety but states it isn't an issue currently. She is taking her  medications as she should and states her blood pressure has gotten better after the stent placement. Her sleep concern is related to her bad dreams that she has been struggling with for a while now. One dream was so bad that it put her into aflutter and she had to get an ablation. She has tried tracking her food intake and watching what she does right before bed with no improvement. She is wanting to get into the program to receive education and feel more confident in managing her heart healthy lifestyle    Expected Outcomes Short: attend cardiac rehab for education and exercise. Long: develop and maintain positive self care habits.    Continue Psychosocial Services  Follow up required by staff             Psychosocial Re-Evaluation:   Psychosocial Discharge (Final Psychosocial Re-Evaluation):   Vocational Rehabilitation: Provide vocational rehab assistance to qualifying candidates.   Vocational Rehab Evaluation & Intervention:  Vocational Rehab - 11/10/20 1107       Initial Vocational Rehab Evaluation & Intervention   Assessment shows need for Vocational Rehabilitation No             Education: Education Goals: Education classes will be provided on a variety of topics geared toward better understanding of heart health and risk factor modification. Participant will state understanding/return demonstration of topics presented as noted by education test scores.  Learning Barriers/Preferences:  Learning Barriers/Preferences - 11/10/20 1125       Learning Barriers/Preferences   Learning Barriers None    Learning Preferences None             General Cardiac Education Topics:  AED/CPR: - Group verbal and written instruction with the use of models to demonstrate the basic use of the AED with the basic ABC's of resuscitation.   Anatomy and Cardiac Procedures: - Group verbal and visual presentation and models provide information about basic cardiac anatomy and function.  Reviews the testing methods done to diagnose heart disease and the outcomes of the test results. Describes the treatment choices: Medical Management, Angioplasty, or Coronary Bypass Surgery for treating various heart conditions including Myocardial Infarction, Angina, Valve  Disease, and Cardiac Arrhythmias.  Written material given at graduation. Flowsheet Row Cardiac Rehab from 11/11/2020 in [facility] Cardiac and Pulmonary Rehab  Education need identified 11/10/20       Medication Safety: - Group verbal and visual instruction to review commonly prescribed medications for heart and lung disease. Reviews the medication, class of the drug, and side effects. Includes the steps to properly store meds and maintain the prescription regimen.  Written material given at graduation.   Intimacy: - Group verbal instruction through game format to discuss how heart and lung disease can affect sexual intimacy. Written material given at graduation..   Know Your Numbers and Heart Failure: - Group verbal and visual instruction to discuss disease risk factors for cardiac and pulmonary disease and treatment options.  Reviews associated critical values for Overweight/Obesity, Hypertension, Cholesterol, and Diabetes.  Discusses basics of heart failure: signs/symptoms and treatments.  Introduces Heart Failure Zone chart for action plan for heart failure.  Written material given at graduation.   Infection Prevention: - Provides verbal and written material to individual with discussion of infection control including proper hand washing and proper equipment cleaning during exercise session. Flowsheet Row Cardiac Rehab from 11/11/2020 in [facility] Cardiac and Pulmonary Rehab  Date 11/11/20  Educator [facility]  Instruction Review Code 1- Verbalizes Understanding       Falls Prevention: - Provides verbal and written material to individual with discussion of falls prevention and safety. Flowsheet Row Cardiac Rehab from 11/11/2020 in  [facility] Cardiac and Pulmonary Rehab  Date 11/11/20  Educator [facility]  Instruction Review Code 1- Verbalizes Understanding       Other: -Provides group and verbal instruction on various topics (see comments)   Knowledge Questionnaire Score:  Knowledge Questionnaire Score - 11/10/20 1130       Knowledge Questionnaire Score   Pre Score 21/26             Core Components/Risk Factors/Patient Goals at Admission:  Personal Goals and Risk Factors at Admission - 11/11/20 1158       Core Components/Risk Factors/Patient Goals on Admission    Weight Management Weight Maintenance;Yes    Intervention Weight Management: Develop a combined nutrition and exercise program designed to reach desired caloric intake, while maintaining appropriate intake of nutrient and fiber, sodium and fats, and appropriate energy expenditure required for the weight goal.;Weight Management: Provide education and appropriate resources to help participant work on and attain dietary goals.    Admit Weight 119 lb 9.6 oz (54.3 kg)    Goal Weight: Short Term 119 lb (54 kg)    Goal Weight: Long Term 119 lb (54 kg)    Expected Outcomes Weight Maintenance: Understanding of the daily nutrition guidelines, which includes 25-35% calories from fat, 7% or less cal from saturated fats, less than 200mg  cholesterol, less than 1.5gm of sodium, & 5 or more servings of fruits and vegetables daily;Short Term: Continue to assess and modify interventions until short term weight is achieved;Long Term: Adherence to nutrition and physical activity/exercise program aimed toward attainment of established weight goal    Hypertension Yes    Intervention Provide education on lifestyle modifcations including regular physical activity/exercise, weight management, moderate sodium restriction and increased consumption of fresh fruit, vegetables, and low fat dairy, alcohol moderation, and smoking cessation.;Monitor prescription use compliance.    Expected  Outcomes Short Term: Continued assessment and intervention until BP is < 140/13mm HG in hypertensive participants. < 130/50mm HG in hypertensive participants with diabetes, heart failure or chronic kidney disease.;Long  Term: Maintenance of blood pressure at goal levels.             Education:Diabetes - Individual verbal and written instruction to review signs/symptoms of diabetes, desired ranges of glucose level fasting, after meals and with exercise. Acknowledge that pre and post exercise glucose checks will be done for 3 sessions at entry of program.   Core Components/Risk Factors/Patient Goals Review:    Core Components/Risk Factors/Patient Goals at Discharge (Final Review):    ITP Comments:  ITP Comments     Row Name 11/10/20 1133 11/11/20 1154         ITP Comments Initial telephone orientation completed. Diagnosis can be found in [facility] 9/2. EP orientation scheduled for Thursday 9/29 at 9:30 Completed 6MWT and gym orientation. Initial ITP created and sent for review to Dr. Emily Filbert, Medical Director.               Comments: Initial ITP

## 2020-11-16 ENCOUNTER — Encounter: Payer: Medicare PPO | Attending: Cardiovascular Disease

## 2020-11-16 ENCOUNTER — Other Ambulatory Visit: Payer: Self-pay

## 2020-11-16 DIAGNOSIS — Z955 Presence of coronary angioplasty implant and graft: Secondary | ICD-10-CM | POA: Diagnosis not present

## 2020-11-16 DIAGNOSIS — Z48812 Encounter for surgical aftercare following surgery on the circulatory system: Secondary | ICD-10-CM | POA: Insufficient documentation

## 2020-11-16 DIAGNOSIS — I208 Other forms of angina pectoris: Secondary | ICD-10-CM | POA: Diagnosis not present

## 2020-11-16 NOTE — Progress Notes (Signed)
Daily Session Note  Patient Details  Name: Theresa Hale MRN: [id number] Date of Birth: 09/18/1955 Referring Provider:   Flowsheet Row Cardiac Rehab from 11/11/2020 in [facility]  Referring Provider Arida, Muhammad MD  [Primary Cardiologist: Dr. Timothy Gollan]       Encounter Date: 11/16/2020  Check In:  Session Check In - 11/16/20 1005       Check-In   Supervising physician immediately available to respond to emergencies See telemetry face sheet for immediately available ER MD    Location [facility]    Staff Present Kelly Bollinger, MPA, RN;Jessica Hawkins, MA, RCEP, CCRP, CCET;Amanda Sommer, BA, ACSM CEP, Exercise Physiologist    Virtual Visit No    Medication changes reported     No    Fall or balance concerns reported    No    Warm-up and Cool-down Performed on first and last piece of equipment    Resistance Training Performed Yes    VAD Patient? No    PAD/SET Patient? No      Pain Assessment   Currently in Pain? No/denies                Social History   Tobacco Use  Smoking Status Never  Smokeless Tobacco Never    Goals Met:  Independence with exercise equipment Exercise tolerated well No report of concerns or symptoms today Strength training completed today  Goals Unmet:  Not Applicable  Comments: First full day of exercise!  Patient was oriented to gym and equipment including functions, settings, policies, and procedures.  Patient's individual exercise prescription and treatment plan were reviewed.  All starting workloads were established based on the results of the 6 minute walk test done at initial orientation visit.  The plan for exercise progression was also introduced and progression will be customized based on patient's performance and goals.    Dr. Mark Miller is Medical Director for [facility].  Dr. Fuad Aleskerov is Medical Director for [facility]. 

## 2020-11-17 ENCOUNTER — Encounter: Payer: Self-pay | Admitting: *Deleted

## 2020-11-17 DIAGNOSIS — Z955 Presence of coronary angioplasty implant and graft: Secondary | ICD-10-CM

## 2020-11-17 NOTE — Progress Notes (Signed)
Cardiac Individual Treatment Plan  Patient Details  Name: Theresa Hale MRN: [id number] Date of Birth: Oct 21, 1955 Referring Provider:   Flowsheet Row Cardiac Rehab from 11/11/2020 in [facility] Re[facility]  Referring Provider Kathlyn Sacramento MD  [facility] Cardiologist: Dr. Ida Rogue       Initial Encounter Date:  Flowsheet Row Cardiac Rehab from 11/11/2020 in [facility] Re[facility]  Date 11/11/20       Visit Diagnosis: Status post coronary artery stent placement  Patient's Home Medications on Admission:  Current Outpatient Medications:    aspirin EC 81 MG tablet, Take 1 tablet (81 mg total) by mouth daily. Swallow whole. (Patient taking differently: Take 81 mg by mouth every evening. Swallow whole.), Disp: 90 tablet, Rfl: 3   Calcium 600-400 MG-UNIT CHEW, Chew 1 tablet by mouth in the morning., Disp: , Rfl:    clopidogrel (PLAVIX) 75 MG tablet, Take 1 tablet (75 mg total) by mouth daily., Disp: 90 tablet, Rfl: 2   pantoprazole (PROTONIX) 20 MG tablet, Take 1 tablet (20 mg total) by mouth daily., Disp: 90 tablet, Rfl: 2   rosuvastatin (CRESTOR) 20 MG tablet, Take 1 tablet (20 mg total) by mouth in the morning., Disp: 90 tablet, Rfl: 2  Past Medical History: Past Medical History:  Diagnosis Date   Atrial flutter ([place])    a. Dx in 2018. Initially only a single episode. (CHA2DS2VASc = 2); b. 11/2020 Recurrent Aflutter s/p DCCV. Eliquis started; c. 12/2020 s/p RFCA; d. 01/2021 Eliquis d/c'd.   Breast cancer (HCC)    Elevated coronary artery calcium score    a. 07/2019 Cardiac CT: Ca2+ 262 (90th %'ile).   GERD (gastroesophageal reflux disease)    History of echocardiogram    a. 06/2016 Echo: EF 60-65%, no rwma. Mild TR.   Personal history of radiation therapy 02/2018   PONV (postoperative nausea and vomiting)    PSVT (paroxysmal supraventricular tachycardia) ([place])    a. 06/2019 Zio: RSR, avg HR 71, max HR 222. 97 runs of SVT, fastest 222 (4 beats), longest 1:05  (164).    Tobacco Use: Social History   Tobacco Use  Smoking Status Never  Smokeless Tobacco Never    Labs: Recent Review Flowsheet Data   There is no flowsheet data to display.      Exercise Target Goals: Exercise Program Goal: Individual exercise prescription set using results from initial 6 min walk test and THRR while considering  patient's activity barriers and safety.   Exercise Prescription Goal: Initial exercise prescription builds to 30-45 minutes a day of aerobic activity, 2-3 days per week.  Home exercise guidelines will be given to patient during program as part of exercise prescription that the participant will acknowledge.   Education: Aerobic Exercise: - Group verbal and visual presentation on the components of exercise prescription. Introduces F.I.T.T principle from ACSM for exercise prescriptions.  Reviews F.I.T.T. principles of aerobic exercise including progression. Written material given at graduation.   Education: Resistance Exercise: - Group verbal and visual presentation on the components of exercise prescription. Introduces F.I.T.T principle from ACSM for exercise prescriptions  Reviews F.I.T.T. principles of resistance exercise including progression. Written material given at graduation.    Education: Exercise & Equipment Safety: - Individual verbal instruction and demonstration of equipment use and safety with use of the equipment. Flowsheet Row Cardiac Rehab from 11/11/2020 in [facility] Cardiac and Pulmonary Rehab  Date 11/11/20  Educator [facility]  Instruction Review Code 1- Verbalizes Understanding  Education: Exercise Physiology & General Exercise Guidelines: - Group verbal and written instruction with models to review the exercise physiology of the cardiovascular system and associated critical values. Provides general exercise guidelines with specific guidelines to those with heart or lung disease.  Flowsheet Row Cardiac Rehab from 11/11/2020 in  [facility] Cardiac and Pulmonary Rehab  Education need identified 11/10/20       Education: Flexibility, Balance, Mind/Body Relaxation: - Group verbal and visual presentation with interactive activity on the components of exercise prescription. Introduces F.I.T.T principle from ACSM for exercise prescriptions. Reviews F.I.T.T. principles of flexibility and balance exercise training including progression. Also discusses the mind body connection.  Reviews various relaxation techniques to help reduce and manage stress (i.e. Deep breathing, progressive muscle relaxation, and visualization). Balance handout provided to take home. Written material given at graduation.   Activity Barriers & Risk Stratification:  Activity Barriers & Cardiac Risk Stratification - 11/11/20 1155       Activity Barriers & Cardiac Risk Stratification   Activity Barriers Joint Problems;Other (comment);Deconditioning;Muscular Weakness    Comments Breast CA '19 (removed 2 lymph nodes rt arm that can cause pain randomly), anerysm pulls some in leg    Cardiac Risk Stratification High             6 Minute Walk:  6 Minute Walk     Row Name 11/11/20 1155         6 Minute Walk   Phase Initial     Distance 1340 feet     Walk Time 6 minutes     # of Rest Breaks 0     MPH 2.53     METS 3.77     RPE 9     VO2 Peak 13.2     Symptoms Yes (comment)     Comments leg anerysm pulls some with walking     Resting HR 69 bpm     Resting BP 146/70     Resting Oxygen Saturation  100 %     Exercise Oxygen Saturation  during 6 min walk 99 %     Max Ex. HR 109 bpm     Max Ex. BP 152/74     2 Minute Post BP 120/66              Oxygen Initial Assessment:   Oxygen Re-Evaluation:   Oxygen Discharge (Final Oxygen Re-Evaluation):   Initial Exercise Prescription:  Initial Exercise Prescription - 11/11/20 1100       Date of Initial Exercise RX and Referring Provider   Date 11/11/20    Referring Provider Kathlyn Sacramento MD   Primary Cardiologist: Dr. Ida Rogue     Oxygen   Maintain Oxygen Saturation 88% or higher      Treadmill   MPH 2.5    Grade 0.5    Minutes 15    METs 3.09      NuStep   Level 3    SPM 80    Minutes 15    METs 3      Elliptical   Level 1    Speed 3.6    Minutes 15    METs 3      Prescription Details   Frequency (times per week) 2    Duration Progress to 30 minutes of continuous aerobic without signs/symptoms of physical distress      Intensity   THRR 40-80% of Max Heartrate 103-136    Ratings of Perceived Exertion 11-13    Perceived  Dyspnea 0-4      Progression   Progression Continue to progress workloads to maintain intensity without signs/symptoms of physical distress.      Resistance Training   Training Prescription Yes    Weight 3 lb    Reps 10-15             Perform Capillary Blood Glucose checks as needed.  Exercise Prescription Changes:   Exercise Prescription Changes     Row Name 11/11/20 1100             Response to Exercise   Blood Pressure (Admit) 146/70       Blood Pressure (Exercise) 152/74       Blood Pressure (Exit) 120/66       Heart Rate (Admit) 69 bpm       Heart Rate (Exercise) 109 bpm       Heart Rate (Exit) 80 bpm       Oxygen Saturation (Admit) 100 %       Oxygen Saturation (Exercise) 99 %       Rating of Perceived Exertion (Exercise) 9       Symptoms leg anerysm pulls some       Comments walk test results                Exercise Comments:   Exercise Comments     Row Name 11/16/20 1006           Exercise Comments First full day of exercise!  Patient was oriented to gym and equipment including functions, settings, policies, and procedures.  Patient's individual exercise prescription and treatment plan were reviewed.  All starting workloads were established based on the results of the 6 minute walk test done at initial orientation visit.  The plan for exercise progression was also introduced  and progression will be customized based on patient's performance and goals.                Exercise Goals and Review:   Exercise Goals     Row Name 11/11/20 1157             Exercise Goals   Increase Physical Activity Yes       Intervention Provide advice, education, support and counseling about physical activity/exercise needs.;Develop an individualized exercise prescription for aerobic and resistive training based on initial evaluation findings, risk stratification, comorbidities and participant's personal goals.       Expected Outcomes Short Term: Attend rehab on a regular basis to increase amount of physical activity.;Long Term: Add in home exercise to make exercise part of routine and to increase amount of physical activity.;Long Term: Exercising regularly at least 3-5 days a week.       Increase Strength and Stamina Yes       Intervention Provide advice, education, support and counseling about physical activity/exercise needs.;Develop an individualized exercise prescription for aerobic and resistive training based on initial evaluation findings, risk stratification, comorbidities and participant's personal goals.       Expected Outcomes Short Term: Increase workloads from initial exercise prescription for resistance, speed, and METs.;Short Term: Perform resistance training exercises routinely during rehab and add in resistance training at home;Long Term: Improve cardiorespiratory fitness, muscular endurance and strength as measured by increased METs and functional capacity (6MWT)       Able to understand and use rate of perceived exertion (RPE) scale Yes       Intervention Provide education and explanation on how to use RPE scale  Expected Outcomes Short Term: Able to use RPE daily in rehab to express subjective intensity level;Long Term:  Able to use RPE to guide intensity level when exercising independently       Able to understand and use Dyspnea scale Yes        Intervention Provide education and explanation on how to use Dyspnea scale       Expected Outcomes Short Term: Able to use Dyspnea scale daily in rehab to express subjective sense of shortness of breath during exertion;Long Term: Able to use Dyspnea scale to guide intensity level when exercising independently       Knowledge and understanding of Target Heart Rate Range (THRR) Yes       Intervention Provide education and explanation of THRR including how the numbers were predicted and where they are located for reference       Expected Outcomes Short Term: Able to state/look up THRR;Long Term: Able to use THRR to govern intensity when exercising independently;Short Term: Able to use daily as guideline for intensity in rehab       Able to check pulse independently Yes       Intervention Provide education and demonstration on how to check pulse in carotid and radial arteries.;Review the importance of being able to check your own pulse for safety during independent exercise       Expected Outcomes Short Term: Able to explain why pulse checking is important during independent exercise;Long Term: Able to check pulse independently and accurately       Understanding of Exercise Prescription Yes       Intervention Provide education, explanation, and written materials on patient's individual exercise prescription       Expected Outcomes Short Term: Able to explain program exercise prescription;Long Term: Able to explain home exercise prescription to exercise independently                Exercise Goals Re-Evaluation :  Exercise Goals Re-Evaluation     Row Name 11/16/20 1006             Exercise Goal Re-Evaluation   Exercise Goals Review Increase Physical Activity;Able to understand and use rate of perceived exertion (RPE) scale;Knowledge and understanding of Target Heart Rate Range (THRR);Understanding of Exercise Prescription;Increase Strength and Stamina;Able to understand and use Dyspnea  scale;Able to check pulse independently       Comments Reviewed RPE and dyspnea scales, THR and program prescription with pt today.  Pt voiced understanding and was given a copy of goals to take home.       Expected Outcomes Short: Use RPE daily to regulate intensity. Long: Follow program prescription in THR.                Discharge Exercise Prescription (Final Exercise Prescription Changes):  Exercise Prescription Changes - 11/11/20 1100       Response to Exercise   Blood Pressure (Admit) 146/70    Blood Pressure (Exercise) 152/74    Blood Pressure (Exit) 120/66    Heart Rate (Admit) 69 bpm    Heart Rate (Exercise) 109 bpm    Heart Rate (Exit) 80 bpm    Oxygen Saturation (Admit) 100 %    Oxygen Saturation (Exercise) 99 %    Rating of Perceived Exertion (Exercise) 9    Symptoms leg anerysm pulls some    Comments walk test results             Nutrition:  Target Goals: Understanding of nutrition guidelines, daily intake  of sodium 1500mg , cholesterol 200mg , calories 30% from fat and 7% or less from saturated fats, daily to have 5 or more servings of fruits and vegetables.  Education: All About Nutrition: -Group instruction provided by verbal, written material, interactive activities, discussions, models, and posters to present general guidelines for heart healthy nutrition including fat, fiber, MyPlate, the role of sodium in heart healthy nutrition, utilization of the nutrition label, and utilization of this knowledge for meal planning. Follow up email sent as well. Written material given at graduation. Flowsheet Row Cardiac Rehab from 11/11/2020 in [facility] Cardiac and Pulmonary Rehab  Education need identified 11/10/20       Biometrics:  Pre Biometrics - 11/11/20 1158       Pre Biometrics   Height 5' 5.1" (1.654 m)    Weight 119 lb 9.6 oz (54.3 kg)    BMI (Calculated) 19.83    Single Leg Stand 28.3 seconds              Nutrition Therapy Plan and Nutrition  Goals:  Nutrition Therapy & Goals - 11/11/20 1158       Intervention Plan   Intervention Prescribe, educate and counsel regarding individualized specific dietary modifications aiming towards targeted core components such as weight, hypertension, lipid management, diabetes, heart failure and other comorbidities.    Expected Outcomes Short Term Goal: Understand basic principles of dietary content, such as calories, fat, sodium, cholesterol and nutrients.;Short Term Goal: A plan has been developed with personal nutrition goals set during dietitian appointment.;Long Term Goal: Adherence to prescribed nutrition plan.             Nutrition Assessments:  MEDIFICTS Score Key: ?70 Need to make dietary changes  40-70 Heart Healthy Diet ? 40 Therapeutic Level Cholesterol Diet  Flowsheet Row Cardiac Rehab from 11/10/2020 in [facility] Cardiac and Pulmonary Rehab  Picture Your Plate Total Score on Admission 54      Picture Your Plate Scores: <26 Unhealthy dietary pattern with much room for improvement. 41-50 Dietary pattern unlikely to meet recommendations for good health and room for improvement. 51-60 More healthful dietary pattern, with some room for improvement.  >60 Healthy dietary pattern, although there may be some specific behaviors that could be improved.    Nutrition Goals Re-Evaluation:   Nutrition Goals Discharge (Final Nutrition Goals Re-Evaluation):   Psychosocial: Target Goals: Acknowledge presence or absence of significant depression and/or stress, maximize coping skills, provide positive support system. Participant is able to verbalize types and ability to use techniques and skills needed for reducing stress and depression.   Education: Stress, Anxiety, and Depression - Group verbal and visual presentation to define topics covered.  Reviews how body is impacted by stress, anxiety, and depression.  Also discusses healthy ways to reduce stress and to treat/manage anxiety and  depression.  Written material given at graduation.   Education: Sleep Hygiene -Provides group verbal and written instruction about how sleep can affect your health.  Define sleep hygiene, discuss sleep cycles and impact of sleep habits. Review good sleep hygiene tips.    Initial Review & Psychosocial Screening:  Initial Psych Review & Screening - 11/10/20 1109       Initial Review   Current issues with Current Sleep Concerns      Family Dynamics   Good Support System? Yes   husband     Barriers   Psychosocial barriers to participate in program There are no identifiable barriers or psychosocial needs.;The patient should benefit from training in stress management and  relaxation.      Screening Interventions   Interventions Encouraged to exercise;To provide support and resources with identified psychosocial needs;Provide feedback about the scores to participant    Expected Outcomes Short Term goal: Utilizing psychosocial counselor, staff and physician to assist with identification of specific Stressors or current issues interfering with healing process. Setting desired goal for each stressor or current issue identified.;Long Term Goal: Stressors or current issues are controlled or eliminated.;Short Term goal: Identification and review with participant of any Quality of Life or Depression concerns found by scoring the questionnaire.;Long Term goal: The participant improves quality of Life and PHQ9 Scores as seen by post scores and/or verbalization of changes             Quality of Life Scores:   Quality of Life - 11/10/20 1130       Quality of Life   Select Quality of Life      Quality of Life Scores   Health/Function Pre 20.73 %    Socioeconomic Pre 25.63 %    Psych/Spiritual Pre 28.43 %    Family Pre 28.5 %    GLOBAL Pre 24.5 %            Scores of 19 and below usually indicate a poorer quality of life in these areas.  A difference of  2-3 points is a clinically  meaningful difference.  A difference of 2-3 points in the total score of the Quality of Life Index has been associated with significant improvement in overall quality of life, self-image, physical symptoms, and general health in studies assessing change in quality of life.  PHQ-9: Recent Review Flowsheet Data     Depression screen [facility] 2/9 11/11/2020   Decreased Interest 0   Down, Depressed, Hopeless 0   PHQ - 2 Score 0   Altered sleeping 0   Tired, decreased energy 1   Change in appetite 0   Feeling bad or failure about yourself  0   Trouble concentrating 0   Moving slowly or fidgety/restless 0   Suicidal thoughts 0   PHQ-9 Score 1   Difficult doing work/chores Not difficult at all      Interpretation of Total Score  Total Score Depression Severity:  1-4 = Minimal depression, 5-9 = Mild depression, 10-14 = Moderate depression, 15-19 = Moderately severe depression, 20-27 = Severe depression   Psychosocial Evaluation and Intervention:  Psychosocial Evaluation - 11/10/20 1125       Psychosocial Evaluation & Interventions   Interventions Encouraged to exercise with the program and follow exercise prescription    Comments Lovey Newcomer is coming to cardiac rehab for a stent. She does have a history of anxiety but states it isn't an issue currently. She is taking her medications as she should and states her blood pressure has gotten better after the stent placement. Her sleep concern is related to her bad dreams that she has been struggling with for a while now. One dream was so bad that it put her into aflutter and she had to get an ablation. She has tried tracking her food intake and watching what she does right before bed with no improvement. She is wanting to get into the program to receive education and feel more confident in managing her heart healthy lifestyle    Expected Outcomes Short: attend cardiac rehab for education and exercise. Long: develop and maintain positive self care habits.     Continue Psychosocial Services  Follow up required by staff  Psychosocial Re-Evaluation:   Psychosocial Discharge (Final Psychosocial Re-Evaluation):   Vocational Rehabilitation: Provide vocational rehab assistance to qualifying candidates.   Vocational Rehab Evaluation & Intervention:  Vocational Rehab - 11/10/20 1107       Initial Vocational Rehab Evaluation & Intervention   Assessment shows need for Vocational Rehabilitation No             Education: Education Goals: Education classes will be provided on a variety of topics geared toward better understanding of heart health and risk factor modification. Participant will state understanding/return demonstration of topics presented as noted by education test scores.  Learning Barriers/Preferences:  Learning Barriers/Preferences - 11/10/20 1125       Learning Barriers/Preferences   Learning Barriers None    Learning Preferences None             General Cardiac Education Topics:  AED/CPR: - Group verbal and written instruction with the use of models to demonstrate the basic use of the AED with the basic ABC's of resuscitation.   Anatomy and Cardiac Procedures: - Group verbal and visual presentation and models provide information about basic cardiac anatomy and function. Reviews the testing methods done to diagnose heart disease and the outcomes of the test results. Describes the treatment choices: Medical Management, Angioplasty, or Coronary Bypass Surgery for treating various heart conditions including Myocardial Infarction, Angina, Valve Disease, and Cardiac Arrhythmias.  Written material given at graduation. Flowsheet Row Cardiac Rehab from 11/11/2020 in [facility] Cardiac and Pulmonary Rehab  Education need identified 11/10/20       Medication Safety: - Group verbal and visual instruction to review commonly prescribed medications for heart and lung disease. Reviews the medication, class of the  drug, and side effects. Includes the steps to properly store meds and maintain the prescription regimen.  Written material given at graduation.   Intimacy: - Group verbal instruction through game format to discuss how heart and lung disease can affect sexual intimacy. Written material given at graduation..   Know Your Numbers and Heart Failure: - Group verbal and visual instruction to discuss disease risk factors for cardiac and pulmonary disease and treatment options.  Reviews associated critical values for Overweight/Obesity, Hypertension, Cholesterol, and Diabetes.  Discusses basics of heart failure: signs/symptoms and treatments.  Introduces Heart Failure Zone chart for action plan for heart failure.  Written material given at graduation.   Infection Prevention: - Provides verbal and written material to individual with discussion of infection control including proper hand washing and proper equipment cleaning during exercise session. Flowsheet Row Cardiac Rehab from 11/11/2020 in [facility] Cardiac and Pulmonary Rehab  Date 11/11/20  Educator [facility]  Instruction Review Code 1- Verbalizes Understanding       Falls Prevention: - Provides verbal and written material to individual with discussion of falls prevention and safety. Flowsheet Row Cardiac Rehab from 11/11/2020 in [facility] Cardiac and Pulmonary Rehab  Date 11/11/20  Educator [facility]  Instruction Review Code 1- Verbalizes Understanding       Other: -Provides group and verbal instruction on various topics (see comments)   Knowledge Questionnaire Score:  Knowledge Questionnaire Score - 11/10/20 1130       Knowledge Questionnaire Score   Pre Score 21/26             Core Components/Risk Factors/Patient Goals at Admission:  Personal Goals and Risk Factors at Admission - 11/11/20 1158       Core Components/Risk Factors/Patient Goals on Admission    Weight Management Weight Maintenance;Yes  Intervention Weight Management: Develop  a combined nutrition and exercise program designed to reach desired caloric intake, while maintaining appropriate intake of nutrient and fiber, sodium and fats, and appropriate energy expenditure required for the weight goal.;Weight Management: Provide education and appropriate resources to help participant work on and attain dietary goals.    Admit Weight 119 lb 9.6 oz (54.3 kg)    Goal Weight: Short Term 119 lb (54 kg)    Goal Weight: Long Term 119 lb (54 kg)    Expected Outcomes Weight Maintenance: Understanding of the daily nutrition guidelines, which includes 25-35% calories from fat, 7% or less cal from saturated fats, less than 200mg  cholesterol, less than 1.5gm of sodium, & 5 or more servings of fruits and vegetables daily;Short Term: Continue to assess and modify interventions until short term weight is achieved;Long Term: Adherence to nutrition and physical activity/exercise program aimed toward attainment of established weight goal    Hypertension Yes    Intervention Provide education on lifestyle modifcations including regular physical activity/exercise, weight management, moderate sodium restriction and increased consumption of fresh fruit, vegetables, and low fat dairy, alcohol moderation, and smoking cessation.;Monitor prescription use compliance.    Expected Outcomes Short Term: Continued assessment and intervention until BP is < 140/85mm HG in hypertensive participants. < 130/4mm HG in hypertensive participants with diabetes, heart failure or chronic kidney disease.;Long Term: Maintenance of blood pressure at goal levels.             Education:Diabetes - Individual verbal and written instruction to review signs/symptoms of diabetes, desired ranges of glucose level fasting, after meals and with exercise. Acknowledge that pre and post exercise glucose checks will be done for 3 sessions at entry of program.   Core Components/Risk Factors/Patient Goals Review:    Core  Components/Risk Factors/Patient Goals at Discharge (Final Review):    ITP Comments:  ITP Comments     Row Name 11/10/20 1133 11/11/20 1154 11/16/20 1006 11/17/20 0916     ITP Comments Initial telephone orientation completed. Diagnosis can be found in [facility] 9/2. EP orientation scheduled for Thursday 9/29 at 9:30 Completed 6MWT and gym orientation. Initial ITP created and sent for review to Dr. Emily Filbert, Medical Director. First full day of exercise!  Patient was oriented to gym and equipment including functions, settings, policies, and procedures.  Patient's individual exercise prescription and treatment plan were reviewed.  All starting workloads were established based on the results of the 6 minute walk test done at initial orientation visit.  The plan for exercise progression was also introduced and progression will be customized based on patient's performance and goals. 30 day review completed. ITP sent to Dr. Emily Filbert, Medical Director of Cardiac Rehab. Continue with ITP unless changes are made by physician.  Pt just started this week.             Comments:30 day review

## 2020-11-18 ENCOUNTER — Other Ambulatory Visit: Payer: Self-pay

## 2020-11-18 DIAGNOSIS — Z48812 Encounter for surgical aftercare following surgery on the circulatory system: Secondary | ICD-10-CM | POA: Diagnosis not present

## 2020-11-18 DIAGNOSIS — Z955 Presence of coronary angioplasty implant and graft: Secondary | ICD-10-CM | POA: Diagnosis not present

## 2020-11-18 DIAGNOSIS — I208 Other forms of angina pectoris: Secondary | ICD-10-CM | POA: Diagnosis not present

## 2020-11-18 NOTE — Progress Notes (Signed)
Daily Session Note  Patient Details  Name: Theresa Hale MRN: [id number] Date of Birth: 11-11-1955 Referring Provider:   Flowsheet Row Cardiac Rehab from 11/11/2020 in [facility] Cardiac and Pulmonary Rehab  Referring Provider Kathlyn Sacramento MD  [facility] Cardiologist: Dr. Ida Rogue       Encounter Date: 11/18/2020  Check In:  Session Check In - 11/18/20 0939       Check-In   Supervising physician immediately available to respond to emergencies See telemetry face sheet for immediately available ER MD    Location [facility]-Cardiac & Pulmonary Rehab    Staff Present Birdie Sons, MPA, RN;Jessica Luan Pulling, MA, RCEP, CCRP, CCET;Amanda Sommer, BA, ACSM CEP, Exercise Physiologist    Virtual Visit No    Medication changes reported     No    Fall or balance concerns reported    No    Warm-up and Cool-down Performed on first and last piece of equipment    Resistance Training Performed Yes    VAD Patient? No    PAD/SET Patient? No      Pain Assessment   Currently in Pain? No/denies                Social History   Tobacco Use  Smoking Status Never  Smokeless Tobacco Never    Goals Met:  Independence with exercise equipment Exercise tolerated well No report of concerns or symptoms today Strength training completed today  Goals Unmet:  Not Applicable  Comments: Pt able to follow exercise prescription today without complaint.  Will continue to monitor for progression.    Dr. Emily Filbert is Medical Director for [place].  Dr. Ottie Glazier is Medical Director for [facility] Pulmonary Rehabilitation.

## 2020-11-23 ENCOUNTER — Other Ambulatory Visit: Payer: Self-pay

## 2020-11-23 DIAGNOSIS — Z955 Presence of coronary angioplasty implant and graft: Secondary | ICD-10-CM

## 2020-11-23 DIAGNOSIS — I208 Other forms of angina pectoris: Secondary | ICD-10-CM | POA: Diagnosis not present

## 2020-11-23 DIAGNOSIS — Z48812 Encounter for surgical aftercare following surgery on the circulatory system: Secondary | ICD-10-CM | POA: Diagnosis not present

## 2020-11-23 NOTE — Progress Notes (Signed)
Daily Session Note  Patient Details  Name: Theresa Hale MRN: [id number] Date of Birth: Jul 11, 1955 Referring Provider:   Flowsheet Row Cardiac Rehab from 11/11/2020 in [facility] Cardiac and Pulmonary Rehab  Referring Provider Kathlyn Sacramento MD  [facility] Cardiologist: Dr. Ida Rogue       Encounter Date: 11/23/2020  Check In:  Session Check In - 11/23/20 1030       Check-In   Supervising physician immediately available to respond to emergencies See telemetry face sheet for immediately available ER MD    Location [facility]-Cardiac & Pulmonary Rehab    Staff Present Birdie Sons, MPA, RN;Jessica [place], MA, RCEP, CCRP, CCET;Amanda Sommer, BA, ACSM CEP, Exercise Physiologist    Virtual Visit No    Medication changes reported     No    Fall or balance concerns reported    No    Warm-up and Cool-down Performed on first and last piece of equipment    Resistance Training Performed Yes    VAD Patient? No    PAD/SET Patient? No      Pain Assessment   Currently in Pain? No/denies                Social History   Tobacco Use  Smoking Status Never  Smokeless Tobacco Never    Goals Met:  Independence with exercise equipment Exercise tolerated well No report of concerns or symptoms today Strength training completed today  Goals Unmet:  Not Applicable  Comments: Pt able to follow exercise prescription today without complaint.  Will continue to monitor for progression.    Dr. Emily Filbert is Medical Director for [place].  Dr. Ottie Glazier is Medical Director for [facility] Pulmonary Rehabilitation.

## 2020-11-23 NOTE — Progress Notes (Signed)
Completed initial nutrition consultation.   

## 2020-11-25 ENCOUNTER — Other Ambulatory Visit: Payer: Self-pay

## 2020-11-25 DIAGNOSIS — Z955 Presence of coronary angioplasty implant and graft: Secondary | ICD-10-CM | POA: Diagnosis not present

## 2020-11-25 DIAGNOSIS — I208 Other forms of angina pectoris: Secondary | ICD-10-CM | POA: Diagnosis not present

## 2020-11-25 DIAGNOSIS — Z48812 Encounter for surgical aftercare following surgery on the circulatory system: Secondary | ICD-10-CM | POA: Diagnosis not present

## 2020-11-25 NOTE — Progress Notes (Signed)
Daily Session Note  Patient Details  Name: Theresa Hale MRN: [id number] Date of Birth: October 12, 1955 Referring Provider:   Flowsheet Row Cardiac Rehab from 11/11/2020 in [facility] Cardiac and Pulmonary Rehab  Referring Provider Kathlyn Sacramento MD  [facility] Cardiologist: Dr. Ida Rogue       Encounter Date: 11/25/2020  Check In:  Session Check In - 11/25/20 0957       Check-In   Supervising physician immediately available to respond to emergencies See telemetry face sheet for immediately available ER MD    Location [facility]-Cardiac & Pulmonary Rehab    Staff Present Birdie Sons, MPA, Elveria Rising, BA, ACSM CEP, Exercise Physiologist;Meredith Sherryll Burger, RN BSN;Melissa Caiola, RDN, LDN    Virtual Visit No    Medication changes reported     No    Fall or balance concerns reported    No    Warm-up and Cool-down Performed on first and last piece of equipment    Resistance Training Performed Yes    VAD Patient? No    PAD/SET Patient? No      Pain Assessment   Currently in Pain? No/denies                Social History   Tobacco Use  Smoking Status Never  Smokeless Tobacco Never    Goals Met:  Independence with exercise equipment Exercise tolerated well No report of concerns or symptoms today Strength training completed today  Goals Unmet:  Not Applicable  Comments: Pt able to follow exercise prescription today without complaint.  Will continue to monitor for progression.    Dr. Emily Filbert is Medical Director for [place].  Dr. Ottie Glazier is Medical Director for [facility] Pulmonary Rehabilitation.

## 2020-11-29 ENCOUNTER — Telehealth: Payer: Self-pay

## 2020-11-29 NOTE — Telephone Encounter (Signed)
Reach out to Theresa Hale to f/u on Dr. Donivan Scull request d/t pt's HR while at Cardiac Rehab, as there is concern for Theresa Hale HR being to elevated. Advised Theresa Hale on what [place] and Dr. Rockey Situ concerns.  Marcene Brawn from cardiac rehab reach out to Dr. Rockey Situ regarding pt's HR while with cardiac rehab and stated  "Theresa Hale surpassed her max HR that we calculated. Her individual target heart rate is calculated from 103-136 bpm. The first session her HR reached a max of 157 bpm and the other time she reached 148 bpm. Patient did not have any symptoms"  Dr. Rockey Situ was able to review and advised  Can we call her, we would recommend she start metoprolol succinate 25 daily  She did not want more medications on last clinic visit but heart rate is running too high on exertion  Significant benefits  using beta-blockers on patients with coronary disease  Thx  TGollan   Theresa Hale st she does NOT want to be on metoprolol "I was on it before, made my BP get too low and I do not want to be on it again, my BP is great right now and I want to keep it that way"  Advised will let Dr. Rockey Situ know to see if there is alternative, pt reports "I really don't want to play around with my meds as my BP and HR are great outside of rehab".  Will r/t to Dr. Rockey Situ, pt aware will call back if Dr. Rockey Situ has other suggestions, otherwise, Theresa Hale is thankful for reaching out to her.

## 2020-12-07 ENCOUNTER — Other Ambulatory Visit: Payer: Self-pay

## 2020-12-07 DIAGNOSIS — Z955 Presence of coronary angioplasty implant and graft: Secondary | ICD-10-CM

## 2020-12-07 DIAGNOSIS — I208 Other forms of angina pectoris: Secondary | ICD-10-CM | POA: Diagnosis not present

## 2020-12-07 DIAGNOSIS — Z48812 Encounter for surgical aftercare following surgery on the circulatory system: Secondary | ICD-10-CM | POA: Diagnosis not present

## 2020-12-07 NOTE — Progress Notes (Signed)
Daily Session Note  Patient Details  Name: Theresa Hale MRN: [id number] Date of Birth: 09/28/1955 Referring Provider:   Flowsheet Row Cardiac Rehab from 11/11/2020 in [facility] Cardiac and Pulmonary Rehab  Referring Provider Kathlyn Sacramento MD  [facility] Cardiologist: Dr. Ida Rogue       Encounter Date: 12/07/2020  Check In:  Session Check In - 12/07/20 1004       Check-In   Supervising physician immediately available to respond to emergencies See telemetry face sheet for immediately available ER MD    Location [facility]-Cardiac & Pulmonary Rehab    Staff Present Birdie Sons, MPA, Nino Glow, MS, ASCM CEP, Exercise Physiologist;Melissa [place], RDN, Rowe Pavy, BA, ACSM CEP, Exercise Physiologist    Virtual Visit No    Medication changes reported     No    Fall or balance concerns reported    No    Warm-up and Cool-down Performed on first and last piece of equipment    Resistance Training Performed Yes    VAD Patient? No    PAD/SET Patient? No      Pain Assessment   Currently in Pain? No/denies                Social History   Tobacco Use  Smoking Status Never  Smokeless Tobacco Never    Goals Met:  Independence with exercise equipment Exercise tolerated well Personal goals reviewed No report of concerns or symptoms today Strength training completed today  Goals Unmet:  Not Applicable  Comments: Pt able to follow exercise prescription today without complaint.  Will continue to monitor for progression.    Dr. Emily Filbert is Medical Director for [place].  Dr. Ottie Glazier is Medical Director for [facility] Pulmonary Rehabilitation.

## 2020-12-09 ENCOUNTER — Other Ambulatory Visit: Payer: Self-pay

## 2020-12-09 DIAGNOSIS — Z48812 Encounter for surgical aftercare following surgery on the circulatory system: Secondary | ICD-10-CM | POA: Diagnosis not present

## 2020-12-09 DIAGNOSIS — I208 Other forms of angina pectoris: Secondary | ICD-10-CM | POA: Diagnosis not present

## 2020-12-09 DIAGNOSIS — Z955 Presence of coronary angioplasty implant and graft: Secondary | ICD-10-CM | POA: Diagnosis not present

## 2020-12-09 NOTE — Progress Notes (Signed)
Daily Session Note  Patient Details  Name: Theresa Hale MRN: [id number] Date of Birth: 12/19/1955 Referring Provider:   Flowsheet Row Cardiac Rehab from 11/11/2020 in [facility] Cardiac and Pulmonary Rehab  Referring Provider Kathlyn Sacramento MD  [facility] Cardiologist: Dr. Ida Rogue       Encounter Date: 12/09/2020  Check In:  Session Check In - 12/09/20 0944       Check-In   Supervising physician immediately available to respond to emergencies See telemetry face sheet for immediately available ER MD    Location [facility]-Cardiac & Pulmonary Rehab    Staff Present Birdie Sons, MPA, RN;Melissa [place], RDN, Wilhelmina Mcardle, BS, ACSM CEP, Exercise Physiologist;Joseph Tessie Fass, [place]    Virtual Visit No    Medication changes reported     No    Fall or balance concerns reported    No    Warm-up and Cool-down Performed on first and last piece of equipment    Resistance Training Performed Yes    VAD Patient? No    PAD/SET Patient? No      Pain Assessment   Currently in Pain? No/denies                Social History   Tobacco Use  Smoking Status Never  Smokeless Tobacco Never    Goals Met:  Independence with exercise equipment Exercise tolerated well No report of concerns or symptoms today Strength training completed today  Goals Unmet:  Not Applicable  Comments: Pt able to follow exercise prescription today without complaint.  Will continue to monitor for progression.    Dr. Emily Filbert is Medical Director for [place].  Dr. Ottie Glazier is Medical Director for [facility] Pulmonary Rehabilitation.

## 2020-12-14 ENCOUNTER — Other Ambulatory Visit: Payer: Self-pay

## 2020-12-14 ENCOUNTER — Encounter: Payer: Medicare PPO | Attending: Cardiovascular Disease

## 2020-12-14 VITALS — Ht 65.1 in | Wt 118.9 lb

## 2020-12-14 DIAGNOSIS — Z955 Presence of coronary angioplasty implant and graft: Secondary | ICD-10-CM | POA: Diagnosis not present

## 2020-12-14 NOTE — Progress Notes (Signed)
Cardiac Individual Treatment Plan  Patient Details  Name: Theresa Hale MRN: [id number] Date of Birth: 1955/08/21 Referring Provider:   Flowsheet Row Cardiac Rehab from 11/11/2020 in [facility] Cardiac and Pulmonary Rehab  Referring Provider Kathlyn Sacramento MD  [facility] Cardiologist: Dr. Ida Rogue       Initial Encounter Date:  Flowsheet Row Cardiac Rehab from 11/11/2020 in [facility] Cardiac and Pulmonary Rehab  Date 11/11/20       Visit Diagnosis: Status post coronary artery stent placement  Patient's Home Medications on Admission:  Current Outpatient Medications:    aspirin EC 81 MG tablet, Take 1 tablet (81 mg total) by mouth daily. Swallow whole. (Patient taking differently: Take 81 mg by mouth every evening. Swallow whole.), Disp: 90 tablet, Rfl: 3   Calcium 600-400 MG-UNIT CHEW, Chew 1 tablet by mouth in the morning., Disp: , Rfl:    clopidogrel (PLAVIX) 75 MG tablet, Take 1 tablet (75 mg total) by mouth daily., Disp: 90 tablet, Rfl: 2   pantoprazole (PROTONIX) 20 MG tablet, Take 1 tablet (20 mg total) by mouth daily., Disp: 90 tablet, Rfl: 2   rosuvastatin (CRESTOR) 20 MG tablet, Take 1 tablet (20 mg total) by mouth in the morning., Disp: 90 tablet, Rfl: 2  Past Medical History: Past Medical History:  Diagnosis Date   Atrial flutter ([place])    a. Dx in 2018. Initially only a single episode. (CHA2DS2VASc = 2); b. 11/2020 Recurrent Aflutter s/p DCCV. Eliquis started; c. 12/2020 s/p RFCA; d. 01/2021 Eliquis d/c'd.   Breast cancer (HCC)    Elevated coronary artery calcium score    a. 07/2019 Cardiac CT: Ca2+ 262 (90th %'ile).   GERD (gastroesophageal reflux disease)    History of echocardiogram    a. 06/2016 Echo: EF 60-65%, no rwma. Mild TR.   Personal history of radiation therapy 02/2018   PONV (postoperative nausea and vomiting)    PSVT (paroxysmal supraventricular tachycardia) ([place])    a. 06/2019 Zio: RSR, avg HR 71, max HR 222. 97 runs of SVT, fastest 222 (4 beats), longest 1:05  (164).    Tobacco Use: Social History   Tobacco Use  Smoking Status Never  Smokeless Tobacco Never    Labs: Recent Review Flowsheet Data   There is no flowsheet data to display.      Exercise Target Goals: Exercise Program Goal: Individual exercise prescription set using results from initial 6 min walk test and THRR while considering  patient's activity barriers and safety.   Exercise Prescription Goal: Initial exercise prescription builds to 30-45 minutes a day of aerobic activity, 2-3 days per week.  Home exercise guidelines will be given to patient during program as part of exercise prescription that the participant will acknowledge.   Education: Aerobic Exercise: - Group verbal and visual presentation on the components of exercise prescription. Introduces F.I.T.T principle from ACSM for exercise prescriptions.  Reviews F.I.T.T. principles of aerobic exercise including progression. Written material given at graduation.   Education: Resistance Exercise: - Group verbal and visual presentation on the components of exercise prescription. Introduces F.I.T.T principle from ACSM for exercise prescriptions  Reviews F.I.T.T. principles of resistance exercise including progression. Written material given at graduation.    Education: Exercise & Equipment Safety: - Individual verbal instruction and demonstration of equipment use and safety with use of the equipment. Flowsheet Row Cardiac Rehab from 11/25/2020 in [facility] Cardiac and Pulmonary Rehab  Date 11/11/20  Educator [facility]  Instruction Review Code 1- Verbalizes Understanding  Education: Exercise Physiology & General Exercise Guidelines: - Group verbal and written instruction with models to review the exercise physiology of the cardiovascular system and associated critical values. Provides general exercise guidelines with specific guidelines to those with heart or lung disease.  Flowsheet Row Cardiac Rehab from 11/25/2020 in  [facility] Cardiac and Pulmonary Rehab  Education need identified 11/10/20       Education: Flexibility, Balance, Mind/Body Relaxation: - Group verbal and visual presentation with interactive activity on the components of exercise prescription. Introduces F.I.T.T principle from ACSM for exercise prescriptions. Reviews F.I.T.T. principles of flexibility and balance exercise training including progression. Also discusses the mind body connection.  Reviews various relaxation techniques to help reduce and manage stress (i.e. Deep breathing, progressive muscle relaxation, and visualization). Balance handout provided to take home. Written material given at graduation.   Activity Barriers & Risk Stratification:  Activity Barriers & Cardiac Risk Stratification - 11/11/20 1155       Activity Barriers & Cardiac Risk Stratification   Activity Barriers Joint Problems;Other (comment);Deconditioning;Muscular Weakness    Comments Breast CA '19 (removed 2 lymph nodes rt arm that can cause pain randomly), anerysm pulls some in leg    Cardiac Risk Stratification High             6 Minute Walk:  6 Minute Walk     Row Name 11/11/20 1155 12/14/20 1005       6 Minute Walk   Phase Initial Discharge    Distance 1340 feet 1610 feet    Distance % Change -- 20.1 %    Distance Feet Change -- 270 ft    Walk Time 6 minutes 6 minutes    # of Rest Breaks 0 0    MPH 2.53 3.05    METS 3.77 4.58    RPE 9 12    VO2 Peak 13.2 16.04    Symptoms Yes (comment) No    Comments leg anerysm pulls some with walking --    Resting HR 69 bpm 77 bpm    Resting BP 146/70 142/70    Resting Oxygen Saturation  100 % --    Exercise Oxygen Saturation  during 6 min walk 99 % --    Max Ex. HR 109 bpm 146 bpm    Max Ex. BP 152/74 144/72    2 Minute Post BP 120/66 --             Oxygen Initial Assessment:   Oxygen Re-Evaluation:   Oxygen Discharge (Final Oxygen Re-Evaluation):   Initial Exercise Prescription:   Initial Exercise Prescription - 11/11/20 1100       Date of Initial Exercise RX and Referring Provider   Date 11/11/20    Referring Provider Kathlyn Sacramento MD   Primary Cardiologist: Dr. Ida Rogue     Oxygen   Maintain Oxygen Saturation 88% or higher      Treadmill   MPH 2.5    Grade 0.5    Minutes 15    METs 3.09      NuStep   Level 3    SPM 80    Minutes 15    METs 3      Elliptical   Level 1    Speed 3.6    Minutes 15    METs 3      Prescription Details   Frequency (times per week) 2    Duration Progress to 30 minutes of continuous aerobic without signs/symptoms of physical distress  Intensity   THRR 40-80% of Max Heartrate 103-136    Ratings of Perceived Exertion 11-13    Perceived Dyspnea 0-4      Progression   Progression Continue to progress workloads to maintain intensity without signs/symptoms of physical distress.      Resistance Training   Training Prescription Yes    Weight 3 lb    Reps 10-15             Perform Capillary Blood Glucose checks as needed.  Exercise Prescription Changes:   Exercise Prescription Changes     Row Name 11/11/20 1100 11/22/20 1000 12/08/20 1200         Response to Exercise   Blood Pressure (Admit) 146/70 118/72 118/62     Blood Pressure (Exercise) 152/74 126/74 128/78     Blood Pressure (Exit) 120/66 116/60 118/60     Heart Rate (Admit) 69 bpm 76 bpm 81 bpm     Heart Rate (Exercise) 109 bpm 143 bpm 120 bpm     Heart Rate (Exit) 80 bpm 112 bpm 84 bpm     Oxygen Saturation (Admit) 100 % -- --     Oxygen Saturation (Exercise) 99 % -- --     Rating of Perceived Exertion (Exercise) '9 12 13     ' Symptoms leg anerysm pulls some none none     Comments walk test results first full day of exercise --     Duration -- Progress to 30 minutes of  aerobic without signs/symptoms of physical distress Continue with 30 min of aerobic exercise without signs/symptoms of physical distress.     Intensity -- THRR  unchanged THRR unchanged       Progression   Progression -- Continue to progress workloads to maintain intensity without signs/symptoms of physical distress. Continue to progress workloads to maintain intensity without signs/symptoms of physical distress.     Average METs -- 2.95 2.54       Resistance Training   Training Prescription -- Yes Yes     Weight -- 3 lb 3 lb     Reps -- 10-15 10-15       Interval Training   Interval Training -- No No       NuStep   Level -- 1 3     Minutes -- 15 15     METs -- 2.2 2.5       REL-XR   Level -- 2 --     Minutes -- 15 --     METs -- 4.8 --       Track   Laps -- 25 29     Minutes -- 15 15     METs -- 2.36 2.58              Exercise Comments:   Exercise Comments     Row Name 11/16/20 1006 12/14/20 0959         Exercise Comments First full day of exercise!  Patient was oriented to gym and equipment including functions, settings, policies, and procedures.  Patient's individual exercise prescription and treatment plan were reviewed.  All starting workloads were established based on the results of the 6 minute walk test done at initial orientation visit.  The plan for exercise progression was also introduced and progression will be customized based on patient's performance and goals. Theresa Hale graduated today from  rehab with 10 sessions completed.  Details of the patient's exercise prescription and what She needs to do in order to continue  the prescription and progress were discussed with patient.  Patient was given a copy of prescription and goals.  Patient verbalized understanding.  Theresa Hale plans to continue to exercise by going to [employer].               Exercise Goals and Review:   Exercise Goals     Row Name 11/11/20 1157             Exercise Goals   Increase Physical Activity Yes       Intervention Provide advice, education, support and counseling about physical activity/exercise needs.;Develop an individualized  exercise prescription for aerobic and resistive training based on initial evaluation findings, risk stratification, comorbidities and participant's personal goals.       Expected Outcomes Short Term: Attend rehab on a regular basis to increase amount of physical activity.;Long Term: Add in home exercise to make exercise part of routine and to increase amount of physical activity.;Long Term: Exercising regularly at least 3-5 days a week.       Increase Strength and Stamina Yes       Intervention Provide advice, education, support and counseling about physical activity/exercise needs.;Develop an individualized exercise prescription for aerobic and resistive training based on initial evaluation findings, risk stratification, comorbidities and participant's personal goals.       Expected Outcomes Short Term: Increase workloads from initial exercise prescription for resistance, speed, and METs.;Short Term: Perform resistance training exercises routinely during rehab and add in resistance training at home;Long Term: Improve cardiorespiratory fitness, muscular endurance and strength as measured by increased METs and functional capacity (6MWT)       Able to understand and use rate of perceived exertion (RPE) scale Yes       Intervention Provide education and explanation on how to use RPE scale       Expected Outcomes Short Term: Able to use RPE daily in rehab to express subjective intensity level;Long Term:  Able to use RPE to guide intensity level when exercising independently       Able to understand and use Dyspnea scale Yes       Intervention Provide education and explanation on how to use Dyspnea scale       Expected Outcomes Short Term: Able to use Dyspnea scale daily in rehab to express subjective sense of shortness of breath during exertion;Long Term: Able to use Dyspnea scale to guide intensity level when exercising independently       Knowledge and understanding of Target Heart Rate Range (THRR) Yes        Intervention Provide education and explanation of THRR including how the numbers were predicted and where they are located for reference       Expected Outcomes Short Term: Able to state/look up THRR;Long Term: Able to use THRR to govern intensity when exercising independently;Short Term: Able to use daily as guideline for intensity in rehab       Able to check pulse independently Yes       Intervention Provide education and demonstration on how to check pulse in carotid and radial arteries.;Review the importance of being able to check your own pulse for safety during independent exercise       Expected Outcomes Short Term: Able to explain why pulse checking is important during independent exercise;Long Term: Able to check pulse independently and accurately       Understanding of Exercise Prescription Yes       Intervention Provide education, explanation, and written materials on patient's individual  exercise prescription       Expected Outcomes Short Term: Able to explain program exercise prescription;Long Term: Able to explain home exercise prescription to exercise independently                Exercise Goals Re-Evaluation :  Exercise Goals Re-Evaluation     Row Name 11/16/20 1006 11/22/20 1047 12/07/20 1010         Exercise Goal Re-Evaluation   Exercise Goals Review Increase Physical Activity;Able to understand and use rate of perceived exertion (RPE) scale;Knowledge and understanding of Target Heart Rate Range (THRR);Understanding of Exercise Prescription;Increase Strength and Stamina;Able to understand and use Dyspnea scale;Able to check pulse independently Increase Physical Activity;Increase Strength and Stamina Increase Physical Activity;Increase Strength and Stamina     Comments Reviewed RPE and dyspnea scales, THR and program prescription with pt today.  Pt voiced understanding and was given a copy of goals to take home. Theresa Hale is doing well in rehab for her first couple of  sessions. She did surpass her upper HR both times she was here for rehab and will send a note to her doctor to ensure what a safe limit will be for the patient. She tolerated 25 laps on the track. Will continue to monitor. Theresa Hale is currently doing home exercise by riding on her recumbant for bike 20 minutes and walking for 20 min for a total of 40 minutes in a day. She is tolerating it well. EP will hold off on going over home exercise plan with patient until we hear back from Dr. Rockey Situ on possibly increasing patient's max HR. Patient will check it at home and knows to monitor it and slow down for now until we get further word back.     Expected Outcomes Short: Use RPE daily to regulate intensity. Long: Follow program prescription in THR. Short: Watch HR during exercise Long: Continue to increase overall MET level Short: Continue home exercise and monitor HR Long: Continue to exercise independently at home at appropriate prescription              Discharge Exercise Prescription (Final Exercise Prescription Changes):  Exercise Prescription Changes - 12/08/20 1200       Response to Exercise   Blood Pressure (Admit) 118/62    Blood Pressure (Exercise) 128/78    Blood Pressure (Exit) 118/60    Heart Rate (Admit) 81 bpm    Heart Rate (Exercise) 120 bpm    Heart Rate (Exit) 84 bpm    Rating of Perceived Exertion (Exercise) 13    Symptoms none    Duration Continue with 30 min of aerobic exercise without signs/symptoms of physical distress.    Intensity THRR unchanged      Progression   Progression Continue to progress workloads to maintain intensity without signs/symptoms of physical distress.    Average METs 2.54      Resistance Training   Training Prescription Yes    Weight 3 lb    Reps 10-15      Interval Training   Interval Training No      NuStep   Level 3    Minutes 15    METs 2.5      Track   Laps 29    Minutes 15    METs 2.58             Nutrition:  Target  Goals: Understanding of nutrition guidelines, daily intake of sodium <1574m, cholesterol <2075m calories 30% from fat and 7% or less from saturated  fats, daily to have 5 or more servings of fruits and vegetables.  Education: All About Nutrition: -Group instruction provided by verbal, written material, interactive activities, discussions, models, and posters to present general guidelines for heart healthy nutrition including fat, fiber, MyPlate, the role of sodium in heart healthy nutrition, utilization of the nutrition label, and utilization of this knowledge for meal planning. Follow up email sent as well. Written material given at graduation. Flowsheet Row Cardiac Rehab from 11/25/2020 in [facility] Cardiac and Pulmonary Rehab  Education need identified 11/10/20       Biometrics:  Pre Biometrics - 11/11/20 1158       Pre Biometrics   Height 5' 5.1" (1.654 m)    Weight 119 lb 9.6 oz (54.3 kg)    BMI (Calculated) 19.83    Single Leg Stand 28.3 seconds             Post Biometrics - 12/14/20 1005        Post  Biometrics   Height 5' 5.1" (1.654 m)    Weight 118 lb 14.4 oz (53.9 kg)    BMI (Calculated) 19.71    Single Leg Stand 13.4 seconds             Nutrition Therapy Plan and Nutrition Goals:  Nutrition Therapy & Goals - 11/23/20 0912       Nutrition Therapy   Diet Heart healthy, low Na    Drug/Food Interactions --    Protein (specify units) 40g    Fiber 25 grams    Whole Grain Foods 3 servings    Saturated Fats 12 max. grams    Fruits and Vegetables 8 servings/day    Sodium 1.5 grams      Personal Nutrition Goals   Nutrition Goal ST: reducing cheese intake to <2 servings per day, eat 1 serving of non-starchy vegetables per day, have 3 good sources of protein/day LT: maintain weight, increase vegetable servings and variety, reduce saturated fat.    Comments Her weight is stable, appetite is lower, husband reports that she barely eats, she disagrees and feels he is  overreacting. She is very particular about her food and spices; she does not do well with hidden food that she does not like as she can taste it still. Vegetables: green beans, corn, lima beans, most beans, potatoes, raw carrots. She is allergic to green peppers. She does not care for meat B: apples and cheese L: sandwich (banana sandwich or pimento cheese or egg salad) D: pizza, chicken, fish - esp. shrimp and salmon, roast, BBQ. They have been eating out a lot recently, 4-5 days per week. Drinks: 1 glass of sweet tea, 1 glass of milk (2%), the rest water. Discussed heart healthy eating.      Intervention Plan   Intervention Prescribe, educate and counsel regarding individualized specific dietary modifications aiming towards targeted core components such as weight, hypertension, lipid management, diabetes, heart failure and other comorbidities.    Expected Outcomes Short Term Goal: Understand basic principles of dietary content, such as calories, fat, sodium, cholesterol and nutrients.;Short Term Goal: A plan has been developed with personal nutrition goals set during dietitian appointment.;Long Term Goal: Adherence to prescribed nutrition plan.             Nutrition Assessments:  MEDIFICTS Score Key: ?70 Need to make dietary changes  40-70 Heart Healthy Diet ? 40 Therapeutic Level Cholesterol Diet  Flowsheet Row Cardiac Rehab from 12/14/2020 in [facility] Cardiac and Pulmonary Rehab  Picture Your Plate  Total Score on Admission 54  Picture Your Plate Total Score on Discharge 63      Picture Your Plate Scores: <43 Unhealthy dietary pattern with much room for improvement. 41-50 Dietary pattern unlikely to meet recommendations for good health and room for improvement. 51-60 More healthful dietary pattern, with some room for improvement.  >60 Healthy dietary pattern, although there may be some specific behaviors that could be improved.    Nutrition Goals Re-Evaluation:   Nutrition Goals  Discharge (Final Nutrition Goals Re-Evaluation):   Psychosocial: Target Goals: Acknowledge presence or absence of significant depression and/or stress, maximize coping skills, provide positive support system. Participant is able to verbalize types and ability to use techniques and skills needed for reducing stress and depression.   Education: Stress, Anxiety, and Depression - Group verbal and visual presentation to define topics covered.  Reviews how body is impacted by stress, anxiety, and depression.  Also discusses healthy ways to reduce stress and to treat/manage anxiety and depression.  Written material given at graduation. Flowsheet Row Cardiac Rehab from 11/25/2020 in [facility] Cardiac and Pulmonary Rehab  Date 11/25/20  Educator [facility]  Instruction Review Code 1- Verbalizes Understanding       Education: Sleep Hygiene -Provides group verbal and written instruction about how sleep can affect your health.  Define sleep hygiene, discuss sleep cycles and impact of sleep habits. Review good sleep hygiene tips.    Initial Review & Psychosocial Screening:  Initial Psych Review & Screening - 11/10/20 1109       Initial Review   Current issues with Current Sleep Concerns      Family Dynamics   Good Support System? Yes   husband     Barriers   Psychosocial barriers to participate in program There are no identifiable barriers or psychosocial needs.;The patient should benefit from training in stress management and relaxation.      Screening Interventions   Interventions Encouraged to exercise;To provide support and resources with identified psychosocial needs;Provide feedback about the scores to participant    Expected Outcomes Short Term goal: Utilizing psychosocial counselor, staff and physician to assist with identification of specific Stressors or current issues interfering with healing process. Setting desired goal for each stressor or current issue identified.;Long Term Goal: Stressors or  current issues are controlled or eliminated.;Short Term goal: Identification and review with participant of any Quality of Life or Depression concerns found by scoring the questionnaire.;Long Term goal: The participant improves quality of Life and PHQ9 Scores as seen by post scores and/or verbalization of changes             Quality of Life Scores:   Quality of Life - 12/14/20 1040       Quality of Life Scores   Health/Function Pre 20.73 %    Health/Function Post 27.33 %    Health/Function % Change 31.84 %    Socioeconomic Pre 25.63 %    Socioeconomic Post 28.75 %    Socioeconomic % Change  12.17 %    Psych/Spiritual Pre 28.43 %    Psych/Spiritual Post 27 %    Psych/Spiritual % Change -5.03 %    Family Pre 28.5 %    Family Post 28.8 %    Family % Change 1.05 %    GLOBAL Pre 24.5 %    GLOBAL Post 27.8 %    GLOBAL % Change 13.47 %            Scores of 19 and below usually indicate a poorer  quality of life in these areas.  A difference of  2-3 points is a clinically meaningful difference.  A difference of 2-3 points in the total score of the Quality of Life Index has been associated with significant improvement in overall quality of life, self-image, physical symptoms, and general health in studies assessing change in quality of life.  PHQ-9: Recent Review Flowsheet Data     Depression screen [facility] 2/9 12/14/2020 11/11/2020   Decreased Interest 0 0   Down, Depressed, Hopeless 0 0   PHQ - 2 Score 0 0   Altered sleeping 0 0   Tired, decreased energy 0 1   Change in appetite 0 0   Feeling bad or failure about yourself  0 0   Trouble concentrating 0 0   Moving slowly or fidgety/restless 0 0   Suicidal thoughts 0 0   PHQ-9 Score 0 1   Difficult doing work/chores Not difficult at all Not difficult at all      Interpretation of Total Score  Total Score Depression Severity:  1-4 = Minimal depression, 5-9 = Mild depression, 10-14 = Moderate depression, 15-19 = Moderately  severe depression, 20-27 = Severe depression   Psychosocial Evaluation and Intervention:  Psychosocial Evaluation - 11/10/20 1125       Psychosocial Evaluation & Interventions   Interventions Encouraged to exercise with the program and follow exercise prescription    Comments Theresa Hale is coming to cardiac rehab for a stent. She does have a history of anxiety but states it isn't an issue currently. She is taking her medications as she should and states her blood pressure has gotten better after the stent placement. Her sleep concern is related to her bad dreams that she has been struggling with for a while now. One dream was so bad that it put her into aflutter and she had to get an ablation. She has tried tracking her food intake and watching what she does right before bed with no improvement. She is wanting to get into the program to receive education and feel more confident in managing her heart healthy lifestyle    Expected Outcomes Short: attend cardiac rehab for education and exercise. Long: develop and maintain positive self care habits.    Continue Psychosocial Services  Follow up required by staff             Psychosocial Re-Evaluation:  Psychosocial Re-Evaluation     [facility] Name 12/07/20 1013             Psychosocial Re-Evaluation   Current issues with None Identified       Comments Theresa Hale is holding up well mentally. She has great support from her husband  and kids. She has 4 grandkids that she enjoys spending time with. She has no problems with her sleep. Overall, she is enjoying the program and is feeling stronger so far and denies any other problems at this time.       Expected Outcomes Short: Continue attendance with rehab Long: Continue to utilize exercise for stress management and posititive attitude       Interventions Encouraged to attend Cardiac Rehabilitation for the exercise       Continue Psychosocial Services  Follow up required by staff                 Psychosocial Discharge (Final Psychosocial Re-Evaluation):  Psychosocial Re-Evaluation - 12/07/20 1013       Psychosocial Re-Evaluation   Current issues with None Identified    Comments  Theresa Hale is holding up well mentally. She has great support from her husband  and kids. She has 4 grandkids that she enjoys spending time with. She has no problems with her sleep. Overall, she is enjoying the program and is feeling stronger so far and denies any other problems at this time.    Expected Outcomes Short: Continue attendance with rehab Long: Continue to utilize exercise for stress management and posititive attitude    Interventions Encouraged to attend Cardiac Rehabilitation for the exercise    Continue Psychosocial Services  Follow up required by staff             Vocational Rehabilitation: Provide vocational rehab assistance to qualifying candidates.   Vocational Rehab Evaluation & Intervention:  Vocational Rehab - 11/10/20 1107       Initial Vocational Rehab Evaluation & Intervention   Assessment shows need for Vocational Rehabilitation No             Education: Education Goals: Education classes will be provided on a variety of topics geared toward better understanding of heart health and risk factor modification. Participant will state understanding/return demonstration of topics presented as noted by education test scores.  Learning Barriers/Preferences:  Learning Barriers/Preferences - 11/10/20 1125       Learning Barriers/Preferences   Learning Barriers None    Learning Preferences None             General Cardiac Education Topics:  AED/CPR: - Group verbal and written instruction with the use of models to demonstrate the basic use of the AED with the basic ABC's of resuscitation.   Anatomy and Cardiac Procedures: - Group verbal and visual presentation and models provide information about basic cardiac anatomy and function. Reviews the testing methods  done to diagnose heart disease and the outcomes of the test results. Describes the treatment choices: Medical Management, Angioplasty, or Coronary Bypass Surgery for treating various heart conditions including Myocardial Infarction, Angina, Valve Disease, and Cardiac Arrhythmias.  Written material given at graduation. Flowsheet Row Cardiac Rehab from 11/25/2020 in [facility] Cardiac and Pulmonary Rehab  Education need identified 11/10/20       Medication Safety: - Group verbal and visual instruction to review commonly prescribed medications for heart and lung disease. Reviews the medication, class of the drug, and side effects. Includes the steps to properly store meds and maintain the prescription regimen.  Written material given at graduation.   Intimacy: - Group verbal instruction through game format to discuss how heart and lung disease can affect sexual intimacy. Written material given at graduation..   Know Your Numbers and Heart Failure: - Group verbal and visual instruction to discuss disease risk factors for cardiac and pulmonary disease and treatment options.  Reviews associated critical values for Overweight/Obesity, Hypertension, Cholesterol, and Diabetes.  Discusses basics of heart failure: signs/symptoms and treatments.  Introduces Heart Failure Zone chart for action plan for heart failure.  Written material given at graduation.   Infection Prevention: - Provides verbal and written material to individual with discussion of infection control including proper hand washing and proper equipment cleaning during exercise session. Flowsheet Row Cardiac Rehab from 11/25/2020 in [facility] Cardiac and Pulmonary Rehab  Date 11/11/20  Educator [facility]  Instruction Review Code 1- Verbalizes Understanding       Falls Prevention: - Provides verbal and written material to individual with discussion of falls prevention and safety. Flowsheet Row Cardiac Rehab from 11/25/2020 in [facility] Cardiac and Pulmonary  Rehab  Date 11/11/20  Educator [facility]  Instruction Review Code 1- Verbalizes Understanding       Other: -Provides group and verbal instruction on various topics (see comments)   Knowledge Questionnaire Score:  Knowledge Questionnaire Score - 12/14/20 1040       Knowledge Questionnaire Score   Pre Score 21/26    Post Score 26/26             Core Components/Risk Factors/Patient Goals at Admission:  Personal Goals and Risk Factors at Admission - 11/11/20 1158       Core Components/Risk Factors/Patient Goals on Admission    Weight Management Weight Maintenance;Yes    Intervention Weight Management: Develop a combined nutrition and exercise program designed to reach desired caloric intake, while maintaining appropriate intake of nutrient and fiber, sodium and fats, and appropriate energy expenditure required for the weight goal.;Weight Management: Provide education and appropriate resources to help participant work on and attain dietary goals.    Admit Weight 119 lb 9.6 oz (54.3 kg)    Goal Weight: Short Term 119 lb (54 kg)    Goal Weight: Long Term 119 lb (54 kg)    Expected Outcomes Weight Maintenance: Understanding of the daily nutrition guidelines, which includes 25-35% calories from fat, 7% or less cal from saturated fats, less than 273m cholesterol, less than 1.5gm of sodium, & 5 or more servings of fruits and vegetables daily;Short Term: Continue to assess and modify interventions until short term weight is achieved;Long Term: Adherence to nutrition and physical activity/exercise program aimed toward attainment of established weight goal    Hypertension Yes    Intervention Provide education on lifestyle modifcations including regular physical activity/exercise, weight management, moderate sodium restriction and increased consumption of fresh fruit, vegetables, and low fat dairy, alcohol moderation, and smoking cessation.;Monitor prescription use compliance.    Expected Outcomes  Short Term: Continued assessment and intervention until BP is < 140/943mHG in hypertensive participants. < 130/8090mG in hypertensive participants with diabetes, heart failure or chronic kidney disease.;Long Term: Maintenance of blood pressure at goal levels.             Education:Diabetes - Individual verbal and written instruction to review signs/symptoms of diabetes, desired ranges of glucose level fasting, after meals and with exercise. Acknowledge that pre and post exercise glucose checks will be done for 3 sessions at entry of program.   Core Components/Risk Factors/Patient Goals Review:   Goals and Risk Factor Review     Row Name 12/07/20 1013             Core Components/Risk Factors/Patient Goals Review   Personal Goals Review Weight Management/Obesity;Hypertension       Review [place] Hale doing well. She has a scale at home and checks her weight often. She is currently maintaining weight and has been around 118-120 lbs for a long time. She will note if she sees any abnormal change. BPs at home have been great as well, ranging at [id number] and 60-75-64astolic. all She is staying compliant with all of her medications.       Expected Outcomes Short: Continue monitoring BP at home Long: Continue to manage lifestyle risk factors                Core Components/Risk Factors/Patient Goals at Discharge (Final Review):   Goals and Risk Factor Review - 12/07/20 1013       Core Components/Risk Factors/Patient Goals Review   Personal Goals Review Weight Management/Obesity;Hypertension    Review [place] Hale doing well. She has  a scale at home and checks her weight often. She is currently maintaining weight and has been around 118-120 lbs for a long time. She will note if she sees any abnormal change. BPs at home have been great as well, ranging at 643-329 systolic and 51-88 diastolic. all She is staying compliant with all of her medications.    Expected Outcomes Short: Continue  monitoring BP at home Long: Continue to manage lifestyle risk factors             ITP Comments:  ITP Comments     Row Name 11/10/20 1133 11/11/20 1154 11/16/20 1006 11/17/20 0916 11/22/20 1100   ITP Comments Initial telephone orientation completed. Diagnosis can be found in [facility] 9/2. EP orientation scheduled for Thursday 9/29 at 9:30 Completed 6MWT and gym orientation. Initial ITP created and sent for review to Dr. Emily Filbert, Medical Director. First full day of exercise!  Patient was oriented to gym and equipment including functions, settings, policies, and procedures.  Patient's individual exercise prescription and treatment plan were reviewed.  All starting workloads were established based on the results of the 6 minute walk test done at initial orientation visit.  The plan for exercise progression was also introduced and progression will be customized based on patient's performance and goals. 30 day review completed. ITP sent to Dr. Emily Filbert, Medical Director of Cardiac Rehab. Continue with ITP unless changes are made by physician.  Pt just started this week. Patient exceeded HR both exercise session she was here. Reached out to Dr. Rockey Situ to see if patient THR can be changed safely or maintain where it is.    [place] Name 11/23/20 1002 12/14/20 0959         ITP Comments Completed initial RD consultation Theresa Hale graduated today from  rehab with 10 sessions completed.  Details of the patient's exercise prescription and what She needs to do in order to continue the prescription and progress were discussed with patient.  Patient was given a copy of prescription and goals.  Patient verbalized understanding.  Theresa Hale plans to continue to exercise by going to [employer].               Comments: discharge ITP

## 2020-12-14 NOTE — Progress Notes (Signed)
Discharge Progress Report  Patient Details  Name: Theresa Hale MRN: [id number] Date of Birth: 14-Mar-1955 Referring Provider:   Flowsheet Row Cardiac Rehab from 11/11/2020 in [facility] Cardiac and Pulmonary Rehab  Referring Provider Kathlyn Sacramento MD  [facility] Cardiologist: Dr. Ida Rogue        Number of Visits: 10  Reason for Discharge:  Early Exit:  Insurance  Smoking History:  Social History   Tobacco Use  Smoking Status Never  Smokeless Tobacco Never    Diagnosis:  Status post coronary artery stent placement  ADL [facility]:   Initial Exercise Prescription:  Initial Exercise Prescription - 11/11/20 1100       Date of Initial Exercise RX and Referring Provider   Date 11/11/20    Referring Provider Kathlyn Sacramento MD   Primary Cardiologist: Dr. Ida Rogue     Oxygen   Maintain Oxygen Saturation 88% or higher      Treadmill   MPH 2.5    Grade 0.5    Minutes 15    METs 3.09      NuStep   Level 3    SPM 80    Minutes 15    METs 3      Elliptical   Level 1    Speed 3.6    Minutes 15    METs 3      Prescription Details   Frequency (times per week) 2    Duration Progress to 30 minutes of continuous aerobic without signs/symptoms of physical distress      Intensity   THRR 40-80% of Max Heartrate 103-136    Ratings of Perceived Exertion 11-13    Perceived Dyspnea 0-4      Progression   Progression Continue to progress workloads to maintain intensity without signs/symptoms of physical distress.      Resistance Training   Training Prescription Yes    Weight 3 lb    Reps 10-15             Discharge Exercise Prescription (Final Exercise Prescription Changes):  Exercise Prescription Changes - 12/08/20 1200       Response to Exercise   Blood Pressure (Admit) 118/62    Blood Pressure (Exercise) 128/78    Blood Pressure (Exit) 118/60    Heart Rate (Admit) 81 bpm    Heart Rate (Exercise) 120 bpm    Heart Rate (Exit) 84 bpm    Rating of  Perceived Exertion (Exercise) 13    Symptoms none    Duration Continue with 30 min of aerobic exercise without signs/symptoms of physical distress.    Intensity THRR unchanged      Progression   Progression Continue to progress workloads to maintain intensity without signs/symptoms of physical distress.    Average METs 2.54      Resistance Training   Training Prescription Yes    Weight 3 lb    Reps 10-15      Interval Training   Interval Training No      NuStep   Level 3    Minutes 15    METs 2.5      Track   Laps 29    Minutes 15    METs 2.58             Functional Capacity:  6 Minute Walk     Row Name 11/11/20 1155 12/14/20 1005       6 Minute Walk   Phase Initial Discharge    Distance 1340 feet 1610 feet  Distance % Change -- 20.1 %    Distance Feet Change -- 270 ft    Walk Time 6 minutes 6 minutes    # of Rest Breaks 0 0    MPH 2.53 3.05    METS 3.77 4.58    RPE 9 12    VO2 Peak 13.2 16.04    Symptoms Yes (comment) No    Comments leg anerysm pulls some with walking --    Resting HR 69 bpm 77 bpm    Resting BP 146/70 142/70    Resting Oxygen Saturation  100 % --    Exercise Oxygen Saturation  during 6 min walk 99 % --    Max Ex. HR 109 bpm 146 bpm    Max Ex. BP 152/74 144/72    2 Minute Post BP 120/66 --             Psychological, QOL, Others - Outcomes: PHQ 2/9: Depression screen [facility] 2/9 12/14/2020 11/11/2020  Decreased Interest 0 0  Down, Depressed, Hopeless 0 0  PHQ - 2 Score 0 0  Altered sleeping 0 0  Tired, decreased energy 0 1  Change in appetite 0 0  Feeling bad or failure about yourself  0 0  Trouble concentrating 0 0  Moving slowly or fidgety/restless 0 0  Suicidal thoughts 0 0  PHQ-9 Score 0 1  Difficult doing work/chores Not difficult at all Not difficult at all    Quality of Life:  Quality of Life - 12/14/20 1040       Quality of Life Scores   Health/Function Pre 20.73 %    Health/Function Post 27.33 %     Health/Function % Change 31.84 %    Socioeconomic Pre 25.63 %    Socioeconomic Post 28.75 %    Socioeconomic % Change  12.17 %    Psych/Spiritual Pre 28.43 %    Psych/Spiritual Post 27 %    Psych/Spiritual % Change -5.03 %    Family Pre 28.5 %    Family Post 28.8 %    Family % Change 1.05 %    GLOBAL Pre 24.5 %    GLOBAL Post 27.8 %    GLOBAL % Change 13.47 %                Nutrition & Weight - Outcomes:  Pre Biometrics - 11/11/20 1158       Pre Biometrics   Height 5' 5.1" (1.654 m)    Weight 119 lb 9.6 oz (54.3 kg)    BMI (Calculated) 19.83    Single Leg Stand 28.3 seconds             Post Biometrics - 12/14/20 1005        Post  Biometrics   Height 5' 5.1" (1.654 m)    Weight 118 lb 14.4 oz (53.9 kg)    BMI (Calculated) 19.71    Single Leg Stand 13.4 seconds             Nutrition:  Nutrition Therapy & Goals - 11/23/20 0912       Nutrition Therapy   Diet Heart healthy, low Na    Drug/Food Interactions --    Protein (specify units) 40g    Fiber 25 grams    Whole Grain Foods 3 servings    Saturated Fats 12 max. grams    Fruits and Vegetables 8 servings/day    Sodium 1.5 grams      Personal Nutrition Goals   Nutrition  Goal ST: reducing cheese intake to <2 servings per day, eat 1 serving of non-starchy vegetables per day, have 3 good sources of protein/day LT: maintain weight, increase vegetable servings and variety, reduce saturated fat.    Comments Her weight is stable, appetite is lower, husband reports that she barely eats, she disagrees and feels he is overreacting. She is very particular about her food and spices; she does not do well with hidden food that she does not like as she can taste it still. Vegetables: green beans, corn, lima beans, most beans, potatoes, raw carrots. She is allergic to green peppers. She does not care for meat B: apples and cheese L: sandwich (banana sandwich or pimento cheese or egg salad) D: pizza, chicken, fish -  esp. shrimp and salmon, roast, BBQ. They have been eating out a lot recently, 4-5 days per week. Drinks: 1 glass of sweet tea, 1 glass of milk (2%), the rest water. Discussed heart healthy eating.      Intervention Plan   Intervention Prescribe, educate and counsel regarding individualized specific dietary modifications aiming towards targeted core components such as weight, hypertension, lipid management, diabetes, heart failure and other comorbidities.    Expected Outcomes Short Term Goal: Understand basic principles of dietary content, such as calories, fat, sodium, cholesterol and nutrients.;Short Term Goal: A plan has been developed with personal nutrition goals set during dietitian appointment.;Long Term Goal: Adherence to prescribed nutrition plan.             Nutrition Discharge:   Education Questionnaire Score:  Knowledge Questionnaire Score - 12/14/20 1040       Knowledge Questionnaire Score   Pre Score 21/26    Post Score 26/26             Goals reviewed with patient; copy given to patient.

## 2020-12-14 NOTE — Progress Notes (Signed)
Daily Session Note  Patient Details  Name: Theresa Hale MRN: [id number] Date of Birth: December 09, 1955 Referring Provider:   Flowsheet Row Cardiac Rehab from 11/11/2020 in [facility] Cardiac and Pulmonary Rehab  Referring Provider Kathlyn Sacramento MD  [facility] Cardiologist: Dr. Ida Rogue       Encounter Date: 12/14/2020  Check In:  Session Check In - 12/14/20 0958       Check-In   Supervising physician immediately available to respond to emergencies See telemetry face sheet for immediately available ER MD    Location [facility]-Cardiac & Pulmonary Rehab    Staff Present Birdie Sons, MPA, RN;Jessica Luan Pulling, MA, RCEP, CCRP, CCET;Amanda Sommer, BA, ACSM CEP, Exercise Physiologist    Virtual Visit No    Medication changes reported     No    Fall or balance concerns reported    No    Warm-up and Cool-down Performed on first and last piece of equipment    Resistance Training Performed Yes    VAD Patient? No    PAD/SET Patient? No      Pain Assessment   Currently in Pain? No/denies                Social History   Tobacco Use  Smoking Status Never  Smokeless Tobacco Never    Goals Met:  Independence with exercise equipment Exercise tolerated well No report of concerns or symptoms today Strength training completed today  Goals Unmet:  Not Applicable  Comments:  Theresa Hale graduated today from  rehab with 10 sessions completed.  Details of the patient's exercise prescription and what She needs to do in order to continue the prescription and progress were discussed with patient.  Patient was given a copy of prescription and goals.  Patient verbalized understanding.  Theresa Hale plans to continue to exercise by going to [employer].    Dr. Emily Filbert is Medical Director for [facility].  Dr. Ottie Glazier is Medical Director for [facility] Pulmonary Rehabilitation.

## 2020-12-14 NOTE — Patient Instructions (Signed)
Discharge Patient Instructions  Patient Details  Name: Theresa Hale MRN: [id number] Date of Birth: April 03, 1955 Referring Provider:  Minna Merritts, MD   Number of Visits: 10  Reason for Discharge:  Patient reached a stable level of exercise. Patient independent in their exercise. Patient has met program and personal goals. Early Exit:  Insurance  Smoking History:  Social History   Tobacco Use  Smoking Status Never  Smokeless Tobacco Never    Diagnosis:  Status post coronary artery stent placement  Initial Exercise Prescription:  Initial Exercise Prescription - 11/11/20 1100       Date of Initial Exercise RX and Referring Provider   Date 11/11/20    Referring Provider Kathlyn Sacramento MD   Primary Cardiologist: Dr. Ida Rogue     Oxygen   Maintain Oxygen Saturation 88% or higher      Treadmill   MPH 2.5    Grade 0.5    Minutes 15    METs 3.09      NuStep   Level 3    SPM 80    Minutes 15    METs 3      Elliptical   Level 1    Speed 3.6    Minutes 15    METs 3      Prescription Details   Frequency (times per week) 2    Duration Progress to 30 minutes of continuous aerobic without signs/symptoms of physical distress      Intensity   THRR 40-80% of Max Heartrate 103-136    Ratings of Perceived Exertion 11-13    Perceived Dyspnea 0-4      Progression   Progression Continue to progress workloads to maintain intensity without signs/symptoms of physical distress.      Resistance Training   Training Prescription Yes    Weight 3 lb    Reps 10-15             Discharge Exercise Prescription (Final Exercise Prescription Changes):  Exercise Prescription Changes - 12/08/20 1200       Response to Exercise   Blood Pressure (Admit) 118/62    Blood Pressure (Exercise) 128/78    Blood Pressure (Exit) 118/60    Heart Rate (Admit) 81 bpm    Heart Rate (Exercise) 120 bpm    Heart Rate (Exit) 84 bpm    Rating of Perceived Exertion (Exercise) 13     Symptoms none    Duration Continue with 30 min of aerobic exercise without signs/symptoms of physical distress.    Intensity THRR unchanged      Progression   Progression Continue to progress workloads to maintain intensity without signs/symptoms of physical distress.    Average METs 2.54      Resistance Training   Training Prescription Yes    Weight 3 lb    Reps 10-15      Interval Training   Interval Training No      NuStep   Level 3    Minutes 15    METs 2.5      Track   Laps 29    Minutes 15    METs 2.58             Functional Capacity:  6 Minute Walk     Row Name 11/11/20 1155 12/14/20 1005       6 Minute Walk   Phase Initial Discharge    Distance 1340 feet 1610 feet    Distance % Change -- 20.1 %  Distance Feet Change -- 270 ft    Walk Time 6 minutes 6 minutes    # of Rest Breaks 0 0    MPH 2.53 3.05    METS 3.77 4.58    RPE 9 12    VO2 Peak 13.2 16.04    Symptoms Yes (comment) No    Comments leg anerysm pulls some with walking --    Resting HR 69 bpm 77 bpm    Resting BP 146/70 142/70    Resting Oxygen Saturation  100 % --    Exercise Oxygen Saturation  during 6 min walk 99 % --    Max Ex. HR 109 bpm 146 bpm    Max Ex. BP 152/74 144/72    2 Minute Post BP 120/66 --             Quality of Life:  Quality of Life - 11/10/20 1130       Quality of Life   Select Quality of Life      Quality of Life Scores   Health/Function Pre 20.73 %    Socioeconomic Pre 25.63 %    Psych/Spiritual Pre 28.43 %    Family Pre 28.5 %    GLOBAL Pre 24.5 %              Nutrition & Weight - Outcomes:  Pre Biometrics - 11/11/20 1158       Pre Biometrics   Height 5' 5.1" (1.654 m)    Weight 119 lb 9.6 oz (54.3 kg)    BMI (Calculated) 19.83    Single Leg Stand 28.3 seconds             Post Biometrics - 12/14/20 1005        Post  Biometrics   Height 5' 5.1" (1.654 m)    Weight 118 lb 14.4 oz (53.9 kg)    BMI (Calculated) 19.71     Single Leg Stand 13.4 seconds             Nutrition:  Nutrition Therapy & Goals - 11/23/20 0912       Nutrition Therapy   Diet Heart healthy, low Na    Drug/Food Interactions --    Protein (specify units) 40g    Fiber 25 grams    Whole Grain Foods 3 servings    Saturated Fats 12 max. grams    Fruits and Vegetables 8 servings/day    Sodium 1.5 grams      Personal Nutrition Goals   Nutrition Goal ST: reducing cheese intake to <2 servings per day, eat 1 serving of non-starchy vegetables per day, have 3 good sources of protein/day LT: maintain weight, increase vegetable servings and variety, reduce saturated fat.    Comments Her weight is stable, appetite is lower, husband reports that she barely eats, she disagrees and feels he is overreacting. She is very particular about her food and spices; she does not do well with hidden food that she does not like as she can taste it still. Vegetables: green beans, corn, lima beans, most beans, potatoes, raw carrots. She is allergic to green peppers. She does not care for meat B: apples and cheese L: sandwich (banana sandwich or pimento cheese or egg salad) D: pizza, chicken, fish - esp. shrimp and salmon, roast, BBQ. They have been eating out a lot recently, 4-5 days per week. Drinks: 1 glass of sweet tea, 1 glass of milk (2%), the rest water. Discussed heart healthy eating.  Intervention Plan   Intervention Prescribe, educate and counsel regarding individualized specific dietary modifications aiming towards targeted core components such as weight, hypertension, lipid management, diabetes, heart failure and other comorbidities.    Expected Outcomes Short Term Goal: Understand basic principles of dietary content, such as calories, fat, sodium, cholesterol and nutrients.;Short Term Goal: A plan has been developed with personal nutrition goals set during dietitian appointment.;Long Term Goal: Adherence to prescribed nutrition plan.              Nutrition Discharge:   Education Questionnaire Score:  Knowledge Questionnaire Score - 11/10/20 1130       Knowledge Questionnaire Score   Pre Score 21/26             Goals reviewed with patient; copy given to patient.

## 2020-12-20 ENCOUNTER — Ambulatory Visit
Admission: RE | Admit: 2020-12-20 | Discharge: 2020-12-20 | Disposition: A | Discharge status: Home / Self Care | Payer: Medicare PPO | Source: Ambulatory Visit | Attending: Hematology and Oncology | Admitting: Hematology and Oncology

## 2020-12-20 ENCOUNTER — Other Ambulatory Visit: Payer: Self-pay

## 2020-12-20 DIAGNOSIS — C50411 Malignant neoplasm of upper-outer quadrant of right female breast: Secondary | ICD-10-CM | POA: Insufficient documentation

## 2020-12-20 DIAGNOSIS — Z170 Estrogen receptor positive status [ER+]: Secondary | ICD-10-CM | POA: Insufficient documentation

## 2020-12-20 DIAGNOSIS — Z1239 Encounter for other screening for malignant neoplasm of breast: Secondary | ICD-10-CM | POA: Diagnosis not present

## 2020-12-20 DIAGNOSIS — N6489 Other specified disorders of breast: Secondary | ICD-10-CM | POA: Diagnosis not present

## 2020-12-20 DIAGNOSIS — Z853 Personal history of malignant neoplasm of breast: Secondary | ICD-10-CM | POA: Diagnosis not present

## 2020-12-20 MED ORDER — GADOBUTROL 1 MMOL/ML IV SOLN
5.0000 mL | Freq: Once | INTRAVENOUS | Status: AC | PRN
  Administered 2020-12-20: 5 mL via INTRAVENOUS

## 2021-02-10 ENCOUNTER — Ambulatory Visit: Payer: Medicare PPO

## 2021-03-05 NOTE — Progress Notes (Signed)
Patient Care Team: Caryl Bis, MD as PCP - General (Family Medicine) Minna Merritts, MD as PCP - Cardiology (Cardiology) Constance Haw, MD as PCP - Electrophysiology (Cardiology)  DIAGNOSIS:    ICD-10-CM   1. Malignant neoplasm of upper-outer quadrant of right breast in female, estrogen receptor positive ([place])  C50.411    Z17.0       SUMMARY OF ONCOLOGIC HISTORY: Oncology History  Malignant neoplasm of upper-outer quadrant of right breast in female, estrogen receptor positive ([place])  10/09/2017 Initial Diagnosis   Palpable lump in the right breast with a history of right breast atypia in mother and sister with breast cancers, 1.9 cm mass at 10 o'clock position right breast biopsy 10 o'clock position 5 cm from nipple: Grade 1 invasive ductal carcinoma ER 60%, PR 40%, Ki-67 2%, HER-2 negative ratio 1.35 T1cN1a stage Ib AJCC 8   11/26/2017 Surgery   Right lumpectomy: Grade 2 IDC 1.9 cm, margins negative, 1/2 lymph nodes positive, ER 60%, PR 40%, HER-2 2+ by IHC negative by FISH ratio 1.35, copy #2.1, Ki-67 2%, T1cN1a stage IA    12/03/2017 Cancer Staging   Staging form: Breast, AJCC 8th Edition - Pathologic: Stage IA (pT1c, pN1a, cM0, G2, ER+, PR+, HER2-) - Signed by Nicholas Lose, MD on 12/03/2017    01/17/2018 - 02/25/2018 Radiation Therapy   Adj XRT at [facility] COMPLIANT: Follow-up of right breast cancer  INTERVAL HISTORY: Theresa Hale is a 66 y.o. with above-mentioned history of right breast cancer who underwent a lumpectomy, radiation, and was unable to tolerate letrozole. MRI Breast on 12/20/2020 showed no evidence of malignancy bilaterally. She presents to the clinic today for follow-up.   ALLERGIES:  is allergic to penicillins, erythromycin, sulfa antibiotics, and sulfasalazine.  MEDICATIONS:  Current Outpatient Medications  Medication Sig Dispense Refill   aspirin EC 81 MG tablet Take 1 tablet (81 mg total) by mouth daily. Swallow whole. (Patient  taking differently: Take 81 mg by mouth every evening. Swallow whole.) 90 tablet 3   Calcium 600-400 MG-UNIT CHEW Chew 1 tablet by mouth in the morning.     clopidogrel (PLAVIX) 75 MG tablet Take 1 tablet (75 mg total) by mouth daily. 90 tablet 2   pantoprazole (PROTONIX) 20 MG tablet Take 1 tablet (20 mg total) by mouth daily. 90 tablet 2   rosuvastatin (CRESTOR) 20 MG tablet Take 1 tablet (20 mg total) by mouth in the morning. 90 tablet 2   No current facility-administered medications for this visit.    PHYSICAL EXAMINATION: ECOG PERFORMANCE STATUS: 1 - Symptomatic but completely ambulatory  Vitals:   03/07/21 0947  BP: (!) 155/72  Pulse: 98  Resp: 18  Temp: (!) 97.3 F (36.3 C)  SpO2: 100%   Filed Weights   03/07/21 0947  Weight: 116 lb 9.6 oz (52.9 kg)    BREAST: No palpable masses or nodules in either right or left breasts. No palpable axillary supraclavicular or infraclavicular adenopathy no breast tenderness or nipple discharge. (exam performed in the presence of a chaperone)  LABORATORY DATA:  I have reviewed the data as listed CMP Latest Ref Rng & Units 10/16/2020 10/04/2020 09/07/2020  Glucose 70 - 99 mg/dL 97 97 86  BUN 8 - 23 mg/dL '14 17 15  ' Creatinine 0.44 - 1.00 mg/dL 0.62 0.71 0.78  Sodium 135 - 145 mmol/L 136 137 140  Potassium 3.5 - 5.1 mmol/L 4.1 3.9 4.5  Chloride 98 - 111 mmol/L 102  100 99  CO2 22 - 32 mmol/L 27 32 24  Calcium 8.9 - 10.3 mg/dL 9.1 9.5 9.5  Total Protein 6.5 - 8.1 g/dL - - -  Total Bilirubin 0.3 - 1.2 mg/dL - - -  Alkaline Phos 38 - 126 U/L - - -  AST 15 - 41 U/L - - -  ALT 0 - 44 U/L - - -    Lab Results  Component Value Date   WBC 7.7 10/16/2020   HGB 13.6 10/16/2020   HCT 37.7 10/16/2020   MCV 82.0 10/16/2020   PLT 168 10/16/2020   NEUTROABS 4.4 10/04/2020    ASSESSMENT & PLAN:  Malignant neoplasm of upper-outer quadrant of right breast in female, estrogen receptor positive ([place]) 11/26/2017 right lumpectomy: Grade 2 IDC 1.9  cm, margins negative, 1/2 lymph nodes positive, ER 60%, PR 40%, HER-2 2+ by IHC negative by FISH ratio 1.35, copy #2.1, Ki-67 2%, T1cN1a stage IA Patient had Mammaprint prior to surgery and it was low risk luminal type A Adjuvant radiation therapy at [facility] completed 02/25/2018   Antiestrogen therapy: Patient took neoadjuvant letrozole and could not tolerate it.  She does not want to take any antiestrogen therapy  Ablation Nov 2021: for A.Fib She also had 2 heart catheterizations for calcifications.   Breast cancer surveillance: 1.  Mammogram 09/09/2020: Benign breast density category D 2.  Annual breast exams: 03/07/2021: Benign 3.  Breast MRI 12/20/2020: No evidence of malignancy.  Breast density category D MRIs will be ordered on the breast every other year.   She would like her mammograms and breast MR[facility] to [facility] at [facility].  Return to clinic in 1 year for follow-up    No orders of the defined types were placed in this encounter.  The patient has a good understanding of the overall plan. she agrees with it. she will call with any problems that may develop before the next visit here.  Total time spent: 20 mins including face to face time and time spent for planning, charting and coordination of care  Rulon Eisenmenger, MD, MPH 03/07/2021  I, Thana Ates, am acting as scribe for Dr. Nicholas Lose.  I have reviewed the above documentation for accuracy and completeness, and I agree with the above.

## 2021-03-07 ENCOUNTER — Other Ambulatory Visit: Payer: Self-pay

## 2021-03-07 ENCOUNTER — Inpatient Hospital Stay: Payer: Medicare PPO | Attending: Hematology and Oncology | Admitting: Hematology and Oncology

## 2021-03-07 DIAGNOSIS — Z79899 Other long term (current) drug therapy: Secondary | ICD-10-CM | POA: Insufficient documentation

## 2021-03-07 DIAGNOSIS — I4891 Unspecified atrial fibrillation: Secondary | ICD-10-CM | POA: Diagnosis not present

## 2021-03-07 DIAGNOSIS — C50411 Malignant neoplasm of upper-outer quadrant of right female breast: Secondary | ICD-10-CM | POA: Diagnosis not present

## 2021-03-07 DIAGNOSIS — Z853 Personal history of malignant neoplasm of breast: Secondary | ICD-10-CM | POA: Diagnosis not present

## 2021-03-07 DIAGNOSIS — Z923 Personal history of irradiation: Secondary | ICD-10-CM | POA: Insufficient documentation

## 2021-03-07 DIAGNOSIS — Z170 Estrogen receptor positive status [ER+]: Secondary | ICD-10-CM

## 2021-03-07 DIAGNOSIS — Z7982 Long term (current) use of aspirin: Secondary | ICD-10-CM | POA: Insufficient documentation

## 2021-03-07 MED ORDER — ELDERBERRY 500 MG PO CAPS: 1.0000 | ORAL_CAPSULE | Freq: Every day | ORAL | Status: AC

## 2021-03-07 MED ORDER — VITAMIN D 125 MCG (5000 UT) PO CAPS: 1.0000 | ORAL_CAPSULE | Freq: Every day | ORAL | Status: AC

## 2021-03-07 MED ORDER — VITAMIN C 1000 MG PO TABS: 1000.0000 mg | ORAL_TABLET | Freq: Every day | ORAL | Status: AC

## 2021-03-07 NOTE — Assessment & Plan Note (Signed)
10/14/2019right lumpectomy: Grade 2 IDC 1.9 cm, margins negative, 1/2 lymph nodes positive, ER 60%, PR 40%, HER-2 2+ by IHC negative by FISH ratio 1.35, copy #2.1, Ki-67 2%, T1cN1a stage IA Patient had Mammaprint prior to surgery and it was low risk luminal typeA Adjuvant radiation therapy at [facility] 02/25/2018  Antiestrogen therapy: Patient took neoadjuvant letrozole and could not tolerate it. She does not want to take any antiestrogen therapy  Ablation Nov 2021: for A.Fib  Breast cancer surveillance: 1.Mammogram 09/09/2020: Benign breast density category D 2.Annual breast exams: 03/07/2021: Benign 3.Breast MRI11/08/2020: No evidence of malignancy.Breast density category D MRIs will be ordered on the breast every other year.  She would like her mammograms and breast MRIs to be done at [facility].  Return to clinic in 1 year for follow-up

## 2021-03-24 ENCOUNTER — Telehealth: Payer: Self-pay | Admitting: Cardiovascular Disease

## 2021-03-24 NOTE — Telephone Encounter (Signed)
Patient c/o Palpitations:  High priority if patient c/o lightheadedness, shortness of breath, or chest pain  How long have you had palpitations/irregular HR/ Afib? Are you having the symptoms now? Heart is racing, has taken a while to stop . Not currently having, has had a history. The last 2 days has had 2 episodes.   Are you currently experiencing lightheadedness, SOB or CP?  BP dropped yesterday 66/42  Do you have a history of afib (atrial fibrillation) or irregular heart rhythm? yes  Have you checked your BP or HR? (document readings if available):  yesterday 66/42  Are you experiencing any other symptoms? No other symptoms. States "I am fine until Im not"

## 2021-03-24 NOTE — Telephone Encounter (Signed)
Spoke with patient. She stated that yesterday she has an episode lasting around 5 mins where she felt her heart was racing and her BP was 66/42 after trying 3 times to check it as it would not register. After laying down for 5 mins it stopped. This morning her  BP was 117/80, HR was WNL and she is feeling fine.   Per OV note from Dr. Rockey Situ on 11/02/20:  Atrial flutter Underwent ablation with EP Off beta-blocker secondary to hypotension   SVT Noted on monitor Difficulty tolerating beta-blocker secondary to low blood pressure Followed by EP   Coronary artery disease with stable angina Recent stent placement to her LAD, on aspirin Plavix Referral made to cardiac rehab    Patient is not on any BP medications that we can hold and states that she drinks 'tons' of fluids daily. Patient was also seen by EP (Dr. Curt Bears) on 07/2020 and underwent an ablation. Patients 6 month follow up with Dr. Rockey Situ is scheduled on 05/03/21. Patient requesting an earlier appointment with Dr. Rockey Situ or Murray Hodgkins, NP. Was able to schedule her with Murray Hodgkins on 04/05/21. Patient will monitor BP closely and if she has another episode that does not resolve quickly she will get transport to the ED.  Patient was grateful for the call back.

## 2021-04-05 ENCOUNTER — Encounter: Payer: Self-pay | Admitting: Nurse Practitioner

## 2021-04-05 ENCOUNTER — Ambulatory Visit: Payer: Medicare PPO | Admitting: Nurse Practitioner

## 2021-04-05 ENCOUNTER — Other Ambulatory Visit: Payer: Self-pay

## 2021-04-05 VITALS — BP 138/90 | HR 89 | Ht 65.1 in | Wt 113.4 lb

## 2021-04-05 DIAGNOSIS — R002 Palpitations: Secondary | ICD-10-CM | POA: Diagnosis not present

## 2021-04-05 DIAGNOSIS — E785 Hyperlipidemia, unspecified: Secondary | ICD-10-CM

## 2021-04-05 DIAGNOSIS — I2510 Atherosclerotic heart disease of native coronary artery without angina pectoris: Secondary | ICD-10-CM

## 2021-04-05 DIAGNOSIS — K219 Gastro-esophageal reflux disease without esophagitis: Secondary | ICD-10-CM | POA: Diagnosis not present

## 2021-04-05 DIAGNOSIS — I471 Supraventricular tachycardia: Secondary | ICD-10-CM | POA: Diagnosis not present

## 2021-04-05 DIAGNOSIS — I483 Typical atrial flutter: Secondary | ICD-10-CM | POA: Diagnosis not present

## 2021-04-05 MED ORDER — FAMOTIDINE 20 MG PO TABS: 20.0000 mg | ORAL_TABLET | Freq: Every day | ORAL | 3 refills | Status: DC

## 2021-04-05 NOTE — Progress Notes (Signed)
Office Visit    Patient Name: Theresa Hale Date of Encounter: 04/05/2021  Primary Care Provider:  Caryl Bis, MD Primary Cardiologist:  Ida Rogue, MD  Chief Complaint    66 year old female with a history of atrial flutter, breast cancer, coronary artery disease, hyperlipidemia, GERD, and PSVT, who presents for follow-up related to palpitations.  Past Medical History    Past Medical History:  Diagnosis Date   Atrial flutter ([place])    a. Dx in 2018. Initially only a single episode. (CHA2DS2VASc = 2); b. 11/2020 Recurrent Aflutter s/p DCCV. Eliquis started; c. 12/2020 s/p RFCA; d. 01/2021 Eliquis d/c'd.   Breast cancer ([employer])    CAD (coronary artery disease)    a. 07/2019 Cardiac CT: Ca2+ 262 (90th %'ile); b. 09/2020 Cor CTA: Sev LAD dzs w/ abnl FFR; c. 10/2020 PCI: LM nl, LAD 80p/31m (shockwave lithotripsy + 2.5x38 Onyx DES), LCX mild diff dzs, RCA mild diff dzs.   GERD (gastroesophageal reflux disease)    History of echocardiogram    a. 06/2016 Echo: EF 60-65%, no rwma. Mild TR.   Personal history of radiation therapy 02/2018   PONV (postoperative nausea and vomiting)    PSVT (paroxysmal supraventricular tachycardia) (HCC)    a. 06/2019 Zio: RSR, avg HR 71, max HR 222. 97 runs of SVT, fastest 222 (4 beats), longest 1:05 (164); b. 07/2020 Zio: Predominantly sinus rhythm @ 67 (49-197). 42 SVT runs, fastest 197 x 6 beats, longest 11.3 secs @ 127 bpm.  Triggered events associated with sinus tachycardia.   Past Surgical History:  Procedure Laterality Date   A-FLUTTER ABLATION N/A 12/26/2019   Procedure: A-FLUTTER ABLATION;  Surgeon: Constance Haw, MD;  Location: [facility];  Service: Cardiovascular;  Laterality: N/A;   ABDOMINAL HYSTERECTOMY     ABLATION     BREAST BIOPSY Right 10/02/2017   BREAST EXCISIONAL BIOPSY Right 01/2002   BREAST LUMPECTOMY Right 2019   BREAST LUMPECTOMY WITH RADIOACTIVE SEED AND SENTINEL LYMPH NODE BIOPSY Right 11/26/2017   Procedure:  RIGHT BREAST LUMPECTOMY WITH SENTINEL NODE MAPPING AND TARGETED NODE DISECTION ERAS PATHWAY;  Surgeon: Jovita Kussmaul, MD;  Location: [facility];  Service: General;  Laterality: Right;   CESAREAN SECTION     CHOLECYSTECTOMY     CORONARY STENT INTERVENTION N/A 10/15/2020   Procedure: CORONARY STENT INTERVENTION;  Surgeon: Wellington Hampshire, MD;  Location: [facility];  Service: Cardiovascular;  Laterality: N/A;   KNEE ARTHROSCOPY     LEFT HEART CATH AND CORONARY ANGIOGRAPHY N/A 10/08/2020   Procedure: LEFT HEART CATH AND CORONARY ANGIOGRAPHY;  Surgeon: Wellington Hampshire, MD;  Location: [facility];  Service: Cardiovascular;  Laterality: N/A;   NECK SURGERY      Allergies  Allergies  Allergen Reactions   Penicillins Swelling    Reaction: Childhood   Erythromycin Nausea And Vomiting   Sulfa Antibiotics Nausea And Vomiting   Sulfasalazine Nausea And Vomiting    History of Present Illness    66 y/o ? w/ a h/o atrial flutter, breast cancer, coronary artery disease, hyperlipidemia, GERD, and PSVT.  She was previously diagnosed with atrial flutter in 2018 was initially placed on beta-blocker with subsequent conversion.  Echo in 2018 showed normal LV function and mild TR.  In the spring 2021, she underwent event monitoring in the setting of palpitations and was found to have frequent, brief runs of PSVT.  Beta-blocker usage was limited by relative hypotension and therefore,  she took on a as needed basis only.  She underwent coronary calcium scoring in June 2021, and this was elevated at 262 (90th percentile).  She was placed on rosuvastatin therapy.  In October 2021, she was seen in clinic in the setting of rapid atrial flutter which persisted despite vagal maneuvers and oral metoprolol.  She was referred to the emergency department and underwent successful cardioversion.  She was placed on Eliquis.  She was seen by Dr. Curt Bears (EP) and underwent successful catheter  ablation in November 2021.  She reported presyncope in May 2022, and underwent repeat event monitoring which showed predominantly sinus rhythm with 42 SVT runs, though again, these were relatively short-lived with a maximum duration of 11.3 seconds and a maximum heart rate of 197 bpm.  Triggered events were associated with sinus tachycardia.  In the setting of ongoing presyncope and hypotension, beta-blocker therapy was discontinued in June 2022.  Following this, she noted some elevations in blood pressure and also atypical chest pain.  Coronary CT angiogram in August 2022 showed coronary calcium score of 352 (91st percentile), mild stenosis in the OM1, and suggestion of severe stenosis in the proximal LAD.  FFR was abnormal at 0.71, and diagnostic catheterization was undertaken in September 2022 revealing 80% heavily calcified proximal LAD and 70% mid LAD stenoses.  The LAD was treated with shockwave lithotripsy and drug-eluting stent placement.  Theresa Hale was last seen in cardiology clinic in September 2022, at which time she was feeling well.  She notes that over time, she periodically has a brief episode of palpitations for the most part, had been doing well.  On February 4, she had an episode of tachypalpitations associated with presyncope while at home.  Her husband tried to get her blood pressure but the machine kept reading an error.  After about 15 minutes of vagal maneuvers, tachycardia broke and symptoms resolved.  She had a second episode on April 9 while on a recumbent bicycle at the gym.  She was able to track her heart rate with that episode and notes that it rose into the 170s.  She again had associated weakness and presyncope.  She laid down on the floor and again brought her knees to her chest and bear down and symptoms resolved within about 15 minutes.  She has not had any recurrence since February 9.  She denies chest pain, dyspnea, PND, orthopnea, syncope, edema, or early satiety.  She is not  currently interested in a retrial of low-dose beta-blocker or calcium channel blocker therapy.  Home Medications    Current Outpatient Medications  Medication Sig Dispense Refill   Ascorbic Acid (VITAMIN C) 1000 MG tablet Take 1 tablet (1,000 mg total) by mouth daily.     aspirin EC 81 MG tablet Take 1 tablet (81 mg total) by mouth daily. Swallow whole. 90 tablet 3   Calcium 600-400 MG-UNIT CHEW Chew 1 tablet by mouth in the morning.     Cholecalciferol (VITAMIN D) 125 MCG (5000 UT) CAPS Take 1 capsule by mouth daily. 30 capsule    clopidogrel (PLAVIX) 75 MG tablet Take 1 tablet (75 mg total) by mouth daily. 90 tablet 2   Elderberry 500 MG CAPS Take 1 capsule by mouth daily.     famotidine (PEPCID) 20 MG tablet Take 1 tablet (20 mg total) by mouth at bedtime. 90 tablet 3   rosuvastatin (CRESTOR) 20 MG tablet Take 1 tablet (20 mg total) by mouth in the morning. 90 tablet 2  No current facility-administered medications for this visit.     Review of Systems    Tachypalpitations and presyncope x2 over the past month.  She denies chest pain, dyspnea, PND, orthopnea, syncope, edema, or early satiety.  All other systems reviewed and are otherwise negative except as noted above.    Physical Exam    VS:  BP 138/90    Pulse 89    Ht 5' 5.1" (1.654 m)    Wt 113 lb 6.4 oz (51.4 kg)    SpO2 96%    BMI 18.81 kg/m  , BMI Body mass index is 18.81 kg/m.     GEN: Thin, in no acute distress. HEENT: normal. Neck: Supple, no JVD, carotid bruits, or masses. Cardiac: RRR, no murmurs, rubs, or gallops. No clubbing, cyanosis, edema.  Radials/PT 2+ and equal bilaterally.  Respiratory:  Respirations regular and unlabored, clear to auscultation bilaterally. GI: Soft, nontender, nondistended, BS + x 4. MS: no deformity or atrophy. Skin: warm and dry, no rash. Neuro:  Strength and sensation are intact. Psych: Normal affect.  Accessory Clinical Findings    ECG personally reviewed by me today -regular  sinus rhythm, 89, right axis deviation, biatrial enlargement- no acute changes.  Lab Results  Component Value Date   WBC 7.7 10/16/2020   HGB 13.6 10/16/2020   HCT 37.7 10/16/2020   MCV 82.0 10/16/2020   PLT 168 10/16/2020   Lab Results  Component Value Date   CREATININE 0.62 10/16/2020   BUN 14 10/16/2020   NA 136 10/16/2020   K 4.1 10/16/2020   CL 102 10/16/2020   CO2 27 10/16/2020   Lab Results  Component Value Date   ALT 28 12/04/2019   AST 35 12/04/2019   ALKPHOS 137 (H) 12/04/2019   BILITOT 1.0 12/04/2019    Assessment & Plan    1.  Tachypalpitations/PSVT/history of atrial flutter: Status post catheter ablation for atrial flutter November 2022.  She has since had intermittent tachypalpitations that occur randomly, generally fairly short-lived but often associated with presyncope.  ZIO monitoring in June 2022 showed predominantly sinus rhythm with 42 runs of SVT, the longest lasting only 11.3 seconds, with a maximum rate of 197 bpm.  Beta-blocker therapy previously discontinued in the setting of presyncope and hypotension.  Over the past month, she has had 2 recurrent episodes of tachypalpitations associated presyncope, both lasting about 15 minutes prior to resolving with vagal maneuvers.  She has had no recurrence in about 2 weeks.  We discussed options for management today.  Understandably, she is reluctant to reconsider a low-dose of an AV nodal blocking agent such as diltiazem or metoprolol.  Use of as needed metoprolol is not ideal given presyncope and documented hypotension during these episodes.  We also discussed the potential role of repeat monitoring and agreed that given relative scarcity of symptoms that a 2-week monitor is unlikely to elucidate her arrhythmia further.  She says her insurance company previously denied an effort to place a [place].  Her husband is going to purchase Kardia mobile device in order to capture arrhythmias when they occur and I will  arrange for follow-up with Dr. Curt Bears.  I stressed that in the setting of symptomatic palpitations with presyncope and hypotension, she should have a very low threshold to call EMS for recurrent symptoms.  2.  Coronary artery disease: Status post PCI and drug-eluting stent placement to the LAD in September 2022.  She has not been having any chest pain or significant  dyspnea.  She remains on aspirin, Plavix, and statin therapy.  3.  Hyperlipidemia: Lipids are followed by her primary care provider.  She is on rosuvastatin 20 mg daily.  4.  GERD: Patient previously on Nexium and this was switched to Protonix following stenting in September.  Patient has a history of osteoporosis and prefers to come off of the PPI due to potential risk of hypocalcemia.  We will switch to Pepcid 20 mg daily.  5.  Disposition: Follow-up with electrophysiology in [place].   Murray Hodgkins, NP 04/05/2021, 5:52 PM

## 2021-04-05 NOTE — Patient Instructions (Addendum)
Medication Instructions:  Your physician has recommended you make the following change in your medication:   STOP Protonix START Famotidine 20 mg daily  at bedtime.   *If you need a refill on your cardiac medications before your next appointment, please call your pharmacy*   Lab Work: None  If you have labs (blood work) drawn today and your tests are completely normal, you will receive your results only by: [place] (if you have MyChart) OR A paper copy in the mail If you have any lab test that is abnormal or we need to change your treatment, we will call you to review the results.   Testing/Procedures: None   Follow-Up: At [facility], you and your health needs are our priority.  As part of our continuing mission to provide you with exceptional heart care, we have created designated Provider Care Teams.  These Care Teams include your primary Cardiologist (physician) and Advanced Practice Providers (APPs -  Physician Assistants and Nurse Practitioners) who all work together to provide you with the care you need, when you need it.   Your next appointment:   Follow up as soon as possible with Dr. Curt Bears  The format for your next appointment:   In Person  Provider:   Dr. Allegra Lai

## 2021-04-07 NOTE — Progress Notes (Signed)
Cardiology Office Note Date:  04/08/2021  Patient ID:  Theresa Hale, Theresa Hale 04-Oct-1955, MRN [id number] PCP:  Caryl Bis, MD  Cardiologist:  Dr. Rockey Situ Electrophysiologist: Dr. Curt Bears    Chief Complaint:  near syncope/palpitations  History of Present Illness: Theresa Hale is a 66 y.o. female with history of anxiety, HTN, Aflutter (ablated), SVT, near syncope, CAD (PCI to LAD 10/15/20)  Theresa Hale comes in today to be seen for Dr. Curt Bears, last seen by him June 22, discussed episode of near syncope, some when getting out of the shower with associated low BPs, though others in different scenarios, was s/p monitoring without arrhythmias/findings to explain her syncope Metoprolol had been stopped and planned for loop  No loop, seems perhaps denied by her insurance (?)  Theresa Hale saw Dr. Rockey Situ Aug 2022 with c/o CP, planned for cath with abnormal coronary CTA Cath noted LAD disease and underwent DES 10/15/20  Most recently saw Angelica Ran, NP 04/05/21 a couple episodes of palptations associated with near syncope. Pt was very relucutant to retry nodal blockers with prior near syncope noted with hypotension. Apparently her insurance denied prior plan for loop They were going to purchase a Kardia and try to capture rhythm with symptoms With plans to f/u with EP  TODAY Theresa Hale is accompanied by her husband. Theresa Hale gives a couple examples of her lst few events: Bending over to get laundry from the dryer, sudden onset of racing heart and washed over/weakness that was profound, had to lay down and perform a few vagal maneuvers to get it to stop.  But took 15 minutes  Wed (2 days ago) seated, sudden onset of racing, fairly brief and not as symptomatic.  Same day and shortly afterwards happened again (this is the 1st time 2 in the same day) and again, not as symptomatic while seated. HR by pulse Ox was 160's  On the exercise bike last week at her baseline will get her HR generally to about 130 or so, though sudden  onset of palpitations and very weak, lightheaded, HR on the bike registered 179  The events are sudden on and sudden off, though Theresa Hale feels drained afterwards  Time line, Theresa Hale recalls about 2 years ago having a fainting spell in the kitchen, started feeling weak, lightheaded, grabbed on to the counter but fainted. Theresa Hale had XRT back in 2019 for R breast cancer (no chemo), since then Theresa Hale in the mornings is quite easily winded, just dressing can get her SOB, mostly this wears off as the day goes. Though since then Theresa Hale can not tolerate hot temperatures, will get near fainting in a hot steamy shower.  Theresa Hale can have some palpitations without feeling near syncopal, but awasrwe of them and not feeling great.  But in the last year or so, (outside of hot showers) weak spells and near syncope are always associated with the tachycardia  Her husband mentions that Theresa Hale is easily winded especially in the AM Theresa Hale dates this back to her XRT therapy, notes that when Theresa Hale is first up and getting around Theresa Hale seems to get winded doing simple tasks, though seems to wear off  as the day goes   AFlutter Hx, SVT Diagnosed 2018 CTI ablation 12/26/19 (no inducible SVTs)   Past Medical History:  Diagnosis Date   Atrial flutter ([place])    a. Dx in 2018. Initially only a single episode. (CHA2DS2VASc = 2); b. 11/2020 Recurrent Aflutter s/p DCCV. Eliquis started; c. 12/2020 s/p RFCA; d. 01/2021 Eliquis d/c'd.  Breast cancer ([place])    CAD (coronary artery disease)    a. 07/2019 Cardiac CT: Ca2+ 262 (90th %'ile); b. 09/2020 Cor CTA: Sev LAD dzs w/ abnl FFR; c. 10/2020 PCI: LM nl, LAD 80p/44m(shockwave lithotripsy + 2.5x38 Onyx DES), LCX mild diff dzs, RCA mild diff dzs.   GERD (gastroesophageal reflux disease)    History of echocardiogram    a. 06/2016 Echo: EF 60-65%, no rwma. Mild TR.   Personal history of radiation therapy 02/2018   PONV (postoperative nausea and vomiting)    PSVT (paroxysmal supraventricular tachycardia) (HCC)     a. 06/2019 Zio: RSR, avg HR 71, max HR 222. 97 runs of SVT, fastest 222 (4 beats), longest 1:05 (164); b. 07/2020 Zio: Predominantly sinus rhythm @ 67 (49-197). 42 SVT runs, fastest 197 x 6 beats, longest 11.3 secs @ 127 bpm.  Triggered events associated with sinus tachycardia.    Past Surgical History:  Procedure Laterality Date   A-FLUTTER ABLATION N/A 12/26/2019   Procedure: A-FLUTTER ABLATION;  Surgeon: CConstance Haw MD;  [facility];  Service: Cardiovascular;  Laterality: N/A;   ABDOMINAL HYSTERECTOMY     ABLATION     BREAST BIOPSY Right 10/02/2017   BREAST EXCISIONAL BIOPSY Right 01/2002   BREAST LUMPECTOMY Right 2019   BREAST LUMPECTOMY WITH RADIOACTIVE SEED AND SENTINEL LYMPH NODE BIOPSY Right 11/26/2017   Procedure: RIGHT BREAST LUMPECTOMY WITH SENTINEL NODE MAPPING AND TARGETED NODE DISECTION ERAS PATHWAY;  Surgeon: TJovita Kussmaul MD;  [place] [facility]  Service: General;  Laterality: Right;   CESAREAN SECTION     CHOLECYSTECTOMY     CORONARY STENT INTERVENTION N/A 10/15/2020   Procedure: CORONARY STENT INTERVENTION;  Surgeon: AWellington Hampshire MD;  [facility];  Service: Cardiovascular;  Laterality: N/A;   KNEE ARTHROSCOPY     LEFT HEART CATH AND CORONARY ANGIOGRAPHY N/A 10/08/2020   Procedure: LEFT HEART CATH AND CORONARY ANGIOGRAPHY;  Surgeon: AWellington Hampshire MD;  [facility];  Service: Cardiovascular;  Laterality: N/A;   NECK SURGERY      Current Outpatient Medications  Medication Sig Dispense Refill   Ascorbic Acid (VITAMIN C) 1000 MG tablet Take 1 tablet (1,000 mg total) by mouth daily.     aspirin EC 81 MG tablet Take 1 tablet (81 mg total) by mouth daily. Swallow whole. 90 tablet 3   Calcium 600-400 MG-UNIT CHEW Chew 1 tablet by mouth in the morning.     Cholecalciferol (VITAMIN D) 125 MCG (5000 UT) CAPS Take 1 capsule by mouth daily. 30 capsule    clopidogrel (PLAVIX) 75 MG tablet  Take 1 tablet (75 mg total) by mouth daily. 90 tablet 2   Elderberry 500 MG CAPS Take 1 capsule by mouth daily.     famotidine (PEPCID) 20 MG tablet Take 1 tablet (20 mg total) by mouth at bedtime. 90 tablet 3   rosuvastatin (CRESTOR) 20 MG tablet Take 1 tablet (20 mg total) by mouth in the morning. 90 tablet 2   No current facility-administered medications for this visit.    Allergies:   Penicillins, Erythromycin, Sulfa antibiotics, and Sulfasalazine   Social History:  The patient  reports that Theresa Hale has never smoked. Theresa Hale has never used smokeless tobacco. Theresa Hale reports that Theresa Hale does not drink alcohol and does not use drugs.   Family History:  The patient's family history includes Alzheimer's disease in her father; Breast cancer (age of onset: 529 in  her sister; Breast cancer (age of onset: 53) in her mother; Parkinson's disease in her father; Stroke in her mother.  ROS:  Please see the history of present illness.    All other systems are reviewed and otherwise negative.   PHYSICAL EXAM:  VS:  BP 124/90    Pulse 86    Ht '5\' 5"'  (1.651 m)    Wt 115 lb (52.2 kg)    SpO2 99%    BMI 19.14 kg/m  BMI: Body mass index is 19.14 kg/m. Well nourished, well developed, in no acute distress HEENT: normocephalic, atraumatic Neck: no JVD, carotid bruits or masses Cardiac:  RRR; no significant murmurs, no rubs, or gallops Lungs:  CTA b/l, no wheezing, rhonchi or rales Abd: soft, nontender MS: no deformity , advanced atrophy/very thin body habitus Ext: no edema Skin: warm and dry, no rash Neuro:  No gross deficits appreciated Psych: euthymic mood, full affect   EKG:  not done today   10/15/20: LHC/PCI   Mid LAD lesion is 70% stenosed.   Prox LAD lesion is 80% stenosed.   A drug-eluting stent in the proximal and mid LAD was successfully placed using a STENT ONYX FRONTIER 2.5X38.   Post intervention, there is a 0% residual stenosis.   Conclusion: Single-vessel coronary artery disease including 80%  heavily calcified proximal LAD, and 70% mid LAD stenoses. Shockwave lithotripsy to the proximal LAD using a 2.5 mm shockwave balloon at 4 ATM for 40 pulses. Successful PCI to the proximal and mid LAD with placement of a 2.5 x 38 mm Onyx DES with excellent angiographic result and TIMI-3 flow.   Recommendation: Dual antiplatelet therapy with aspirin and Plavix for 6 months, ideally longer. Ongoing aggressive secondary prevention Will observe overnight and discharge tomorrow.  2 hours bedrest. Mynx closure device used in the right femoral artery.  June 2022, monitor Patient had a min HR of 49 bpm, max HR of 197 bpm, and avg HR of 67 bpm.  Predominant underlying rhythm was Sinus Rhythm.    42 Supraventricular Tachycardia runs occurred, the run with the fastest interval lasting 6 beats with a max rate of 197 bpm, the longest lasting 11.3 secs with an avg rate of 127 bpm. Not patient triggered    Isolated SVEs were rare (<1.0%), SVE Couplets were rare (<1.0%), and SVE Triplets were rare (<1.0%). Isolated VEs were rare (<1.0%, 123), VE Couplets were rare (<1.0%,  5), and VE Triplets were rare (<1.0%, 1).   Patient triggered events associated with sinus tachycardia  12/26/2019: EPS/Ablation  CONCLUSIONS:   1. Isthmus-dependent right atrial flutter upon presentation.   2. Successful radiofrequency ablation of atrial flutter along the cavotricuspid isthmus with complete bidirectional isthmus block achieved.   3. No inducible arrhythmias following ablation.   4. No early apparent complications.   06/21/2016: TTE Study Conclusions  - Left ventricle: The cavity size was normal. Wall thickness was    normal. Systolic function was normal. The estimated ejection    fraction was in the range of 60% to 65%. Wall motion was normal;    there were no regional wall motion abnormalities. There was an    increased relative contribution of atrial contraction to    ventricular filling. Left ventricular  diastolic function    parameters were normal.  - Aortic valve: Mildly calcified annulus. Probably trileaflet.  - Mitral valve: Mildly calcified annulus.  - Tricuspid valve: There was mild regurgitation.   Recent Labs: 10/16/2020: BUN 14; Creatinine, Ser 0.62; Hemoglobin  13.6; Platelets 168; Potassium 4.1; Sodium 136  No results found for requested labs within last 8760 hours.   CrCl cannot be calculated (Patient's most recent lab result is older than the maximum 21 days allowed.).   Wt Readings from Last 3 Encounters:  04/08/21 115 lb (52.2 kg)  04/05/21 113 lb 6.4 oz (51.4 kg)  03/07/21 116 lb 9.6 oz (52.9 kg)     Other studies reviewed: Additional studies/records reviewed today include: summarized above  ASSESSMENT AND PLAN:  AFlutter Ablated, off a/c  SVT Not inducible at her EPA Sudden on/off and very symptomatic Weary of nodal blocking agents, unable to even tolerate 12.72m metoprolol 2/2 low BPS Theresa Hale does not want to consider repeat EPS/ablation Would be open to more targeted AAD, without BP lowering properties Theresa Hale got her Kardia yesterday  I have given her my email to send tracings We have re-submitted for loop auth Given Theresa Hale gets quite lightheaded with her tachycardia, I have advised her not to drive for now    CAD No anginal sounding symptoms C/w Dr. GRockey Situand team   4. SOB Will update her echo Predates he cath/PCI by years, no change post    Disposition: F/u with [facility] 3[place]4 mo, sooner if needed, pending any tracings Theresa Hale makes/sends.  Theresa Hale sees Dr. GRockey Situnext month  Current medicines are reviewed at length with the patient today.  The patient did not have any concerns regarding medicines.  SVenetia Night PA-C 04/08/2021 9:07 AM     [facility] [address] [place] [phone])  [phone](fax)

## 2021-04-08 ENCOUNTER — Other Ambulatory Visit: Payer: Self-pay

## 2021-04-08 ENCOUNTER — Encounter: Payer: Self-pay | Admitting: Physician Assistant

## 2021-04-08 ENCOUNTER — Ambulatory Visit: Payer: Medicare PPO | Admitting: Physician Assistant

## 2021-04-08 VITALS — BP 124/90 | HR 86 | Ht 65.0 in | Wt 115.0 lb

## 2021-04-08 DIAGNOSIS — I483 Typical atrial flutter: Secondary | ICD-10-CM

## 2021-04-08 DIAGNOSIS — R55 Syncope and collapse: Secondary | ICD-10-CM

## 2021-04-08 DIAGNOSIS — I2510 Atherosclerotic heart disease of native coronary artery without angina pectoris: Secondary | ICD-10-CM | POA: Diagnosis not present

## 2021-04-08 DIAGNOSIS — R0600 Dyspnea, unspecified: Secondary | ICD-10-CM

## 2021-04-08 DIAGNOSIS — I471 Supraventricular tachycardia: Secondary | ICD-10-CM

## 2021-04-08 DIAGNOSIS — R0609 Other forms of dyspnea: Secondary | ICD-10-CM

## 2021-04-08 NOTE — Patient Instructions (Addendum)
Medication Instructions:   Your physician recommends that you continue on your current medications as directed. Please refer to the Current Medication list given to you today.  *If you need a refill on your cardiac medications before your next appointment, please call your pharmacy*   Lab Work: [place]   If you have labs (blood work) drawn today and your tests are completely normal, you will receive your results only by: [place] (if you have MyChart) OR A paper copy in the mail If you have any lab test that is abnormal or we need to change your treatment, we will call you to review the results.   Testing/Procedures: Your physician has requested that you have an echocardiogram. Echocardiography is a painless test that uses sound waves to create images of your heart. It provides your doctor with information about the size and shape of your heart and how well your hearts chambers and valves are working. This procedure takes approximately one hour. There are no restrictions for this procedure.   Follow-Up: At [facility], you and your health needs are our priority.  As part of our continuing mission to provide you with exceptional heart care, we have created designated Provider Care Teams.  These Care Teams include your primary Cardiologist (physician) and Advanced Practice Providers (APPs -  Physician Assistants and Nurse Practitioners) who all work together to provide you with the care you need, when you need it.  We recommend signing up for the patient portal called "MyChart".  Sign up information is provided on this After Visit Summary.  MyChart is used to connect with patients for Virtual Visits (Telemedicine).  Patients are able to view lab/test results, encounter notes, upcoming appointments, etc.  Non-urgent messages can be sent to your provider as well.   To learn more about what you can do with MyChart, go to [email or web address].    Your next appointment:    2 month(s) ( CONTACT [facility] FOR [facility] [place] )   The format for your next appointment:   In Person  Provider:   Allegra Lai, MD{    :1  Other Instructions    NO DRIVING !!  ( WROTE IN PEN ON PAPER)

## 2021-04-14 ENCOUNTER — Telehealth: Payer: Self-pay

## 2021-04-14 ENCOUNTER — Ambulatory Visit (INDEPENDENT_AMBULATORY_CARE_PROVIDER_SITE_OTHER): Payer: Medicare PPO

## 2021-04-14 ENCOUNTER — Other Ambulatory Visit: Payer: Self-pay

## 2021-04-14 DIAGNOSIS — R0600 Dyspnea, unspecified: Secondary | ICD-10-CM | POA: Diagnosis not present

## 2021-04-14 NOTE — Telephone Encounter (Signed)
Patient sent K[facility]ia EKG strip via MyChart showing possible a-fib today at 1:57PM, rate 101. Called and spoke with patient for more information. ? ?Patient reports she felt some pressure in her chest when this occurred and it only lasted a few seconds. She had just come out from walking in the store and was sitting in the car while she took the reading. She denies any SOB, CP, dizziness, fatigue. She reports she had a similar incident about a week ago. ? ?Patient reports she is currently taking her prescribed ASA 81mg  QD. ? ?Will forward to Tommye Standard, PA-C for review. ?

## 2021-04-15 LAB — ECHOCARDIOGRAM COMPLETE: S' Lateral: 2.2 cm

## 2021-04-18 ENCOUNTER — Telehealth: Payer: Self-pay | Admitting: *Deleted

## 2021-04-18 NOTE — Telephone Encounter (Signed)
Spoke with patient and aware of results and verbalized understanding.Patient will update clinic if any changes with heart rhythm or complaints. Also discussed per Joseph Art note about Cardia reading with was no cardiac worrisome.  ?

## 2021-04-28 NOTE — Telephone Encounter (Signed)
Called pt in regards to my chart rhythm strip.  Pt reports was taking a hot shower when event occurs.  Can not tolerate hot conditions since completing chemo/radiation.  Lasted around 3 minutes and did feel as if would pass out.  Was able to sit in shower.  Pt not interested in starting a PRN med.  Reports baseline BP is low and does not want to take medication that could potentially make BP lower.  Advised to report to the ED if events continue to occur.  Pt verbalizes understanding. Will route to Provider to review.  ?

## 2021-05-03 ENCOUNTER — Ambulatory Visit: Payer: Medicare PPO | Admitting: Cardiovascular Disease

## 2021-05-04 ENCOUNTER — Other Ambulatory Visit
Admission: RE | Admit: 2021-05-04 | Discharge: 2021-05-04 | Disposition: A | Discharge status: Home / Self Care | Payer: Medicare PPO | Attending: Family Medicine | Admitting: Family Medicine

## 2021-05-04 DIAGNOSIS — E785 Hyperlipidemia, unspecified: Secondary | ICD-10-CM | POA: Insufficient documentation

## 2021-05-04 DIAGNOSIS — Z01812 Encounter for preprocedural laboratory examination: Secondary | ICD-10-CM | POA: Insufficient documentation

## 2021-05-04 DIAGNOSIS — I259 Chronic ischemic heart disease, unspecified: Secondary | ICD-10-CM | POA: Diagnosis not present

## 2021-05-04 DIAGNOSIS — I471 Supraventricular tachycardia: Secondary | ICD-10-CM | POA: Diagnosis not present

## 2021-05-04 LAB — LIPID PANEL
Cholesterol: 137 mg/dL (ref 0–200)
HDL: 80 mg/dL (ref >40)
LDL Cholesterol: 45 mg/dL (ref 0–99)
Total CHOL/HDL Ratio: 1.7 RATIO
Triglycerides: 60 mg/dL (ref <150)
VLDL: 12 mg/dL (ref 0–40)

## 2021-05-04 LAB — COMPREHENSIVE METABOLIC PANEL
ALT: 21 U/L (ref 0–44)
AST: 25 U/L (ref 15–41)
Albumin: 4.8 g/dL (ref 3.5–5.0)
Alkaline Phosphatase: 123 U/L (ref 38–126)
Anion gap: 8 (ref 5–15)
BUN: 18 mg/dL (ref 8–23)
CO2: 29 mmol/L (ref 22–32)
Calcium: 9.7 mg/dL (ref 8.9–10.3)
Chloride: 100 mmol/L (ref 98–111)
Creatinine, Ser: 0.86 mg/dL (ref 0.44–1.00)
GFR, Estimated: >60 mL/min (ref >60)
Glucose, Bld: 102 mg/dL — ABNORMAL HIGH (ref 70–99)
Potassium: 4.4 mmol/L (ref 3.5–5.1)
Sodium: 137 mmol/L (ref 135–145)
Total Bilirubin: 0.8 mg/dL (ref 0.3–1.2)
Total Protein: 7.8 g/dL (ref 6.5–8.1)

## 2021-05-04 LAB — CBC
HCT: 44.2 % (ref 36.0–46.0)
Hemoglobin: 14.7 g/dL (ref 12.0–15.0)
MCH: 27.6 pg (ref 26.0–34.0)
MCHC: 33.3 g/dL (ref 30.0–36.0)
MCV: 83.1 fL (ref 80.0–100.0)
Platelets: 187 10*3/uL (ref 150–400)
RBC: 5.32 MIL/uL — ABNORMAL HIGH (ref 3.87–5.11)
RDW: 13.4 % (ref 11.5–15.5)
WBC: 4.5 10*3/uL (ref 4.0–10.5)
nRBC: 0 % (ref 0.0–0.2)

## 2021-05-04 LAB — TSH: TSH: 3.986 u[IU]/mL (ref 0.350–4.500)

## 2021-05-05 ENCOUNTER — Ambulatory Visit: Payer: Medicare PPO | Admitting: Cardiology

## 2021-05-05 ENCOUNTER — Encounter: Payer: Self-pay | Admitting: Cardiology

## 2021-05-05 ENCOUNTER — Other Ambulatory Visit: Payer: Self-pay

## 2021-05-05 VITALS — BP 140/88 | HR 94 | Ht 65.0 in | Wt 114.8 lb

## 2021-05-05 DIAGNOSIS — I4892 Unspecified atrial flutter: Secondary | ICD-10-CM

## 2021-05-05 DIAGNOSIS — Z01812 Encounter for preprocedural laboratory examination: Secondary | ICD-10-CM

## 2021-05-05 DIAGNOSIS — Z01810 Encounter for preprocedural cardiovascular examination: Secondary | ICD-10-CM | POA: Diagnosis not present

## 2021-05-05 DIAGNOSIS — I471 Supraventricular tachycardia: Secondary | ICD-10-CM | POA: Diagnosis not present

## 2021-05-05 MED ORDER — DILTIAZEM HCL 30 MG PO TABS: ORAL_TABLET | ORAL | 0 refills | Status: DC

## 2021-05-05 MED ORDER — DILTIAZEM HCL 30 MG PO TABS: ORAL_TABLET | ORAL | 0 refills | Status: AC

## 2021-05-05 NOTE — Patient Instructions (Addendum)
Medication Instructions:  ?Your physician has recommended you make the following change in your medication:  ? ?Diltiazem '30mg'$  - 1 tablet by mouth as needed for fast heart rate. ? ? ?*If you need a refill on your cardiac medications before your next appointment, please call your pharmacy* ? ? ?Lab Work: ?None ordered. ? ?If you have labs (blood work) drawn today and your tests are completely normal, you will receive your results only by: ?MyChart Message (if you have MyChart) OR ?A paper copy in the mail ?If you have any lab test that is abnormal or we need to change your treatment, we will call you to review the results. ? ? ?Testing/Procedures: ?Your physician has recommended that you have an ablation. Catheter ablation is a medical procedure used to treat some cardiac arrhythmias (irregular heartbeats). During catheter ablation, a long, thin, flexible tube is put into a blood vessel in your groin (upper thigh), or neck. This tube is called an ablation catheter. It is then guided to your heart through the blood vessel. Radio frequency waves destroy small areas of heart tissue where abnormal heartbeats may cause an arrhythmia to start. Please see the instruction sheet given to you today.  ? ? ?Follow-Up: ?At [facility], you and your health needs are our priority.  As part of our continuing mission to provide you with exceptional heart care, we have created designated Provider Care Teams.  These Care Teams include your primary Cardiologist (physician) and Advanced Practice Providers (APPs -  Physician Assistants and Nurse Practitioners) who all work together to provide you with the care you need, when you need it. ? ?We recommend signing up for the patient portal called "MyChart".  Sign up information is provided on this After Visit Summary.  MyChart is used to connect with patients for Virtual Visits (Telemedicine).  Patients are able to view lab/test results, encounter notes, upcoming appointments, etc.   Non-urgent messages can be sent to your provider as well.   ?To learn more about what you can do with MyChart, go to [email or web address].   ? ?Your next appointment:  You have been scheduled for an SVT ablation on Wednesday, 08/10/2021 with an arrival of 630am at [place], Dr Curt Bears nurse will be in touch with complete instructions. ?You will be contacted by Dr Curt Bears scheduler to schedule follow up appointments. ?

## 2021-05-05 NOTE — Addendum Note (Signed)
Addended by: Keitha Butte E on: 05/05/2021 03:00 PM ? ? Modules accepted: Orders ? ?

## 2021-05-05 NOTE — Telephone Encounter (Signed)
Attempted phone call to pt and left voicemail message advising Ablation Instructions sent through [place].  Please call [phone] for further questions. ?

## 2021-05-05 NOTE — Progress Notes (Signed)
? ?Electrophysiology Office Note ? ? ?Date:  05/05/2021  ? ?ID:  Theresa Hale, DOB 19-Jul-1955, MRN [id number] ? ?PCP:  Caryl Bis, MD  ?Cardiologist:  Rockey Situ ?Primary Electrophysiologist:  Poetry Cerro Meredith Leeds, MD   ? ?Chief Complaint: Atrial flutter ?  ?History of Present Illness: ?Theresa Hale is a 66 y.o. female who is being seen today for the evaluation of atrial flutter at the request of Caryl Bis, MD. Presenting today for electrophysiology evaluation. ? ?She has a history significant for atrial flutter and SVT.  She also has a history of breast cancer, hypertension, elevated coronary calcium score.  She developed atrial flutter 12/04/2019.  She is now status post atrial flutter ablation 12/26/2019.  She also has a history of SVT but this could not be induced during her atrial flutter ablation.  She has unfortunately not been able to take multiple medications due to low blood pressure.  She is quite symptomatic from her SVT. ? ?Today, denies symptoms of palpitations, chest pain, shortness of breath, orthopnea, PND, lower extremity edema, claudication, dizziness, presyncope, syncope, bleeding, or neurologic sequela. The patient is tolerating medications without difficulties.  She has continued to have episodes of SVT.  At this point, she would like to have an ablation. ? ? ?Past Medical History:  ?Diagnosis Date  ? Atrial flutter ([place])   ? a. Dx in 2018. Initially only a single episode. (CHA2DS2VASc = 2); b. 11/2020 Recurrent Aflutter s/p DCCV. Eliquis started; c. 12/2020 s/p RFCA; d. 01/2021 Eliquis d/c'd.  ? Breast cancer ([place])   ? CAD (coronary artery disease)   ? a. 07/2019 Cardiac CT: Ca2+ 262 (90th %'ile); b. 09/2020 Cor CTA: Sev LAD dzs w/ abnl FFR; c. 10/2020 PCI: LM nl, LAD 80p/24m(shockwave lithotripsy + 2.5x38 Onyx DES), LCX mild diff dzs, RCA mild diff dzs.  ? GERD (gastroesophageal reflux disease)   ? History of echocardiogram   ? a. 06/2016 Echo: EF 60-65%, no rwma. Mild TR.  ? Personal  history of radiation therapy 02/2018  ? PONV (postoperative nausea and vomiting)   ? PSVT (paroxysmal supraventricular tachycardia) ([place]   ? a. 06/2019 Zio: RSR, avg HR 71, max HR 222. 97 runs of SVT, fastest 222 (4 beats), longest 1:05 (164); b. 07/2020 Zio: Predominantly sinus rhythm @ 67 (49-197). 42 SVT runs, fastest 197 x 6 beats, longest 11.3 secs @ 127 bpm.  Triggered events associated with sinus tachycardia.  ? ?Past Surgical History:  ?Procedure Laterality Date  ? A-FLUTTER ABLATION N/A 12/26/2019  ? Procedure: A-FLUTTER ABLATION;  Surgeon: CConstance Haw MD;  [facility];  Service: Cardiovascular;  Laterality: N/A;  ? ABDOMINAL HYSTERECTOMY    ? ABLATION    ? BREAST BIOPSY Right 10/02/2017  ? BREAST EXCISIONAL BIOPSY Right 01/2002  ? BREAST LUMPECTOMY Right 2019  ? BREAST LUMPECTOMY WITH RADIOACTIVE SEED AND SENTINEL LYMPH NODE BIOPSY Right 11/26/2017  ? Procedure: RIGHT BREAST LUMPECTOMY WITH SENTINEL NODE MAPPING AND TARGETED NODE DISECTION ERAS PATHWAY;  Surgeon: TJovita Kussmaul MD;  [facility]  Service: General;  Laterality: Right;  ? CESAREAN SECTION    ? CHOLECYSTECTOMY    ? CORONARY STENT INTERVENTION N/A 10/15/2020  ? Procedure: CORONARY STENT INTERVENTION;  Surgeon: AWellington Hampshire MD;  [facility];  Service: Cardiovascular;  Laterality: N/A;  ? KNEE ARTHROSCOPY    ? LEFT HEART CATH AND CORONARY ANGIOGRAPHY N/A 10/08/2020  ? Procedure: LEFT HEART CATH AND CORONARY  ANGIOGRAPHY;  Surgeon: Wellington Hampshire, MD;  Location: [place] [facility];  Service: Cardiovascular;  Laterality: N/A;  ? NECK SURGERY    ? ? ? ?Current Outpatient Medications  ?Medication Sig Dispense Refill  ? Ascorbic Acid (VITAMIN C) 1000 MG tablet Take 1 tablet (1,000 mg total) by mouth daily. (Patient taking differently: Take 1,000 mg by mouth daily as needed (Only takes when she feels like she's getting sick).)    ? aspirin EC 81 MG tablet Take 1 tablet (81 mg  total) by mouth daily. Swallow whole. 90 tablet 3  ? Calcium 600-400 MG-UNIT CHEW Chew 1 tablet by mouth in the morning.    ? Cholecalciferol (VITAMIN D) 125 MCG (5000 UT) CAPS Take 1 capsule by mouth daily. 30 capsule   ? clopidogrel (PLAVIX) 75 MG tablet Take 1 tablet (75 mg total) by mouth daily. 90 tablet 2  ? Elderberry 500 MG CAPS Take 1 capsule by mouth daily.    ? famotidine (PEPCID) 20 MG tablet Take 1 tablet (20 mg total) by mouth at bedtime. 90 tablet 3  ? rosuvastatin (CRESTOR) 20 MG tablet Take 1 tablet (20 mg total) by mouth in the morning. 90 tablet 2  ? diltiazem (CARDIZEM) 30 MG tablet Take one tablet as needed for fast heart rate 30 tablet 0  ? ?No current facility-administered medications for this visit.  ? ? ?Allergies:   Penicillins, Erythromycin, Sulfa antibiotics, and Sulfasalazine  ? ?Social History:  The patient  reports that she has never smoked. She has never used smokeless tobacco. She reports that she does not drink alcohol and does not use drugs.  ? ?Family History:  The patient's family history includes Alzheimer's disease in her father; Breast cancer (age of onset: 61) in her sister; Breast cancer (age of onset: 58) in her mother; Parkinson's disease in her father; Stroke in her mother.  ? ?ROS:  Please see the history of present illness.   Otherwise, review of systems is positive for none.   All other systems are reviewed and negative.  ? ?PHYSICAL EXAM: ?VS:  BP 140/88   Pulse 94   Ht '5\' 5"'$  (1.651 m)   Wt 114 lb 12.8 oz (52.1 kg)   SpO2 98%   BMI 19.10 kg/m?  , BMI Body mass index is 19.1 kg/m?. ?GEN: Well nourished, well developed, in no acute distress  ?HEENT: normal  ?Neck: no JVD, carotid bruits, or masses ?Cardiac: RRR; no murmurs, rubs, or gallops,no edema  ?Respiratory:  clear to auscultation bilaterally, normal work of breathing ?GI: soft, nontender, nondistended, + BS ?MS: no deformity or atrophy  ?Skin: warm and dry ?Neuro:  Strength and sensation are intact ?Psych:  euthymic mood, full affect ? ?EKG:  EKG is not ordered today. ?Personal review of the ekg ordered 04/05/21 shows sinus rhythm, rate 89 ? ?Recent Labs: ?05/04/2021: ALT 21; BUN 18; Creatinine, Ser 0.86; Hemoglobin 14.7; Platelets 187; Potassium 4.4; Sodium 137; TSH 3.986  ? ? ?Lipid Panel  ?   ?Component Value Date/Time  ? CHOL 137 05/04/2021 0839  ? TRIG 60 05/04/2021 0839  ? HDL 80 05/04/2021 0839  ? CHOLHDL 1.7 05/04/2021 0839  ? VLDL 12 05/04/2021 0839  ? [place] 45 05/04/2021 0839  ? ? ? ?Wt Readings from Last 3 Encounters:  ?05/05/21 114 lb 12.8 oz (52.1 kg)  ?04/08/21 115 lb (52.2 kg)  ?04/05/21 113 lb 6.4 oz (51.4 kg)  ?  ? ? ?Other studies Reviewed: ?Additional studies/ records that  were reviewed today include: TTE 06/21/16  ?Review of the above records today demonstrates:  ?- Left ventricle: The cavity size was normal. Wall thickness was  ?  normal. Systolic function was normal. The estimated ejection  ?  fraction was in the range of 60% to 65%. Wall motion was normal;  ?  there were no regional wall motion abnormalities. There was an  ?  increased relative contribution of atrial contraction to  ?  ventricular filling. Left ventricular diastolic function  ?  parameters were normal.  ?- Aortic valve: Mildly calcified annulus. Probably trileaflet.  ?- Mitral valve: Mildly calcified annulus.  ?- Tricuspid valve: There was mild regurgitation.  ? ?Cardiac monitor 08/13/2019 personally reviewed ?Normal sinus rhythm ?avg HR of 71 bpm. ?max HR of 222 bpm ?  ?97 Supraventricular Tachycardia runs occurred, the run with the fastest interval lasting 4 beats with a max rate of 222 bpm, the longest lasting 1 ?min 5 secs with an avg rate of 164 bpm.  ?Supraventricular Tachycardia was detected within +/- 45 seconds of symptomatic patient event(s).  ?  ?Junctional Rhythm was present.  ?  ?Isolated SVEs were rare (<1.0%), SVE Couplets were rare (<1.0%), and SVE Triplets were rare (<1.0%). Isolated VEs were ?rare (<1.0%), VE  Couplets were rare (<1.0%), and no VE Triplets were present. ? ?ASSESSMENT AND PLAN: ? ?1.  Typical atrial flutter: Status post ablation 12/26/2019.  No obvious recurrence. ? ?2.  SVT: EP study during atrial flutte

## 2021-05-06 ENCOUNTER — Encounter: Payer: Medicare PPO | Admitting: Obstetrics and Gynecology

## 2021-05-10 DIAGNOSIS — I4892 Unspecified atrial flutter: Secondary | ICD-10-CM | POA: Diagnosis not present

## 2021-05-10 DIAGNOSIS — R413 Other amnesia: Secondary | ICD-10-CM | POA: Diagnosis not present

## 2021-05-10 DIAGNOSIS — I471 Supraventricular tachycardia: Secondary | ICD-10-CM | POA: Diagnosis not present

## 2021-05-10 DIAGNOSIS — Z681 Body mass index (BMI) 19 or less, adult: Secondary | ICD-10-CM | POA: Diagnosis not present

## 2021-05-10 DIAGNOSIS — Z0001 Encounter for general adult medical examination with abnormal findings: Secondary | ICD-10-CM | POA: Diagnosis not present

## 2021-05-10 DIAGNOSIS — R0602 Shortness of breath: Secondary | ICD-10-CM | POA: Diagnosis not present

## 2021-05-10 DIAGNOSIS — K760 Fatty (change of) liver, not elsewhere classified: Secondary | ICD-10-CM | POA: Diagnosis not present

## 2021-05-30 NOTE — Progress Notes (Signed)
? ?Synopsis: Referred for dyspnea by Caryl Bis, MD ? ?Subjective:  ? ?PATIENT ID: Theresa Hale GENDER: female DOB: 07-21-1955, MRN: [id number] ? ?Chief Complaint  ?Patient presents with  ? Pulmonary Consult  ?  Referred by Dr. Gar Ponto. Pt c/o SOB for at least the past year. She is SOB if she takes a hot shower and in the mornings after she starts moving around. She noticed this after having RT for breast CA.   ? ?66yF with history atrial flutter and SVT followed by Dr. Curt Bears with plan for SVT ablation and has had AF ablation in 2021 done [facility], CAD s/p PCI, GERD, breast cancer - had right sided XRT in 2019 and 2020. Had no chemotherapy. Never did have covid-19.  ? ?She has intermittent trouble breathing, mostly in the morning right after she gets up. Steam and showers give her trouble with breathing. She thinks this has been ever since her radiation. She also had some cough after radiation - was told her cough was related to radiation. Cough is mostly gone. She has no orthopnea. No BLE swelling. Never had asthma as a kid but did have bronchitis a lot.  ? ?This feels distinct to her from her episodes of SVT/AF during which she feels lightheaded/dizzy, almost a pressure in her neck/chest.  ? ?Otherwise pertinent review of systems is negative. ? ?No one in family with lung disease save for grandmother who had asthma/emphysema ? 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 She has lived in [place] or a short time but otherwise in [place] always.  Never smoker. No vaping or marijuana. Had a woodstove many years ago.  ? ? ? ?  ?Past Medical History:  ?Diagnosis Date  ? Atrial flutter ([place])   ? a. Dx in 2018. Initially only a single episode. (CHA2DS2VASc = 2); b. 11/2020 Recurrent Aflutter s/p DCCV. Eliquis started; c. 12/2020 s/p RFCA; d. 01/2021 Eliquis d/c'd.  ? Breast cancer ([place])   ? CAD (coronary artery disease)   ? a. 07/2019 Cardiac CT: Ca2+ 262 (90th %'ile); b. 09/2020 Cor CTA: Sev LAD dzs w/ abnl  FFR; c. 10/2020 PCI: LM nl, LAD 80p/59m(shockwave lithotripsy + 2.5x38 Onyx DES), LCX mild diff dzs, RCA mild diff dzs.  ? GERD (gastroesophageal reflux disease)   ? History of echocardiogram   ? a. 06/2016 Echo: EF 60-65%, no rwma. Mild TR.  ? Personal history of radiation therapy 02/2018  ? PONV (postoperative nausea and vomiting)   ? PSVT (paroxysmal supraventricular tachycardia) ([place]   ? a. 06/2019 Zio: RSR, avg HR 71, max HR 222. 97 runs of SVT, fastest 222 (4 beats), longest 1:05 (164); b. 07/2020 Zio: Predominantly sinus rhythm @ 67 (49-197). 42 SVT runs, fastest 197 x 6 beats, longest 11.3 secs @ 127 bpm.  Triggered events associated with sinus tachycardia.  ?  ? ?Family History  ?Problem Relation Age of Onset  ? Stroke Mother   ? Breast cancer Mother [phone] ? Parkinson's disease Father   ? Alzheimer's disease Father   ? Breast cancer Sister [phone] ?  ? ?Past Surgical History:  ?Procedure Laterality Date  ? A-FLUTTER ABLATION N/A 12/26/2019  ? Procedure: A-FLUTTER ABLATION;  Surgeon: CConstance Haw MD;  [facility];  Service: Cardiovascular;  Laterality: N/A;  ? ABDOMINAL HYSTERECTOMY    ? ABLATION    ? BREAST BIOPSY Right 10/02/2017  ? BREAST EXCISIONAL BIOPSY Right 01/2002  ? BREAST LUMPECTOMY Right  2019  ? BREAST LUMPECTOMY WITH RADIOACTIVE SEED AND SENTINEL LYMPH NODE BIOPSY Right 11/26/2017  ? Procedure: RIGHT BREAST LUMPECTOMY WITH SENTINEL NODE MAPPING AND TARGETED NODE DISECTION ERAS PATHWAY;  Surgeon: Jovita Kussmaul, MD;  Location: [place];  Service: General;  Laterality: Right;  ? CESAREAN SECTION    ? CHOLECYSTECTOMY    ? CORONARY STENT INTERVENTION N/A 10/15/2020  ? Procedure: CORONARY STENT INTERVENTION;  Surgeon: Wellington Hampshire, MD;  Location: [place] [facility];  Service: Cardiovascular;  Laterality: N/A;  ? KNEE ARTHROSCOPY    ? LEFT HEART CATH AND CORONARY ANGIOGRAPHY N/A 10/08/2020  ? Procedure: LEFT HEART CATH AND CORONARY ANGIOGRAPHY;  Surgeon: Wellington Hampshire, MD;  Location: [place] [facility];  Service: Cardiovascular;  Laterality: N/A;  ? NECK SURGERY    ? ? ?Social History  ? ?Socioeconomic History  ? Marital status: Married  ?  Spouse name: Not on file  ? Number of children: Not on file  ? Years of education: Not on file  ? Highest education level: Not on file  ?Occupational History  ? Not on file  ?Tobacco Use  ? Smoking status: Never  ? Smokeless tobacco: Never  ?Vaping Use  ? Vaping Use: Never used  ?Substance and Sexual Activity  ? Alcohol use: Never  ? Drug use: Never  ? Sexual activity: Yes  ?Other Topics Concern  ? Not on file  ?Social History Narrative  ? Lives locally w/ husband. Active.  ? ?Social Determinants of Health  ? ?Financial Resource Strain: Not on file  ?Food Insecurity: Not on file  ?Transportation Needs: Not on file  ?Physical Activity: Not on file  ?Stress: Not on file  ?Social Connections: Not on file  ?Intimate Partner Violence: Not on file  ?  ? ?Allergies  ?Allergen Reactions  ? Penicillins Swelling  ?  Reaction: Childhood  ? Erythromycin Nausea And Vomiting  ? Sulfa Antibiotics Nausea And Vomiting  ? Sulfasalazine Nausea And Vomiting  ?  ? ?Outpatient Medications Prior to Visit  ?Medication Sig Dispense Refill  ? Ascorbic Acid (VITAMIN C) 1000 MG tablet Take 1 tablet (1,000 mg total) by mouth daily. (Patient taking differently: Take 1,000 mg by mouth daily as needed (Only takes when she feels like she's getting sick).)    ? aspirin EC 81 MG tablet Take 1 tablet (81 mg total) by mouth daily. Swallow whole. 90 tablet 3  ? Calcium 600-400 MG-UNIT CHEW Chew 1 tablet by mouth in the morning.    ? Cholecalciferol (VITAMIN D) 125 MCG (5000 UT) CAPS Take 1 capsule by mouth daily. 30 capsule   ? clopidogrel (PLAVIX) 75 MG tablet Take 1 tablet (75 mg total) by mouth daily. 90 tablet 2  ? diltiazem (CARDIZEM) 30 MG tablet Take one tablet as needed for fast heart rate 30 tablet 0  ? Elderberry 500 MG CAPS Take 1 capsule by mouth daily.     ? famotidine (PEPCID) 20 MG tablet Take 1 tablet (20 mg total) by mouth at bedtime. 90 tablet 3  ? rosuvastatin (CRESTOR) 20 MG tablet Take 1 tablet (20 mg total) by mouth in the morning. 90 tablet 2  ? ?No facility-administered medications prior to visit.  ? ? ? ? ? ?Objective:  ? ?Physical Exam: ? ?General appearance: 66 y.o., female, NAD, conversant  ?Eyes: anicteric sclerae; PERRL, tracking appropriately ?HENT: NCAT; MMM ?Neck: Trachea midline; no lymphadenopathy, no JVD ?Lungs: CTAB, no crackles, no wheeze, with normal respiratory  effort ?CV: RRR, no murmur  ?Abdomen: Soft, non-tender; non-distended, BS present  ?Extremities: No peripheral edema, warm ?Skin: Normal turgor and texture; no rash ?Psych: Appropriate affect ?Neuro: Alert and oriented to person and place, no focal deficit  ? ? ? ?Vitals:  ? 06/01/21 1025  ?BP: 126/74  ?Pulse: 62  ?Temp: 98 ?F (36.7 ?C)  ?TempSrc: Oral  ?SpO2: 99%  ?Weight: 114 lb (51.7 kg)  ?Height: '5\' 5"'$  (1.651 m)  ? ?99% on RA ?BMI Readings from Last 3 Encounters:  ?06/01/21 18.97 kg/m?  ?05/05/21 19.10 kg/m?  ?04/08/21 19.14 kg/m?  ? ?Wt Readings from Last 3 Encounters:  ?06/01/21 114 lb (51.7 kg)  ?05/05/21 114 lb 12.8 oz (52.1 kg)  ?04/08/21 115 lb (52.2 kg)  ? ? ? ?CBC ?   ?Component Value Date/Time  ? WBC 4.5 05/04/2021 0839  ? RBC 5.32 (H) 05/04/2021 0839  ? HGB 14.7 05/04/2021 0839  ? HCT 44.2 05/04/2021 0839  ? PLT 187 05/04/2021 0839  ? MCV 83.1 05/04/2021 0839  ? MCH 27.6 05/04/2021 0839  ? MCHC 33.3 05/04/2021 0839  ? RDW 13.4 05/04/2021 0839  ? LYMPHSABS 1.6 10/04/2020 1658  ? MONOABS 0.5 10/04/2020 1658  ? EOSABS 0.1 10/04/2020 1658  ? BASOSABS 0.0 10/04/2020 1658  ? ? ? ?Chest Imaging: ?CTA Coronaries 09/16/20 reviewed by me lung bases unremarkable save for a few small foci of scarring, mild pectus deformity ? ?Pulmonary Functions Testing Results: ?   ? View : No data to display.  ?  ?  ?  ? ? ? ? ?Echocardiogram:  ? ?TTE 04/2021: ? 1. Left ventricular ejection  fraction, by estimation, is 60 to 65%. The  ?left ventricle has normal function. The left ventricle has no regional  ?wall motion abnormalities. Left ventricular diastolic parameters are  ?indeterminate.  ? 2.

## 2021-06-01 ENCOUNTER — Encounter: Payer: Self-pay | Admitting: Student

## 2021-06-01 ENCOUNTER — Ambulatory Visit: Payer: Medicare PPO | Admitting: Student

## 2021-06-01 VITALS — BP 126/74 | HR 62 | Temp 98.0°F | Ht 65.0 in | Wt 114.0 lb

## 2021-06-01 DIAGNOSIS — R0609 Other forms of dyspnea: Secondary | ICD-10-CM

## 2021-06-01 NOTE — Patient Instructions (Signed)
-   You will be called to schedule your breathing tests ?- I'll see you in 4-6 weeks on same day as your testing! ?

## 2021-06-06 NOTE — Progress Notes (Signed)
? ?ANNUAL PREVENTATIVE CARE GYNECOLOGY  ENCOUNTER NOTE ? ?Subjective:  ?    ? Theresa Hale is a 66 y.o. G25P3003 female here for a routine annual gynecologic exam. She has a PMH of breast cancer s/p right breast lumpectomy.  The patient is not sexually active. The patient is not taking hormone replacement therapy. Patient denies post-menopausal vaginal bleeding. The patient wears seatbelts: yes. The patient participates in regular exercise: yes. Has the patient ever been transfused or tattooed?: no. The patient reports that there is not domestic violence in her life. ? ?Current complaints: Still notes moderate vaginal dryness.  Is using OTC vaginal moisturizers and coconut oil which help some.  ? ? ?  ?Gynecologic History ?No LMP recorded. Patient has had a hysterectomy. ?Contraception: status post hysterectomy and menopausal ?Last Pap: 1989.  No longer needed. No prior h/o abnormal pap smears.  ?Last mammogram: 09/09/2020. Results were: There are no findings suspicious for malignancy. Expected post lumpectomy changes involving the RIGHT breast. ?Last Colonoscopy:  6 years ago, per patient. Was normal. Performed at [facility]. ?Last Dexa Scan: 10/21/2019.  Results were: Osteoporosis, T score -3.3.   ? ? ?Obstetric History ?OB History  ?Gravida Para Term Preterm AB Living  ?'3 3 3     3  '$ ?SAB IAB Ectopic Multiple Live Births  ?        3  ?  ?# Outcome Date GA Lbr Len/2nd Weight Sex Delivery Anes PTL Lv  ?3 Term 09/11/87    F CS-Unspec   LIV  ?2 Term 03/07/85    M CS-Unspec   LIV  ?1 Term 08/18/83    F CS-Unspec   LIV  ? ? ?Past Medical History:  ?Diagnosis Date  ? Atrial flutter ([place])   ? a. Dx in 2018. Initially only a single episode. (CHA2DS2VASc = 2); b. 11/2020 Recurrent Aflutter s/p DCCV. Eliquis started; c. 12/2020 s/p RFCA; d. 01/2021 Eliquis d/c'd.  ? Breast cancer ([place])   ? CAD (coronary artery disease)   ? a. 07/2019 Cardiac CT: Ca2+ 262 (90th %'ile); b. 09/2020 Cor CTA: Sev LAD dzs w/ abnl FFR; c. 10/2020 PCI: LM  nl, LAD 80p/75m(shockwave lithotripsy + 2.5x38 Onyx DES), LCX mild diff dzs, RCA mild diff dzs.  ? GERD (gastroesophageal reflux disease)   ? History of echocardiogram   ? a. 06/2016 Echo: EF 60-65%, no rwma. Mild TR.  ? Personal history of radiation therapy 02/2018  ? PONV (postoperative nausea and vomiting)   ? PSVT ([place]ox[place]   ? a. 06/2019 Zio: RSR, avg HR 71, max HR 222. 97 runs of SVT, fastest 222 (4 beats), longest 1:05 (164); b. 07/2020 Zio: Predominantly sinus rhythm @ 67 (49-197). 42 SVT runs, fastest 197 x 6 beats, longest 11.3 secs @ 127 bpm.  Triggered events associated with sinus tachycardia.  ? ? ?Family History  ?Problem Relation Age of Onset  ? Stroke Mother   ? Breast cancer Mother [phone] ? Parkinson's disease Father   ? Alzheimer's disease Father   ? Breast cancer Sister [phone] ? ? ?Past Surgical History:  ?Procedure Laterality Date  ? A-FLUTTER ABLATION N/A 12/26/2019  ? Procedure: A-FLUTTER ABLATION;  Surgeon: CConstance Haw MD;  [facility];  Service: Cardiovascular;  Laterality: N/A;  ? ABDOMINAL HYSTERECTOMY    ? ABLATION    ? BREAST BIOPSY Right 10/02/2017  ? BREAST EXCISIONAL BIOPSY Right 01/2002  ? BREAST LUMPECTOMY Right 2019  ? BREAST  LUMPECTOMY WITH RADIOACTIVE SEED AND SENTINEL LYMPH NODE BIOPSY Right 11/26/2017  ? Procedure: RIGHT BREAST LUMPECTOMY WITH SENTINEL NODE MAPPING AND TARGETED NODE DISECTION ERAS PATHWAY;  Surgeon: Jovita Kussmaul, MD;  [place];  Service: General;  Laterality: Right;  ? CESAREAN SECTION    ? CHOLECYSTECTOMY    ? CORONARY STENT INTERVENTION N/A 10/15/2020  ? Procedure: CORONARY STENT INTERVENTION;  Surgeon: Wellington Hampshire, MD;  Location: [place] [facility];  Service: Cardiovascular;  Laterality: N/A;  ? KNEE ARTHROSCOPY    ? LEFT HEART CATH AND CORONARY ANGIOGRAPHY N/A 10/08/2020  ? Procedure: LEFT HEART CATH AND CORONARY ANGIOGRAPHY;  Surgeon: Wellington Hampshire, MD;  Location:  [place] [facility];  Service: Cardiovascular;  Laterality: N/A;  ? NECK SURGERY    ? ? ?Social History  ? ?Socioeconomic History  ? Marital status: Married  ?  Spouse name: Not on file  ? Number of children: Not on file  ? Years of education: Not on file  ? Highest education level: Not on file  ?Occupational History  ? Not on file  ?Tobacco Use  ? Smoking status: Never  ? Smokeless tobacco: Never  ?Vaping Use  ? Vaping Use: Never used  ?Substance and Sexual Activity  ? Alcohol use: Never  ? Drug use: Never  ? Sexual activity: Yes  ?Other Topics Concern  ? Not on file  ?Social History Narrative  ? Lives locally w/ husband. Active.  ? ?Social Determinants of Health  ? ?Financial Resource Strain: Not on file  ?Food Insecurity: Not on file  ?Transportation Needs: Not on file  ?Physical Activity: Not on file  ?Stress: Not on file  ?Social Connections: Not on file  ?Intimate Partner Violence: Not on file  ? ? ?Current Outpatient Medications on File Prior to Visit  ?Medication Sig Dispense Refill  ? Ascorbic Acid (VITAMIN C) 1000 MG tablet Take 1 tablet (1,000 mg total) by mouth daily. (Patient taking differently: Take 1,000 mg by mouth daily as needed (Only takes when she feels like she's getting sick).)    ? aspirin EC 81 MG tablet Take 1 tablet (81 mg total) by mouth daily. Swallow whole. 90 tablet 3  ? Calcium 600-400 MG-UNIT CHEW Chew 1 tablet by mouth in the morning.    ? Cholecalciferol (VITAMIN D) 125 MCG (5000 UT) CAPS Take 1 capsule by mouth daily. 30 capsule   ? clopidogrel (PLAVIX) 75 MG tablet Take 1 tablet (75 mg total) by mouth daily. 90 tablet 2  ? diltiazem (CARDIZEM) 30 MG tablet Take one tablet as needed for fast heart rate 30 tablet 0  ? Elderberry 500 MG CAPS Take 1 capsule by mouth daily.    ? famotidine (PEPCID) 20 MG tablet Take 1 tablet (20 mg total) by mouth at bedtime. 90 tablet 3  ? rosuvastatin (CRESTOR) 20 MG tablet Take 1 tablet (20 mg total) by mouth in the morning. 90 tablet 2  ? ?No  current facility-administered medications on file prior to visit.  ? ? ?Allergies  ?Allergen Reactions  ? Penicillins Swelling  ?  Reaction: Childhood  ? Erythromycin Nausea And Vomiting  ? Sulfa Antibiotics Nausea And Vomiting  ? Sulfasalazine Nausea And Vomiting  ? ? ? ? ?Review of Systems ?ROS ?Review of Systems - General ROS: negative for - chills, fatigue, fever, hot flashes, night sweats, weight gain or weight loss ?Psychological ROS: negative for - anxiety, decreased libido, depression, mood swings, physical abuse or sexual  abuse ?Ophthalmic ROS: negative for - blurry vision, eye pain or loss of vision ?ENT ROS: negative for - headaches, hearing change, visual changes or vocal changes ?Allergy and Immunology ROS: negative for - hives, itchy/watery eyes or seasonal allergies ?Hematological and Lymphatic ROS: negative for - bleeding problems, bruising, swollen lymph nodes or weight loss ?Endocrine ROS: negative for - galactorrhea, hair pattern changes, hot flashes, malaise/lethargy, mood swings, palpitations, polydipsia/polyuria, skin changes, temperature intolerance or unexpected weight changes ?Breast ROS: negative for - new or changing breast lumps or nipple discharge ?Respiratory ROS: negative for - cough or shortness of breath ?Cardiovascular ROS: negative for - chest pain, irregular heartbeat, palpitations or shortness of breath ?Gastrointestinal ROS: no abdominal pain, change in bowel habits, or black or bloody stools ?Genito-Urinary ROS: no dysuria, trouble voiding, or hematuria. Positive for vaginal dryness.  ?Musculoskeletal ROS: negative for - joint pain or joint stiffness ?Neurological ROS: negative for - bowel and bladder control changes ?Dermatological ROS: negative for rash and skin lesion changes ?  ?Objective:  ? ?BP (!) 165/61   Pulse 61   Resp 16   Ht '5\' 5"'$  (1.651 m)   Wt 114 lb 6.4 oz (51.9 kg)   BMI 19.04 kg/m?  Body mass index is 19.04 kg/m?. ? ?CONSTITUTIONAL: Well-developed,  well-nourished female in no acute distress.  ?PSYCHIATRIC: Normal mood and affect. Normal behavior. Normal judgment and thought content. ?[place]: Alert and oriented to person, place, and time. Normal muscle tone

## 2021-06-06 NOTE — Patient Instructions (Signed)
Preventive Care 7 Years and Older, Female ?Preventive care refers to lifestyle choices and visits with your health care provider that can promote health and wellness. Preventive care visits are also called wellness exams. ?What can I expect for my preventive care visit? ?Counseling ?Your health care provider may ask you questions about your: ?Medical history, including: ?Past medical problems. ?Family medical history. ?Pregnancy and menstrual history. ?History of falls. ?Current health, including: ?Memory and ability to understand (cognition). ?Emotional well-being. ?Home life and relationship well-being. ?Sexual activity and sexual health. ?Lifestyle, including: ?Alcohol, nicotine or tobacco, and drug use. ?Access to firearms. ?Diet, exercise, and sleep habits. ?Work and work Statistician. ?Sunscreen use. ?Safety issues such as seatbelt and bike helmet use. ?Physical exam ?Your health care provider will check your: ?Height and weight. These may be used to calculate your BMI (body mass index). BMI is a measurement that tells if you are at a healthy weight. ?Waist circumference. This measures the distance around your waistline. This measurement also tells if you are at a healthy weight and may help predict your risk of certain diseases, such as type 2 diabetes and high blood pressure. ?Heart rate and blood pressure. ?Body temperature. ?Skin for abnormal spots. ?What immunizations do I need? ? ?Vaccines are usually given at various ages, according to a schedule. Your health care provider will recommend vaccines for you based on your age, medical history, and lifestyle or other factors, such as travel or where you work. ?What tests do I need? ?Screening ?Your health care provider may recommend screening tests for certain conditions. This may include: ?Lipid and cholesterol levels. ?Hepatitis C test. ?Hepatitis B test. ?HIV (human immunodeficiency virus) test. ?STI (sexually transmitted infection) testing, if you are at  risk. ?Lung cancer screening. ?Colorectal cancer screening. ?Diabetes screening. This is done by checking your blood sugar (glucose) after you have not eaten for a while (fasting). ?Mammogram. Talk with your health care provider about how often you should have regular mammograms. ?BRCA-related cancer screening. This may be done if you have a family history of breast, ovarian, tubal, or peritoneal cancers. ?Bone density scan. This is done to screen for osteoporosis. ?Talk with your health care provider about your test results, treatment options, and if necessary, the need for more tests. ?Follow these instructions at home: ?Eating and drinking ? ?Eat a diet that includes fresh fruits and vegetables, whole grains, lean protein, and low-fat dairy products. Limit your intake of foods with high amounts of sugar, saturated fats, and salt. ?Take vitamin and mineral supplements as recommended by your health care provider. ?Do not drink alcohol if your health care provider tells you not to drink. ?If you drink alcohol: ?Limit how much you have to 0-1 drink a day. ?Know how much alcohol is in your drink. In the [place] one drink equals one 12 oz bottle of beer (355 mL), one 5 oz glass of wine (148 mL), or one 1? oz glass of hard liquor (44 mL). ?Lifestyle ?Brush your teeth every morning and night with fluoride toothpaste. Floss one time each day. ?Exercise for at least 30 minutes 5 or more days each week. ?Do not use any products that contain nicotine or tobacco. These products include cigarettes, chewing tobacco, and vaping devices, such as e-cigarettes. If you need help quitting, ask your health care provider. ?Do not use drugs. ?If you are sexually active, practice safe sex. Use a condom or other form of protection in order to prevent STIs. ?Take aspirin only as told by  your health care provider. Make sure that you understand how much to take and what form to take. Work with your health care provider to find out whether it  is safe and beneficial for you to take aspirin daily. ?Ask your health care provider if you need to take a cholesterol-lowering medicine (statin). ?Find healthy ways to manage stress, such as: ?Meditation, yoga, or listening to music. ?Journaling. ?Talking to a trusted person. ?Spending time with friends and family. ?Minimize exposure to UV radiation to reduce your risk of skin cancer. ?Safety ?Always wear your seat belt while driving or riding in a vehicle. ?Do not drive: ?If you have been drinking alcohol. Do not ride with someone who has been drinking. ?When you are tired or distracted. ?While texting. ?If you have been using any mind-altering substances or drugs. ?Wear a helmet and other protective equipment during sports activities. ?If you have firearms in your house, make sure you follow all gun safety procedures. ?What's next? ?Visit your health care provider once a year for an annual wellness visit. ?Ask your health care provider how often you should have your eyes and teeth checked. ?Stay up to date on all vaccines. ?This information is not intended to replace advice given to you by your health care provider. Make sure you discuss any questions you have with your health care provider. ?Document Revised: 07/28/2020 Document Reviewed: 07/28/2020 ?Elsevier Patient Education ? [place]. ? ?

## 2021-06-08 ENCOUNTER — Ambulatory Visit (INDEPENDENT_AMBULATORY_CARE_PROVIDER_SITE_OTHER): Payer: Medicare PPO | Admitting: Obstetrics and Gynecology

## 2021-06-08 ENCOUNTER — Encounter: Payer: Self-pay | Admitting: Obstetrics and Gynecology

## 2021-06-08 VITALS — BP 165/61 | HR 61 | Resp 16 | Ht 65.0 in | Wt 114.4 lb

## 2021-06-08 DIAGNOSIS — N952 Postmenopausal atrophic vaginitis: Secondary | ICD-10-CM

## 2021-06-08 DIAGNOSIS — Z853 Personal history of malignant neoplasm of breast: Secondary | ICD-10-CM

## 2021-06-08 DIAGNOSIS — N895 Stricture and atresia of vagina: Secondary | ICD-10-CM

## 2021-06-08 DIAGNOSIS — Z01419 Encounter for gynecological examination (general) (routine) without abnormal findings: Secondary | ICD-10-CM

## 2021-06-08 DIAGNOSIS — M810 Age-related osteoporosis without current pathological fracture: Secondary | ICD-10-CM | POA: Diagnosis not present

## 2021-06-08 DIAGNOSIS — Z1231 Encounter for screening mammogram for malignant neoplasm of breast: Secondary | ICD-10-CM

## 2021-06-17 ENCOUNTER — Encounter: Payer: Self-pay | Admitting: Cardiology

## 2021-06-30 ENCOUNTER — Other Ambulatory Visit: Payer: Self-pay | Admitting: Hematology and Oncology

## 2021-06-30 DIAGNOSIS — Z1231 Encounter for screening mammogram for malignant neoplasm of breast: Secondary | ICD-10-CM

## 2021-07-14 ENCOUNTER — Ambulatory Visit: Payer: Medicare PPO | Admitting: Cardiology

## 2021-07-19 NOTE — Progress Notes (Signed)
Synopsis: Referred for dyspnea by Caryl Bis, MD  Subjective:   PATIENT ID: Theresa Hale GENDER: female DOB: [birth year], MRN: [id number]  Chief Complaint  Patient presents with   Follow-up    PFT done today. Breathing has improved some.    66yF with history atrial flutter and SVT followed by Dr. Curt Bears with plan for SVT ablation and has had AF ablation in 2021 done at [facility], CAD s/p PCI, GERD, breast cancer - had right sided XRT in 2019 and 2020. Had no chemotherapy. Never did have covid-19.   She has intermittent trouble breathing, mostly in the morning right after she gets up. Steam and showers give her trouble with breathing. She thinks this has been ever since her radiation. She also had some cough after radiation - was told her cough was related to radiation. Cough is mostly gone. She has no orthopnea. No BLE swelling. Never had asthma as a kid but did have bronchitis a lot.   This feels distinct to her from her episodes of SVT/AF during which she feels lightheaded/dizzy, almost a pressure in her neck/chest.   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 She has lived in [place] or a short time but otherwise in [place] always.  Never smoker. No vaping or marijuana. Had a woodstove many years ago.    Interval HPI:  PFTs today normal  She still notes DOE walking up stairs, hills and with steam in shower. About the same as at last visit. Sometimes there is associated feeling of heart racing, throat tightness, sometimes has neither of these associated symptoms.    She has no cough.   Otherwise pertinent review of systems is negative.   Past Medical History:  Diagnosis Date   Atrial flutter ([place])    a. Dx in 2018. Initially only a single episode. (CHA2DS2VASc = 2); b. 11/2020 Recurrent Aflutter s/p DCCV. Eliquis started; c. 12/2020 s/p RFCA; d. 01/2021 Eliquis d/c'd.   Breast cancer ([place])     CAD (coronary artery disease)    a. 07/2019 Cardiac CT: Ca2+ 262 (90th %'ile); b. 09/2020 Cor CTA: Sev LAD dzs w/ abnl FFR; c. 10/2020 PCI: LM nl, LAD 80p/83m(shockwave lithotripsy + 2.5x38 Onyx DES), LCX mild diff dzs, RCA mild diff dzs.   GERD (gastroesophageal reflux disease)    History of echocardiogram    a. 06/2016 Echo: EF 60-65%, no rwma. Mild TR.   Personal history of radiation therapy 02/2018   PONV (postoperative nausea and vomiting)    PSVT (paroxysmal supraventricular tachycardia) ([facility])    a. 06/2019 Zio: RSR, avg HR 71, max HR 222. 97 runs of SVT, fastest 222 (4 beats), longest 1:05 (164); b. 07/2020 Zio: Predominantly sinus rhythm @ 67 (49-197). 42 SVT runs, fastest 197 x 6 beats, longest 11.3 secs @ 127 bpm.  Triggered events associated with sinus tachycardia.     Family History  Problem Relation Age of Onset   Stroke Mother    Breast cancer Mother [phone]  Parkinson's disease Father    Alzheimer's disease Father    Breast cancer Sister [phone]    Past Surgical History:  Procedure Laterality Date   A-FLUTTER ABLATION N/A 12/26/2019   Procedure: A-FLUTTER ABLATION;  Surgeon: CConstance Haw MD;  [facility];  Service: Cardiovascular;  Laterality: N/A;   ABDOMINAL HYSTERECTOMY     ABLATION  BREAST BIOPSY Right 10/02/2017   BREAST EXCISIONAL BIOPSY Right 01/2002   BREAST LUMPECTOMY Right 2019   BREAST LUMPECTOMY WITH RADIOACTIVE SEED AND SENTINEL LYMPH NODE BIOPSY Right 11/26/2017   Procedure: RIGHT BREAST LUMPECTOMY WITH SENTINEL NODE MAPPING AND TARGETED NODE DISECTION ERAS PATHWAY;  Surgeon: Jovita Kussmaul, MD;  Location: [place];  Service: General;  Laterality: Right;   CESAREAN SECTION     CHOLECYSTECTOMY     CORONARY STENT INTERVENTION N/A 10/15/2020   Procedure: CORONARY STENT INTERVENTION;  Surgeon: Wellington Hampshire, MD;  Location: [place] [facility];  Service: Cardiovascular;  Laterality: N/A;   KNEE ARTHROSCOPY     LEFT  HEART CATH AND CORONARY ANGIOGRAPHY N/A 10/08/2020   Procedure: LEFT HEART CATH AND CORONARY ANGIOGRAPHY;  Surgeon: Wellington Hampshire, MD;  Location: [place] [facility];  Service: Cardiovascular;  Laterality: N/A;   NECK SURGERY      Social History   Socioeconomic History   Marital status: Married    Spouse name: Not on file   Number of children: Not on file   Years of education: Not on file   Highest education level: Not on file  Occupational History   Not on file  Tobacco Use   Smoking status: Never   Smokeless tobacco: Never  Vaping Use   Vaping Use: Never used  Substance and Sexual Activity   Alcohol use: Never   Drug use: Never   Sexual activity: Yes  Other Topics Concern   Not on file  Social History Narrative   Lives locally w/ husband. Active.   Social Determinants of Health   Financial Resource Strain: Not on file  Food Insecurity: Not on file  Transportation Needs: Not on file  Physical Activity: Not on file  Stress: Not on file  Social Connections: Not on file  Intimate Partner Violence: Not on file     Allergies  Allergen Reactions   Penicillins Swelling    Reaction: Childhood   Erythromycin Nausea And Vomiting   Sulfa Antibiotics Nausea And Vomiting   Sulfasalazine Nausea And Vomiting     Outpatient Medications Prior to Visit  Medication Sig Dispense Refill   Ascorbic Acid (VITAMIN C) 1000 MG tablet Take 1 tablet (1,000 mg total) by mouth daily. (Patient taking differently: Take 1,000 mg by mouth daily as needed (Only takes when she feels like she's getting sick).)     aspirin EC 81 MG tablet Take 1 tablet (81 mg total) by mouth daily. Swallow whole. 90 tablet 3   Calcium 600-400 MG-UNIT CHEW Chew 1 tablet by mouth in the morning.     Cholecalciferol (VITAMIN D) 125 MCG (5000 UT) CAPS Take 1 capsule by mouth daily. 30 capsule    clopidogrel (PLAVIX) 75 MG tablet Take 1 tablet (75 mg total) by mouth daily. 90 tablet 2   diltiazem (CARDIZEM) 30 MG  tablet Take one tablet as needed for fast heart rate 30 tablet 0   Elderberry 500 MG CAPS Take 1 capsule by mouth daily.     famotidine (PEPCID) 20 MG tablet Take 1 tablet (20 mg total) by mouth at bedtime. 90 tablet 3   rosuvastatin (CRESTOR) 20 MG tablet Take 1 tablet (20 mg total) by mouth in the morning. 90 tablet 2   No facility-administered medications prior to visit.       Objective:   Physical Exam:  General appearance: 66 y.o., female, NAD, conversant  Eyes: anicteric sclerae; PERRL, tracking appropriately HENT: NCAT; MMM  Neck: Trachea midline; no lymphadenopathy, no JVD Lungs: CTAB, no crackles, no wheeze, with normal respiratory effort CV: RRR, no murmur  Abdomen: Soft, non-tender; non-distended, BS present  Extremities: No peripheral edema, warm Skin: Normal turgor and texture; no rash Psych: Appropriate affect Neuro: Alert and oriented to person and place, no focal deficit     Vitals:   07/21/21 1010  BP: 122/80  Pulse: 80  Temp: 98.5 F (36.9 C)  TempSrc: Oral  SpO2: 99%  Weight: 114 lb (51.7 kg)  Height: '5\' 5"'$  (1.651 m)    99% on RA BMI Readings from Last 3 Encounters:  07/21/21 18.97 kg/m  06/08/21 19.04 kg/m  06/01/21 18.97 kg/m   Wt Readings from Last 3 Encounters:  07/21/21 114 lb (51.7 kg)  06/08/21 114 lb 6.4 oz (51.9 kg)  06/01/21 114 lb (51.7 kg)     CBC    Component Value Date/Time   WBC 4.5 05/04/2021 0839   RBC 5.32 (H) 05/04/2021 0839   HGB 14.7 05/04/2021 0839   HCT 44.2 05/04/2021 0839   PLT 187 05/04/2021 0839   MCV 83.1 05/04/2021 0839   MCH 27.6 05/04/2021 0839   MCHC 33.3 05/04/2021 0839   RDW 13.4 05/04/2021 0839   LYMPHSABS 1.6 10/04/2020 1658   MONOABS 0.5 10/04/2020 1658   EOSABS 0.1 10/04/2020 1658   BASOSABS 0.0 10/04/2020 1658     Chest Imaging: CTA Coronaries 09/16/20 reviewed by me lung bases unremarkable save for a few small foci of scarring, mild pectus deformity  Pulmonary Functions Testing  Results:    Latest Ref Rng & Units 07/21/2021    8:49 AM  PFT Results  FVC-Pre L 2.32  P  FVC-Predicted Pre % 72  P  FVC-Post L 2.32  P  FVC-Predicted Post % 72  P  Pre FEV1/FVC % % 74  P  Post FEV1/FCV % % 81  P  FEV1-Pre L 1.72  P  FEV1-Predicted Pre % 71  P  FEV1-Post L 1.88  P  DLCO uncorrected ml/min/mmHg 18.28  P  DLCO UNC% % 90  P  DLCO corrected ml/min/mmHg 18.28  P  DLCO COR %Predicted % 90  P  DLVA Predicted % 120  P  TLC L 4.72  P  TLC % Predicted % 90  P  RV % Predicted % 104  P    P Preliminary result   Reviewed by me normal   Echocardiogram:   TTE 04/2021:  1. Left ventricular ejection fraction, by estimation, is 60 to 65%. The  left ventricle has normal function. The left ventricle has no regional  wall motion abnormalities. Left ventricular diastolic parameters are  indeterminate.   2. Right ventricular systolic function is normal. The right ventricular  size is normal. There is normal pulmonary artery systolic pressure. The  estimated right ventricular systolic pressure is 02.5 mmHg.   3. The mitral valve was not well visualized. No evidence of mitral valve  regurgitation. No evidence of mitral stenosis.   4. The aortic valve was not well visualized. Aortic valve regurgitation  is not visualized. No aortic stenosis is present.       Assessment & Plan:   # Episodic dyspnea: # DOE Dyspnea during shower or with high humidity could be bronchospasm equivalent. There's really not much at all in way of any structural lung disease from radiation in the past. PFTs with borderline BD response. Asthma, NAEB remain possibilities.  Plan: - declined xopenex inhaler prn - opts to  hold off on therapeutic trial of ICS for now. She wishes to revisit this after her SVT ablation to first see what role her SVT is playing in all her symptoms. She'll send a message if she'd like to try any of these inhalers before our next visit.   RTC 3 months  Maryjane Hurter,  MD [facility] 07/21/2021 10:19 AM

## 2021-07-20 ENCOUNTER — Telehealth: Payer: Self-pay | Admitting: *Deleted

## 2021-07-20 ENCOUNTER — Encounter: Payer: Self-pay | Admitting: *Deleted

## 2021-07-20 NOTE — Telephone Encounter (Signed)
Called pt to see how she was doing prior to upcoming procedure/ablation on 6/28. Reports she is doing well, no complaints at this time. Reviewed procedure instructions and aware I will send new instruction letter via mychart. She would like to go to [facility] for lab work instead of driving to [place].  Aware I will update lab orders. She will stop by the medical mall there b/t now and 6/21 for labs. Patient verbalized understanding and agreeable to plan.

## 2021-07-20 NOTE — Addendum Note (Signed)
Addended by: Stanton Kidney on: 07/20/2021 12:04 PM   Modules accepted: Orders

## 2021-07-21 ENCOUNTER — Ambulatory Visit: Payer: Medicare PPO | Admitting: Student

## 2021-07-21 ENCOUNTER — Ambulatory Visit (INDEPENDENT_AMBULATORY_CARE_PROVIDER_SITE_OTHER): Payer: Medicare PPO | Admitting: Student

## 2021-07-21 ENCOUNTER — Encounter: Payer: Self-pay | Admitting: Student

## 2021-07-21 VITALS — BP 122/80 | HR 80 | Temp 98.5°F | Ht 65.0 in | Wt 114.0 lb

## 2021-07-21 DIAGNOSIS — R0609 Other forms of dyspnea: Secondary | ICD-10-CM

## 2021-07-21 LAB — PULMONARY FUNCTION TEST
DL/VA % pred: 120 %
DL/VA: 4.97 ml/min/mmHg/L
DLCO cor % pred: 90 %
DLCO cor: 18.28 ml/min/mmHg
DLCO unc % pred: 90 %
DLCO unc: 18.28 ml/min/mmHg
FEF 25-75 Post: 1.79 L/sec
FEF 25-75 Pre: 1.3 L/sec
FEF2575-%Change-Post: 37 %
FEF2575-%Pred-Post: 87 %
FEF2575-%Pred-Pre: 63 %
FEV1-%Change-Post: 9 %
FEV1-%Pred-Post: 77 %
FEV1-%Pred-Pre: 71 %
FEV1-Post: 1.88 L
FEV1-Pre: 1.72 L
FEV1FVC-%Change-Post: 8 %
FEV1FVC-%Pred-Pre: 97 %
FEV6-%Change-Post: 0 %
FEV6-%Pred-Post: 75 %
FEV6-%Pred-Pre: 75 %
FEV6-Post: 2.32 L
FEV6-Pre: 2.32 L
FEV6FVC-%Pred-Post: 104 %
FEV6FVC-%Pred-Pre: 104 %
FVC-%Change-Post: 0 %
FVC-%Pred-Post: 72 %
FVC-%Pred-Pre: 72 %
FVC-Post: 2.32 L
FVC-Pre: 2.32 L
Post FEV1/FVC ratio: 81 %
Post FEV6/FVC ratio: 100 %
Pre FEV1/FVC ratio: 74 %
Pre FEV6/FVC Ratio: 100 %
RV % pred: 104 %
RV: 2.3 L
TLC % pred: 90 %
TLC: 4.72 L

## 2021-07-21 NOTE — Patient Instructions (Addendum)
-   We can always consider trying a steroid inhaler like flovent for about 8 weeks to see if this helps. If so it could be that you either have asthma or nonasthmatic eosinophilic bronchitis. The latter could be related to issues with reflux and trouble swallowing that you've had. It could also be worth treating reflux with proton pump inhibitor like nexium or protonix for 8 weeks as well.

## 2021-07-21 NOTE — Patient Instructions (Signed)
Full PFT Performed Today  

## 2021-07-21 NOTE — Progress Notes (Signed)
Full PFT Performed Today  

## 2021-07-25 ENCOUNTER — Other Ambulatory Visit
Admission: RE | Admit: 2021-07-25 | Discharge: 2021-07-25 | Disposition: A | Discharge status: Home / Self Care | Payer: Medicare PPO | Attending: Cardiology | Admitting: Cardiology

## 2021-07-25 DIAGNOSIS — Z01812 Encounter for preprocedural laboratory examination: Secondary | ICD-10-CM | POA: Diagnosis not present

## 2021-07-25 DIAGNOSIS — I4892 Unspecified atrial flutter: Secondary | ICD-10-CM | POA: Insufficient documentation

## 2021-07-25 DIAGNOSIS — I471 Supraventricular tachycardia: Secondary | ICD-10-CM | POA: Insufficient documentation

## 2021-07-25 LAB — BASIC METABOLIC PANEL
Anion gap: 8 (ref 5–15)
BUN: 16 mg/dL (ref 8–23)
CO2: 30 mmol/L (ref 22–32)
Calcium: 9.7 mg/dL (ref 8.9–10.3)
Chloride: 100 mmol/L (ref 98–111)
Creatinine, Ser: 0.69 mg/dL (ref 0.44–1.00)
GFR, Estimated: >60 mL/min (ref >60)
Glucose, Bld: 74 mg/dL (ref 70–99)
Potassium: 3.8 mmol/L (ref 3.5–5.1)
Sodium: 138 mmol/L (ref 135–145)

## 2021-07-25 LAB — CBC
HCT: 44.3 % (ref 36.0–46.0)
Hemoglobin: 14.8 g/dL (ref 12.0–15.0)
MCH: 28.3 pg (ref 26.0–34.0)
MCHC: 33.4 g/dL (ref 30.0–36.0)
MCV: 84.7 fL (ref 80.0–100.0)
Platelets: 174 10*3/uL (ref 150–400)
RBC: 5.23 MIL/uL — ABNORMAL HIGH (ref 3.87–5.11)
RDW: 13.2 % (ref 11.5–15.5)
WBC: 4.3 10*3/uL (ref 4.0–10.5)
nRBC: 0 % (ref 0.0–0.2)

## 2021-08-03 ENCOUNTER — Other Ambulatory Visit: Payer: Medicare PPO

## 2021-08-09 ENCOUNTER — Telehealth: Payer: Self-pay | Admitting: Cardiology

## 2021-08-10 ENCOUNTER — Ambulatory Visit (HOSPITAL_BASED_OUTPATIENT_CLINIC_OR_DEPARTMENT_OTHER): Payer: Medicare PPO | Admitting: Anesthesiology

## 2021-08-10 ENCOUNTER — Ambulatory Visit (HOSPITAL_COMMUNITY)
Admission: RE | Admit: 2021-08-10 | Discharge: 2021-08-10 | Disposition: A | Discharge status: Home / Self Care | Payer: Medicare PPO | Attending: Cardiology | Admitting: Cardiology

## 2021-08-10 ENCOUNTER — Ambulatory Visit (HOSPITAL_COMMUNITY): Payer: Medicare PPO | Admitting: Anesthesiology

## 2021-08-10 ENCOUNTER — Other Ambulatory Visit: Payer: Self-pay

## 2021-08-10 ENCOUNTER — Encounter (HOSPITAL_COMMUNITY): Payer: Self-pay | Admitting: Cardiology

## 2021-08-10 ENCOUNTER — Encounter (HOSPITAL_COMMUNITY)
Admission: RE | Disposition: A | Discharge status: Home / Self Care | Payer: Self-pay | Source: Home / Self Care | Attending: Cardiology

## 2021-08-10 DIAGNOSIS — Z853 Personal history of malignant neoplasm of breast: Secondary | ICD-10-CM | POA: Diagnosis not present

## 2021-08-10 DIAGNOSIS — I471 Supraventricular tachycardia: Secondary | ICD-10-CM | POA: Insufficient documentation

## 2021-08-10 DIAGNOSIS — I10 Essential (primary) hypertension: Secondary | ICD-10-CM | POA: Insufficient documentation

## 2021-08-10 DIAGNOSIS — I2510 Atherosclerotic heart disease of native coronary artery without angina pectoris: Secondary | ICD-10-CM

## 2021-08-10 DIAGNOSIS — I4892 Unspecified atrial flutter: Secondary | ICD-10-CM | POA: Diagnosis not present

## 2021-08-10 DIAGNOSIS — K219 Gastro-esophageal reflux disease without esophagitis: Secondary | ICD-10-CM | POA: Diagnosis not present

## 2021-08-10 HISTORY — PX: SVT ABLATION: EP1225

## 2021-08-10 SURGERY — SVT ABLATION
Anesthesia: Monitor Anesthesia Care

## 2021-08-10 MED ORDER — ONDANSETRON HCL 4 MG/2ML IJ SOLN: 4.0000 mg | Freq: Four times a day (QID) | INTRAMUSCULAR | Status: DC | PRN

## 2021-08-10 MED ORDER — HEPARIN (PORCINE) IN NACL 1000-0.9 UT/500ML-% IV SOLN
INTRAVENOUS | Status: DC | PRN
  Administered 2021-08-10 (×2): 500 mL

## 2021-08-10 MED ORDER — FENTANYL CITRATE (PF) 250 MCG/5ML IJ SOLN
INTRAMUSCULAR | Status: DC | PRN
  Administered 2021-08-10 (×4): 25 ug via INTRAVENOUS

## 2021-08-10 MED ORDER — PROPOFOL 500 MG/50ML IV EMUL
INTRAVENOUS | Status: DC | PRN
  Administered 2021-08-10: 50 ug/kg/min via INTRAVENOUS

## 2021-08-10 MED ORDER — LIDOCAINE 2% (20 MG/ML) 5 ML SYRINGE
INTRAMUSCULAR | Status: DC | PRN
  Administered 2021-08-10: 20 mg via INTRAVENOUS

## 2021-08-10 MED ORDER — ISOPROTERENOL HCL 0.2 MG/ML IJ SOLN
INTRAVENOUS | Status: DC | PRN
  Administered 2021-08-10: 2 ug/min via INTRAVENOUS

## 2021-08-10 MED ORDER — DOBUTAMINE INFUSION FOR EP/ECHO/NUC (1000 MCG/ML)
INTRAVENOUS | Status: AC
  Filled 2021-08-10: qty 250

## 2021-08-10 MED ORDER — BUPIVACAINE HCL (PF) 0.25 % IJ SOLN
INTRAMUSCULAR | Status: AC
  Filled 2021-08-10: qty 60

## 2021-08-10 MED ORDER — ISOPROTERENOL HCL 0.2 MG/ML IJ SOLN
INTRAMUSCULAR | Status: AC
  Filled 2021-08-10: qty 5

## 2021-08-10 MED ORDER — HEPARIN (PORCINE) IN NACL 1000-0.9 UT/500ML-% IV SOLN
INTRAVENOUS | Status: AC
  Filled 2021-08-10: qty 500

## 2021-08-10 MED ORDER — SODIUM CHLORIDE 0.9% FLUSH: 3.0000 mL | INTRAVENOUS | Status: DC | PRN

## 2021-08-10 MED ORDER — SODIUM CHLORIDE 0.9 % IV SOLN: 250.0000 mL | INTRAVENOUS | Status: DC | PRN

## 2021-08-10 MED ORDER — PHENYLEPHRINE 80 MCG/ML (10ML) SYRINGE FOR IV PUSH (FOR BLOOD PRESSURE SUPPORT)
PREFILLED_SYRINGE | INTRAVENOUS | Status: DC | PRN
  Administered 2021-08-10: 40 ug via INTRAVENOUS

## 2021-08-10 MED ORDER — SODIUM CHLORIDE 0.9 % IV SOLN: INTRAVENOUS | Status: DC

## 2021-08-10 MED ORDER — BUPIVACAINE HCL (PF) 0.25 % IJ SOLN
INTRAMUSCULAR | Status: DC | PRN
  Administered 2021-08-10: 40 mL

## 2021-08-10 MED ORDER — ACETAMINOPHEN 325 MG PO TABS: 650.0000 mg | ORAL_TABLET | ORAL | Status: DC | PRN

## 2021-08-10 SURGICAL SUPPLY — 14 items
BAG SNAP BAND KOVER 36X36 (MISCELLANEOUS) ×1 IMPLANT
BLANKET WARM UNDERBOD FULL ACC (MISCELLANEOUS) ×1 IMPLANT
CATH EZ STEER NAV 4MM D-F CUR (ABLATOR) ×2 IMPLANT
CATH JOSEPH QUAD ALLRED 6F REP (CATHETERS) ×2 IMPLANT
CATH WEB BI DIR CSDF CRV REPRO (CATHETERS) ×1 IMPLANT
CLOSURE PERCLOSE PROSTYLE (VASCULAR PRODUCTS) ×4 IMPLANT
PACK EP LATEX FREE (CUSTOM PROCEDURE TRAY) ×2
PACK EP LF (CUSTOM PROCEDURE TRAY) ×1 IMPLANT
PAD DEFIB RADIO PHYSIO CONN (PAD) ×2 IMPLANT
PATCH CARTO3 (PAD) ×1 IMPLANT
SHEATH PINNACLE 6F 10CM (SHEATH) ×2 IMPLANT
SHEATH PINNACLE 7F 10CM (SHEATH) ×1 IMPLANT
SHEATH PINNACLE 8F 10CM (SHEATH) ×1 IMPLANT
SHEATH PROBE COVER 6X72 (BAG) ×1 IMPLANT

## 2021-08-10 NOTE — Transfer of Care (Signed)
Immediate Anesthesia Transfer of Care Note  Patient: Theresa Hale  Procedure(s) Performed: SVT ABLATION  Patient Location: PACU and Cath Lab  Anesthesia Type:MAC  Level of Consciousness: awake, alert , oriented and patient cooperative  Airway & Oxygen Therapy: Patient Spontanous Breathing  Post-op Assessment: Report given to RN, Post -op Vital signs reviewed and stable and Patient moving all extremities X 4  Post vital signs: Reviewed and stable  Last Vitals:  Vitals Value Taken Time  BP 133/65 08/10/21 1024  Temp 36.4 C 08/10/21 1022  Pulse 66 08/10/21 1026  Resp 15 08/10/21 1026  SpO2 100 % 08/10/21 1026  Vitals shown include unvalidated device data.  Last Pain:  Vitals:   08/10/21 1022  TempSrc: Temporal  PainSc: 0-No pain         Complications: There were no known notable events for this encounter.

## 2021-08-10 NOTE — Anesthesia Preprocedure Evaluation (Signed)
Anesthesia Evaluation  Patient identified by MRN, date of birth, ID band Patient awake    Reviewed: Allergy & Precautions, NPO status , Patient's Chart, lab work & pertinent test results  History of Anesthesia Complications (+) PONV and history of anesthetic complications  Airway Mallampati: II  TM Distance: >3 FB Neck ROM: Full    Dental no notable dental hx. (+) Dental Advisory Given   Pulmonary neg pulmonary ROS,    Pulmonary exam normal        Cardiovascular + CAD  Normal cardiovascular exam+ dysrhythmias Supra Ventricular Tachycardia      Neuro/Psych negative neurological ROS  negative psych ROS   GI/Hepatic Neg liver ROS, GERD  ,  Endo/Other  negative endocrine ROS  Renal/GU negative Renal ROS  negative genitourinary   Musculoskeletal negative musculoskeletal ROS (+)   Abdominal   Peds negative pediatric ROS (+)  Hematology negative hematology ROS (+)   Anesthesia Other Findings   Reproductive/Obstetrics negative OB ROS                             Anesthesia Physical  Anesthesia Plan  ASA: III  Anesthesia Plan: MAC   Post-op Pain Management: Minimal or no pain anticipated   Induction: Intravenous  PONV Risk Score and Plan: Ondansetron, Treatment may vary due to age or medical condition, Propofol infusion and Midazolam  Airway Management Planned: Simple Face Mask  Additional Equipment:   Intra-op Plan:   Post-operative Plan:   Informed Consent: I have reviewed the patients History and Physical, chart, labs and discussed the procedure including the risks, benefits and alternatives for the proposed anesthesia with the patient or authorized representative who has indicated his/her understanding and acceptance.     Dental advisory given  Plan Discussed with: CRNA, Anesthesiologist and Surgeon  Anesthesia Plan Comments:         Anesthesia Quick Evaluation

## 2021-08-10 NOTE — Progress Notes (Signed)
Pure wick placed-pt educated

## 2021-08-10 NOTE — H&P (Signed)
Electrophysiology Office Note   Date:  08/10/2021   ID:  Theresa Hale, DOB [birth year], MRN [id number]  PCP:  Caryl Bis, MD  Cardiologist:  Rockey Situ Primary Electrophysiologist:  Dalene Robards Meredith Leeds, MD    Chief Complaint: Atrial flutter   History of Present Illness: Theresa Hale is a 66 y.o. female who is being seen today for the evaluation of atrial flutter at the request of No ref. provider found. Presenting today for electrophysiology evaluation.  She has a history significant for atrial flutter and SVT.  She also has a history of breast cancer, hypertension, elevated coronary calcium score.  She developed atrial flutter 12/04/2019.  She is now status post atrial flutter ablation 12/26/2019.  She also has a history of SVT but this could not be induced during her atrial flutter ablation.  She has unfortunately not been able to take multiple medications due to low blood pressure.  She is quite symptomatic from her SVT.  Today, denies symptoms of palpitations, chest pain, shortness of breath, orthopnea, PND, lower extremity edema, claudication, dizziness, presyncope, syncope, bleeding, or neurologic sequela. The patient is tolerating medications without difficulties. Plan SVT ablation today.    Past Medical History:  Diagnosis Date   Atrial flutter ([place])    a. Dx in 2018. Initially only a single episode. (CHA2DS2VASc = 2); b. 11/2020 Recurrent Aflutter s/p DCCV. Eliquis started; c. 12/2020 s/p RFCA; d. 01/2021 Eliquis d/c'd.   Breast cancer ([place])    CAD (coronary artery disease)    a. 07/2019 Cardiac CT: Ca2+ 262 (90th %'ile); b. 09/2020 Cor CTA: Sev LAD dzs w/ abnl FFR; c. 10/2020 PCI: LM nl, LAD 80p/89m(shockwave lithotripsy + 2.5x38 Onyx DES), LCX mild diff dzs, RCA mild diff dzs.   GERD (gastroesophageal reflux disease)    History of echocardiogram    a. 06/2016 Echo: EF 60-65%, no rwma. Mild TR.   Personal history of radiation therapy 02/2018   PONV (postoperative nausea and  vomiting)    PSVT (paroxysmal supraventricular tachycardia) ([facility])    a. 06/2019 Zio: RSR, avg HR 71, max HR 222. 97 runs of SVT, fastest 222 (4 beats), longest 1:05 (164); b. 07/2020 Zio: Predominantly sinus rhythm @ 67 (49-197). 42 SVT runs, fastest 197 x 6 beats, longest 11.3 secs @ 127 bpm.  Triggered events associated with sinus tachycardia.   Past Surgical History:  Procedure Laterality Date   A-FLUTTER ABLATION N/A 12/26/2019   Procedure: A-FLUTTER ABLATION;  Surgeon: CConstance Haw MD;  [facility];  Service: Cardiovascular;  Laterality: N/A;   ABDOMINAL HYSTERECTOMY     ABLATION     BREAST BIOPSY Right 10/02/2017   BREAST EXCISIONAL BIOPSY Right 01/2002   BREAST LUMPECTOMY Right 2019   BREAST LUMPECTOMY WITH RADIOACTIVE SEED AND SENTINEL LYMPH NODE BIOPSY Right 11/26/2017   Procedure: RIGHT BREAST LUMPECTOMY WITH SENTINEL NODE MAPPING AND TARGETED NODE DISECTION ERAS PATHWAY;  Surgeon: TJovita Kussmaul MD;  [facility]  Service: General;  Laterality: Right;   CESAREAN SECTION     CHOLECYSTECTOMY     CORONARY STENT INTERVENTION N/A 10/15/2020   Procedure: CORONARY STENT INTERVENTION;  Surgeon: AWellington Hampshire MD;  [facility];  Service: Cardiovascular;  Laterality: N/A;   KNEE ARTHROSCOPY     LEFT HEART CATH AND CORONARY ANGIOGRAPHY N/A 10/08/2020   Procedure: LEFT HEART CATH AND CORONARY ANGIOGRAPHY;  Surgeon: AWellington Hampshire MD;  [facility];  Service:  Cardiovascular;  Laterality: N/A;   NECK SURGERY       Current Facility-Administered Medications  Medication Dose Route Frequency Provider Last Rate Last Admin   0.9 %  sodium chloride infusion   Intravenous Continuous Constance Haw, MD 50 mL/hr at 08/10/21 0625 New Bag at 08/10/21 0625    Allergies:   Penicillins, Erythromycin, Sulfa antibiotics, and Sulfasalazine   Social History:  The patient  reports that she has never smoked. She  has never used smokeless tobacco. She reports that she does not drink alcohol and does not use drugs.   Family History:  The patient's family history includes Alzheimer's disease in her father; Breast cancer (age of onset: 49) in her sister; Breast cancer (age of onset: 46) in her mother; Parkinson's disease in her father; Stroke in her mother.   ROS:  Please see the history of present illness.   Otherwise, review of systems is positive for none.   All other systems are reviewed and negative.   PHYSICAL EXAM: VS:  BP (!) 158/75   Pulse 89   Temp 98.2 F (36.8 C) (Temporal)   Resp 18   Ht '5\' 5"'$  (1.651 m)   Wt 51.3 kg   SpO2 99%   BMI 18.80 kg/m  , BMI Body mass index is 18.8 kg/m. GEN: Well nourished, well developed, in no acute distress  HEENT: normal  Neck: no JVD, carotid bruits, or masses Cardiac: RRR; no murmurs, rubs, or gallops,no edema  Respiratory:  clear to auscultation bilaterally, normal work of breathing GI: soft, nontender, nondistended, + BS MS: no deformity or atrophy  Skin: warm and dry Neuro:  Strength and sensation are intact Psych: euthymic mood, full affect  Recent Labs: 05/04/2021: ALT 21; TSH 3.986 07/25/2021: BUN 16; Creatinine, Ser 0.69; Hemoglobin 14.8; Platelets 174; Potassium 3.8; Sodium 138    Lipid Panel     Component Value Date/Time   CHOL 137 05/04/2021 0839   TRIG 60 05/04/2021 0839   HDL 80 05/04/2021 0839   CHOLHDL 1.7 05/04/2021 0839   VLDL 12 05/04/2021 0839   LDLCALC 45 05/04/2021 0839     Wt Readings from Last 3 Encounters:  08/10/21 51.3 kg  07/21/21 51.7 kg  06/08/21 51.9 kg      Other studies Reviewed: Additional studies/ records that were reviewed today include: TTE 06/21/16  Review of the above records today demonstrates:  - Left ventricle: The cavity size was normal. Wall thickness was    normal. Systolic function was normal. The estimated ejection    fraction was in the range of 60% to 65%. Wall motion was normal;     there were no regional wall motion abnormalities. There was an    increased relative contribution of atrial contraction to    ventricular filling. Left ventricular diastolic function    parameters were normal.  - Aortic valve: Mildly calcified annulus. Probably trileaflet.  - Mitral valve: Mildly calcified annulus.  - Tricuspid valve: There was mild regurgitation.   Cardiac monitor 08/13/2019 personally reviewed Normal sinus rhythm avg HR of 71 bpm. max HR of 222 bpm   97 Supraventricular Tachycardia runs occurred, the run with the fastest interval lasting 4 beats with a max rate of 222 bpm, the longest lasting 1 min 5 secs with an avg rate of 164 bpm.  Supraventricular Tachycardia was detected within +/- 45 seconds of symptomatic patient event(s).    Junctional Rhythm was present.    Isolated SVEs were rare (<1.0%), SVE Couplets  were rare (<1.0%), and SVE Triplets were rare (<1.0%). Isolated VEs were rare (<1.0%), VE Couplets were rare (<1.0%), and no VE Triplets were present.  ASSESSMENT AND PLAN:  1.  Typical atrial flutter: Status post ablation 12/26/2019.  No obvious recurrence.  2.  SVT: Theresa Hale has presented today for surgery, with the diagnosis of SVT.  The various methods of treatment have been discussed with the patient and family. After consideration of risks, benefits and other options for treatment, the patient has consented to  Procedure(s): Catheter ablation as a surgical intervention .  Risks include but not limited to complete heart block, stroke, esophageal damage, nerve damage, bleeding, vascular damage, tamponade, perforation, MI, and death. The patient's history has been reviewed, patient examined, no change in status, stable for surgery.  I have reviewed the patient's chart and labs.  Questions were answered to the patient's satisfaction.    Arjay Jaskiewicz Curt Bears, MD 08/10/2021 7:07 AM

## 2021-08-10 NOTE — Anesthesia Postprocedure Evaluation (Signed)
Anesthesia Post Note  Patient: Theresa Hale  Procedure(s) Performed: SVT ABLATION     Patient location during evaluation: PACU Anesthesia Type: MAC Level of consciousness: awake and alert Pain management: pain level controlled Vital Signs Assessment: post-procedure vital signs reviewed and stable Respiratory status: spontaneous breathing, nonlabored ventilation and respiratory function stable Cardiovascular status: blood pressure returned to baseline and stable Postop Assessment: no apparent nausea or vomiting Anesthetic complications: no   There were no known notable events for this encounter.  Last Vitals:  Vitals:   08/10/21 1058 08/10/21 1125  BP: (!) 151/72 128/70  Pulse: 62 66  Resp: 14 19  Temp: 36.8 C   SpO2: 100% 100%    Last Pain:  Vitals:   08/10/21 1110  TempSrc:   PainSc: 0-No pain                 Lynda Rainwater

## 2021-08-10 NOTE — Discharge Instructions (Signed)

## 2021-08-11 MED FILL — Dobutamine in Dextrose 5% Inj 4 MG/ML: INTRAVENOUS | Qty: 250 | Status: AC

## 2021-08-11 MED FILL — Heparin Sod (Porcine)-NaCl IV Soln 1000 Unit/500ML-0.9%: INTRAVENOUS | Qty: 500 | Status: AC

## 2021-08-24 ENCOUNTER — Telehealth: Payer: Self-pay | Admitting: Cardiovascular Disease

## 2021-08-24 NOTE — Telephone Encounter (Signed)
Would prefer protonix over nexium given potential interaction w/ plavix and nexium.  Otw f/u w/ PCP for dysphagia.

## 2021-08-24 NOTE — Telephone Encounter (Signed)
1) Reviewed the patient's chart. Last review on pepcid was from [name], NP in 03/2021.  Per [name]' OV from 04/05/21: GERD: Patient previously on Nexium and this was switched to Protonix following stenting in September.  Patient has a history of osteoporosis and prefers to come off of the PPI due to potential risk of hypocalcemia.  We will switch to Pepcid 20 mg daily.  I called and spoke with the patient to inquire if she was having problems taking Pepcid. Per the patient, she was on nexium previously. She noticed that due to 2 neck fusions, she has had trouble swallowing, but she felt like her swallowing was better on nexium vs pepcid.      2) The patient also wanted to inquire on how long she will need to continue ASA 81 mg once daily & plavix 75 mg once daily.  - She had a PCI done 10/15/20 with Dr. [name]. Conclusion: Single-vessel coronary artery disease including 80% heavily calcified proximal LAD, and 70% mid LAD stenoses. Shockwave lithotripsy to the proximal LAD using a 2.5 mm shockwave balloon at 4 ATM for 40 pulses. Successful PCI to the proximal and mid LAD with placement of a 2.5 x 38 mm Onyx DES with excellent angiographic result and TIMI-3 flow.   Recommendation: Dual antiplatelet therapy with aspirin and Plavix for 6 months, ideally longer. Ongoing aggressive secondary prevention Will observe overnight and discharge tomorrow.  2 hours bedrest. Mynx closure device used in the right femoral artery.   - I advised the patient I will forward to [name], NP to review and advise regarding the above. - She is aware we will call her back with further recommendations once received.   The patient voices understanding and is agreeable.

## 2021-08-24 NOTE — Telephone Encounter (Signed)
Pt c/o medication issue:  1. Name of Medication: famotidine (PEPCID) 20 MG tablet  2. How are you currently taking this medication (dosage and times per day)? Take 1 tablet (20 mg total) by mouth at bedtime.  3. Are you having a reaction (difficulty breathing--STAT)? no  4. What is your medication issue? Patient calling to see if she can switch the medication out to Nexium. Please advise

## 2021-08-25 NOTE — Telephone Encounter (Signed)
Spoke with the patient and advised on recommendations from [name], NP. Patient states that she would prefer to be on Nexium because it has helped her in the past.  She is wondering since it has been 6 months since PCI if she still needs to stay on the plavix.

## 2021-08-25 NOTE — Telephone Encounter (Signed)
She had a complex proximal LAD intervention. I would prefer that she complete 12 months of dual antiplatelet therapy.  I will defer to Dr. Rockey Situ otherwise.

## 2021-08-25 NOTE — Telephone Encounter (Signed)
Spoke with patient and reviewed provider recommendations. She verbalized understanding with no further questions at this time.  

## 2021-09-12 ENCOUNTER — Ambulatory Visit: Payer: Medicare PPO | Admitting: Cardiology

## 2021-09-12 ENCOUNTER — Ambulatory Visit
Admission: RE | Admit: 2021-09-12 | Discharge: 2021-09-12 | Disposition: A | Discharge status: Home / Self Care | Payer: Medicare PPO | Source: Ambulatory Visit | Attending: Hematology and Oncology | Admitting: Hematology and Oncology

## 2021-09-12 ENCOUNTER — Encounter: Payer: Self-pay | Admitting: Cardiology

## 2021-09-12 VITALS — BP 114/78 | HR 63 | Ht 65.0 in | Wt 116.7 lb

## 2021-09-12 DIAGNOSIS — Z1231 Encounter for screening mammogram for malignant neoplasm of breast: Secondary | ICD-10-CM | POA: Diagnosis not present

## 2021-09-12 DIAGNOSIS — I471 Supraventricular tachycardia: Secondary | ICD-10-CM

## 2021-09-12 NOTE — Patient Instructions (Signed)
Medication Instructions:  Your physician recommends that you continue on your current medications as directed. Please refer to the Current Medication list given to you today.  *If you need a refill on your cardiac medications before your next appointment, please call your pharmacy*   Lab Work: None ordered   Testing/Procedures: None ordered   Follow-Up: At [facility], you and your health needs are our priority.  As part of our continuing mission to provide you with exceptional heart care, we have created designated Provider Care Teams.  These Care Teams include your primary Cardiologist (physician) and Advanced Practice Providers (APPs -  Physician Assistants and Nurse Practitioners) who all work together to provide you with the care you need, when you need it.   Your next appointment:   6 month(s)  The format for your next appointment:   In Person  Provider:   Tommye Standard, PA   Thank you for choosing [facility]!!   Trinidad Curet, RN [phone]  Other Instructions    Important Information About Sugar

## 2021-09-12 NOTE — Progress Notes (Signed)
Electrophysiology Office Note   Date:  09/12/2021   ID:  Theresa Hale, DOB [birth year], MRN [id number]  PCP:  Theresa Bis, MD  Cardiologist:  Theresa Hale Primary Electrophysiologist:  Theresa Hale Theresa Leeds, MD    Chief Complaint: Atrial flutter   History of Present Illness: Theresa Hale is a 66 y.o. female who is being seen today for the evaluation of atrial flutter at the request of Theresa Bis, MD. Presenting today for electrophysiology evaluation.  She has a history today of her atrial flutter and SVT.  She also has a history of breast cancer, hypertension, elevated coronary calcium score.  She developed atrial flutter 12/04/2019.  She is post ablation 12/26/2019.  She also has a history of SVT.  She is now status post ablation 08/10/2021 for AVNRT.  Today, denies symptoms of palpitations, chest pain, shortness of breath, orthopnea, PND, lower extremity edema, claudication, dizziness, presyncope, syncope, bleeding, or neurologic sequela. The patient is tolerating medications without difficulties.  She is currently feeling well.  She has no chest pain or shortness of breath.  Her palpitations are significantly reduced after ablation.  She is overall quite happy with her control.   Past Medical History:  Diagnosis Date   Atrial flutter ([facility])    a. Dx in 2018. Initially only a single episode. (CHA2DS2VASc = 2); b. 11/2020 Recurrent Aflutter s/p DCCV. Eliquis started; c. 12/2020 s/p RFCA; d. 01/2021 Eliquis d/c'd.   Breast cancer ([facility])    CAD (coronary artery disease)    a. 07/2019 Cardiac CT: Ca2+ 262 (90th %'ile); b. 09/2020 Cor CTA: Sev LAD dzs w/ abnl FFR; c. 10/2020 PCI: LM nl, LAD 80p/49m(shockwave lithotripsy + 2.5x38 Onyx DES), LCX mild diff dzs, RCA mild diff dzs.   GERD (gastroesophageal reflux disease)    History of echocardiogram    a. 06/2016 Echo: EF 60-65%, no rwma. Mild TR.   Personal history of radiation therapy 02/2018   PONV (postoperative nausea and vomiting)     PSVT (paroxysmal supraventricular tachycardia) ([facility])    a. 06/2019 Zio: RSR, avg HR 71, max HR 222. 97 runs of SVT, fastest 222 (4 beats), longest 1:05 (164); b. 07/2020 Zio: Predominantly sinus rhythm @ 67 (49-197). 42 SVT runs, fastest 197 x 6 beats, longest 11.3 secs @ 127 bpm.  Triggered events associated with sinus tachycardia.   Past Surgical History:  Procedure Laterality Date   A-FLUTTER ABLATION N/A 12/26/2019   Procedure: A-FLUTTER ABLATION;  Surgeon: CConstance Haw MD;  [facility];  Service: Cardiovascular;  Laterality: N/A;   ABDOMINAL HYSTERECTOMY     ABLATION     BREAST BIOPSY Right 10/02/2017   BREAST EXCISIONAL BIOPSY Right 01/2002   BREAST LUMPECTOMY Right 2019   BREAST LUMPECTOMY WITH RADIOACTIVE SEED AND SENTINEL LYMPH NODE BIOPSY Right 11/26/2017   Procedure: RIGHT BREAST LUMPECTOMY WITH SENTINEL NODE MAPPING AND TARGETED NODE DISECTION ERAS PATHWAY;  Surgeon: Theresa Kussmaul MD;  [facility]  Service: General;  Laterality: Right;   CESAREAN SECTION     CHOLECYSTECTOMY     CORONARY STENT INTERVENTION N/A 10/15/2020   Procedure: CORONARY STENT INTERVENTION;  Surgeon: Theresa Hampshire MD;  [facility];  Service: Cardiovascular;  Laterality: N/A;   KNEE ARTHROSCOPY     LEFT HEART CATH AND CORONARY ANGIOGRAPHY N/A 10/08/2020   Procedure: LEFT HEART CATH AND CORONARY ANGIOGRAPHY;  Surgeon: Theresa Hampshire MD;  Location: [facility];  Service:  Cardiovascular;  Laterality: N/A;   NECK SURGERY     SVT ABLATION N/A 08/10/2021   Procedure: SVT ABLATION;  Surgeon: Theresa Haw, MD;  Location: [facility];  Service: Cardiovascular;  Laterality: N/A;     Current Outpatient Medications  Medication Sig Dispense Refill   Ascorbic Acid (VITAMIN C) 1000 MG tablet Take 1 tablet (1,000 mg total) by mouth daily. (Patient taking differently: Take 500 mg by mouth daily.)     aspirin EC 81 MG tablet  Take 1 tablet (81 mg total) by mouth daily. Swallow whole. 90 tablet 3   Calcium 600-400 MG-UNIT CHEW Chew 1 tablet by mouth in the morning.     Cholecalciferol (VITAMIN D) 125 MCG (5000 UT) CAPS Take 1 capsule by mouth daily. (Patient taking differently: Take 2,000 capsules by mouth daily.) 30 capsule    clopidogrel (PLAVIX) 75 MG tablet Take 1 tablet (75 mg total) by mouth daily. 90 tablet 2   Elderberry 500 MG CAPS Take 1 capsule by mouth daily. (Patient taking differently: Take 500 mg by mouth daily. gummy)     famotidine (PEPCID) 20 MG tablet Take 1 tablet (20 mg total) by mouth at bedtime. 90 tablet 3   rosuvastatin (CRESTOR) 20 MG tablet Take 1 tablet (20 mg total) by mouth in the morning. 90 tablet 2   diltiazem (CARDIZEM) 30 MG tablet Take one tablet as needed for fast heart rate (Patient not taking: Reported on 09/12/2021) 30 tablet 0   No current facility-administered medications for this visit.    Allergies:   Penicillins, Erythromycin, Sulfa antibiotics, and Sulfasalazine   Social History:  The patient  reports that she has never smoked. She has never used smokeless tobacco. She reports that she does not drink alcohol and does not use drugs.   Family History:  The patient's family history includes Alzheimer's disease in her father; Breast cancer (age of onset: 63) in her sister; Breast cancer (age of onset: 73) in her mother; Parkinson's disease in her father; Stroke in her mother.   ROS:  Please see the history of present illness.   Otherwise, review of systems is positive for none.   All other systems are reviewed and negative.   PHYSICAL EXAM: VS:  BP 114/78   Pulse 63   Ht '5\' 5"'$  (1.651 m)   Wt 116 lb 10.4 oz (52.9 kg)   SpO2 98%   BMI 19.41 kg/m  , BMI Body mass index is 19.41 kg/m. GEN: Well nourished, well developed, in no acute distress  HEENT: normal  Neck: no JVD, carotid bruits, or masses Cardiac: RRR; no murmurs, rubs, or gallops,no edema  Respiratory:  clear  to auscultation bilaterally, normal work of breathing GI: soft, nontender, nondistended, + BS MS: no deformity or atrophy  Skin: warm and dry Neuro:  Strength and sensation are intact Psych: euthymic mood, full affect  EKG:  EKG is ordered today. Personal review of the ekg ordered  shows sinus rhythm, rate 63   Recent Labs: 05/04/2021: ALT 21; TSH 3.986 07/25/2021: BUN 16; Creatinine, Ser 0.69; Hemoglobin 14.8; Platelets 174; Potassium 3.8; Sodium 138    Lipid Panel     Component Value Date/Time   CHOL 137 05/04/2021 0839   TRIG 60 05/04/2021 0839   HDL 80 05/04/2021 0839   CHOLHDL 1.7 05/04/2021 0839   VLDL 12 05/04/2021 0839   LDLCALC 45 05/04/2021 0839     Wt Readings from Last 3 Encounters:  09/12/21 116 lb 10.4  oz (52.9 kg)  08/10/21 113 lb (51.3 kg)  07/21/21 114 lb (51.7 kg)      Other studies Reviewed: Additional studies/ records that were reviewed today include: TTE 06/21/16  Review of the above records today demonstrates:  - Left ventricle: The cavity size was normal. Wall thickness was    normal. Systolic function was normal. The estimated ejection    fraction was in the range of 60% to 65%. Wall motion was normal;    there were no regional wall motion abnormalities. There was an    increased relative contribution of atrial contraction to    ventricular filling. Left ventricular diastolic function    parameters were normal.  - Aortic valve: Mildly calcified annulus. Probably trileaflet.  - Mitral valve: Mildly calcified annulus.  - Tricuspid valve: There was mild regurgitation.   Cardiac monitor 08/13/2019 personally reviewed Normal sinus rhythm avg HR of 71 bpm. max HR of 222 bpm   97 Supraventricular Tachycardia runs occurred, the run with the fastest interval lasting 4 beats with a max rate of 222 bpm, the longest lasting 1 min 5 secs with an avg rate of 164 bpm.  Supraventricular Tachycardia was detected within +/- 45 seconds of symptomatic patient  event(s).    Junctional Rhythm was present.    Isolated SVEs were rare (<1.0%), SVE Couplets were rare (<1.0%), and SVE Triplets were rare (<1.0%). Isolated VEs were rare (<1.0%), VE Couplets were rare (<1.0%), and no VE Triplets were present.  ASSESSMENT AND PLAN:  1.  Typical atrial flutter: Post ablation 12/26/2019.  No obvious recurrence.  2.  SVT: Status post ablation for AVNRT on 08/10/2021.  She is feeling well without further episodes of palpitations.  We Emillie Chasen continue with current management.   Current medicines are reviewed at length with the patient today.   The patient does not have concerns regarding her medicines.  The following changes were made today: None  Labs/ tests ordered today include:  Orders Placed This Encounter  Procedures   EKG 12-Lead      Disposition:   FU 6 months  Signed, Shanik Brookshire Theresa Leeds, MD  09/12/2021 10:54 AM     [place] [address] [phone] (office) [phone] (fax)

## 2021-09-25 ENCOUNTER — Other Ambulatory Visit: Payer: Self-pay | Admitting: *Deleted

## 2021-09-25 NOTE — Patient Outreach (Signed)
  Care Coordination   09/25/2021  Name: Theresa [facility] MRN: [id number] DOB: July 29, 1955   Care Coordination Outreach Attempts:  An unsuccessful telephone outreach was attempted today to offer the patient information about available care coordination services as a benefit of their health plan. HIPAA compliant messages left on voicemail, providing contact information for CSW, encouraging patient to return CSW's call at her earliest convenience.  Follow Up Plan:  Additional outreach attempts will be made to offer the patient care coordination information and services.   Encounter Outcome:  No Answer.   Care Coordination Interventions Activated:  No.    Care Coordination Interventions:  No, not indicated.    Nat Christen, BSW, MSW, LCSW  Licensed Education officer, [facility]  Mailing [place] [address], [place], [place] [address] Physical Address-[address] [place], [place] [address] Toll Free Main # [phone] Fax # [phone] Cell # [phone] [email or web address]

## 2021-09-26 ENCOUNTER — Encounter: Payer: Self-pay | Admitting: *Deleted

## 2021-09-26 ENCOUNTER — Other Ambulatory Visit: Payer: Self-pay | Admitting: *Deleted

## 2021-09-26 ENCOUNTER — Ambulatory Visit: Payer: Self-pay | Admitting: *Deleted

## 2021-09-26 NOTE — Patient Outreach (Signed)
  Care Coordination   09/26/2021  Name: SHELMA EIBEN MRN: [id number] DOB: [birth year]   Care Coordination Outreach Attempts:  A second unsuccessful outreach was attempted today to offer the patient with information about available care coordination services as a benefit of their health plan.   HIPAA compliant message left on voicemail, providing contact information for CSW, encouraging patient to return CSW's call at her earliest convenience.  Follow Up Plan:  Additional outreach attempts will be made to offer the patient care coordination information and services.   Encounter Outcome:  No Answer.   Care Coordination Interventions Activated:  No.    Care Coordination Interventions:  No, not indicated.    Nat Christen, BSW, MSW, LCSW  Licensed Education officer, environmental Health [facility]  Mailing [place] [address], [place], [place] [address] Physical Address-[address] [place], [place] [address] Toll Free Main # [phone] Fax # [phone] Cell # [phone] [email or web address]

## 2021-09-26 NOTE — Patient Instructions (Signed)
Visit Information  Thank you for taking time to visit with me today. Please don't hesitate to contact me if I can be of assistance to you.   Please call the care guide team at [phone] if you need to cancel or reschedule your appointment.   If you are experiencing a Mental Health or Behavioral Health Crisis or need someone to talk to, please call the Suicide and Crisis Lifeline: 988 call the [place] National Suicide Prevention Lifeline: 1-800-273-8255 or TTY: 1-800-799-4 TTY (1-800-799-4889) to talk to a trained counselor call 1-800-273-TALK (toll free, 24 hour hotline) go to [facility] [address], [place] ([phone]) call the [place] Crisis Line: 800-939-9988 call 911  Patient verbalizes understanding of instructions and care plan provided today and agrees to view in MyChart. Active MyChart status and patient understanding of how to access instructions and care plan via MyChart confirmed with patient.     No further follow up required.  Evalette Montrose, BSW, MSW, LCSW  Licensed Clinical Social Worker  [facility] [place] [facility]  Mailing Address-[address], [place], [place] [address] Physical Address-[address], , [place] [address] Toll Free Main # [phone] Fax # [phone] Cell # [phone] [email or web address]            

## 2021-09-26 NOTE — Patient Outreach (Signed)
  Care Coordination   Initial Visit Note   09/26/2021  Name: KRISTENE LIBERATI MRN: [id number] DOB: 1955/05/12  BRECKLYNN JIAN is a 66 y.o. year old female who sees Caryl Bis, MD for primary care. I spoke with [name] by phone today.  What matters to the patients health and wellness today?  No Intervention Indicated.   Goals Addressed   None     SDOH assessments and interventions completed:  Yes.    SDOH Interventions Today    Flowsheet Row Most Recent Value  SDOH Interventions   Food Insecurity Interventions Intervention Not Indicated  Financial Strain Interventions Intervention Not Indicated  Housing Interventions Intervention Not Indicated  Stress Interventions Intervention Not Indicated  Social Connections Interventions Intervention Not Indicated  Transportation Interventions Intervention Not Indicated        Care Coordination Interventions Activated:  Yes.   Care Coordination Interventions:  Yes, provided.   Follow up plan: No further intervention required.   Encounter Outcome:  Pt. Visit Completed.   Nat Christen, BSW, MSW, LCSW  Licensed Education officer, environmental Health System  Mailing [place] [address], [place],  [address] Physical Address-[address] [place],  [address] Toll Free Main # [phone] Fax # [phone] Cell # [phone] [email or web address]

## 2021-10-14 DIAGNOSIS — D649 Anemia, unspecified: Secondary | ICD-10-CM | POA: Diagnosis not present

## 2021-10-14 DIAGNOSIS — Z681 Body mass index (BMI) 19 or less, adult: Secondary | ICD-10-CM | POA: Diagnosis not present

## 2021-10-14 DIAGNOSIS — R5383 Other fatigue: Secondary | ICD-10-CM | POA: Diagnosis not present

## 2021-10-14 DIAGNOSIS — R202 Paresthesia of skin: Secondary | ICD-10-CM | POA: Diagnosis not present

## 2021-10-14 DIAGNOSIS — E538 Deficiency of other specified B group vitamins: Secondary | ICD-10-CM | POA: Diagnosis not present

## 2021-10-21 ENCOUNTER — Ambulatory Visit: Payer: Medicare PPO | Admitting: Student

## 2021-10-31 ENCOUNTER — Telehealth: Payer: Self-pay | Admitting: Cardiology

## 2021-10-31 NOTE — Telephone Encounter (Signed)
Pt c/o medication issue:  1. Name of Medication:  clopidogrel (PLAVIX) 75 MG tablet  2. How are you currently taking this medication (dosage and times per day)?    3. Are you having a reaction (difficulty breathing--STAT)? np  4. What is your medication issue? Patient states that she was told to only take the blood thinner for 9-12. Is after the 12 months calling to see if she is suppose to still take it. Please advise

## 2021-11-02 NOTE — Telephone Encounter (Signed)
Reviewed Dr. Donivan Scull recommendations and she is due for follow up. She verbalized understanding to continue Plavix and then it can be discussed at her appointment. Scheduled her to come in to see Dr. Rockey Situ and she confirmed date and time. No further questions at this time.

## 2021-11-02 NOTE — Telephone Encounter (Signed)
Patient is following up, again requesting advisement regarding blood thinner.

## 2021-11-07 ENCOUNTER — Telehealth: Payer: Self-pay

## 2021-11-07 NOTE — Telephone Encounter (Signed)
Spoke with the patient and advised her on recommendations from Dr. Rockey Situ about continuing Plavix for another year. Patient verbalized understanding.

## 2021-11-07 NOTE — Telephone Encounter (Signed)
-----   Message from Minna Merritts, MD sent at 11/05/2021 11:58 AM EDT ----- Concerning whether to stop Plavix discussed with interventional cardiology They recommend she continue Plavix for at least another year given long stent, small in diameter, precarious location in the LAD Thx TG  ----- Message ----- From: Wellington Hampshire, MD Sent: 11/02/2021   3:10 PM EDT To: Minna Merritts, MD  If she is not having any side effects with it, I favor that we continue Plavix for at least another year given the issues that you mentioned.  It can be stopped 5 days before procedures if needed.  Thanks.  MA  ----- Message ----- From: Minna Merritts, MD Sent: 11/01/2021   8:27 AM EDT To: Wellington Hampshire, MD  What are your thoughts on continuing or stopping Plavix Had long stent proximal and mid LAD Has done her year Stent small in diameter 2.5 x 38 mm Thx TG

## 2021-11-07 NOTE — Progress Notes (Deleted)
Cardiology Office Note  Date:  11/07/2021   ID:  Theresa Hale, DOB Sep 11, 1955, MRN [id number]  PCP:  Theresa Bis, MD   No chief complaint on file.   HPI:  [name] is a 66 year old woman with past medical history of Anxiety attacks Breast cancer Atrial flutter in 05/2016, 10/21, Post ablation 12/26/2019. 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    Recently seen September 07, 2020 in the office Reported having some episodes of chest fullness/pressure concerning for angina  Cardiac CTA September 16, 2020 Documenting severe proximal to mid LAD stenosis, FFR of 0.7  Cardiac catheterization October 15, 2020 Mid LAD lesion is 70% stenosed.   Prox LAD lesion is 80% stenosed.   A drug-eluting stent in the proximal and mid LAD was successfully placed using a STENT ONYX FRONTIER 2.5X38.  Currently on aspirin Plavix  Numbers reported from home, pressures 735 to 329 systolic   Declined b-blocker: labile pressure/bradycardia Prefers not to add medications at this time  Feels weak, fatigued, legs feel tired Will be open to cardiac rehab  EKG personally reviewed by myself on todays visit Shows normal sinus rhythm rate 74 bpm no significant ST-T wave changes  Other past medical history reviewed Followed by EP in [facility] followed for atrial flutter and SVT Underwent ablation December 26, 2019 episodes of Near syncope associated with low blood pressure, metoprolol held   ZIO monitor ordered by one of our providers Patient had a min HR of 49 bpm, max HR of 197 bpm, and avg HR of 67 bpm. Predominant underlying rhythm was  Sinus Rhythm.   42 Supraventricular Tachycardia runs occurred, the run with the fastest interval lasting 6 beats with a max rate of 197 bpm, the longest lasting 11.3 secs with an avg rate of 127 bpm. Not patient triggered    Isolated SVEs were rare (<1.0%), SVE Couplets were rare (<1.0%), and SVE Triplets were rare (<1.0%). Isolated VEs were rare (<1.0%, 123), VE Couplets were rare (<1.0%,  5), and VE Triplets were rare (<1.0%, 1).   Patient triggered events associated with sinus tachycardia  Developed atrial flutter with RVR, 1:45 Am, seen in the clinic December 04, 2019 Was sent to the emergency room, underwent cardioversion 6:45 pm  Started on anticoagulation She has follow-up with EP December 16, 2019  She is only on Eliquis 2.5 twice daily (started by the emergency room) Given normal renal function, age less than 34, should be on 5 twice daily  Echo 2018  Left ventricle: The cavity size was normal. Wall thickness was    normal. Systolic function was normal. The estimated ejection    fraction was in the range of 60% to 65%. Wall motion was normal;    there were no regional wall motion abnormalities. There was an    increased relative contribution of atrial contraction to    ventricular filling. Left ventricular diastolic function    parameters were normal.  - Aortic valve: Mildly calcified annulus. Probably trileaflet.  - Mitral valve: Mildly calcified annulus.  - Tricuspid valve: There was mild regurgitation.  03/29/2019 Near syncope Went down on the ground, etiology unclear  PMH:   has a past medical history of Atrial flutter ([place]), Breast cancer ([place]), CAD (coronary artery disease), GERD (gastroesophageal reflux disease), History of echocardiogram, Personal history of radiation therapy (02/2018), PONV (postoperative nausea and vomiting), and PSVT (paroxysmal supraventricular tachycardia) ([place]).  PSH:    Past Surgical History:  Procedure Laterality Date   A-FLUTTER  ABLATION N/A 12/26/2019   Procedure: A-FLUTTER ABLATION;  Surgeon: Theresa Haw, MD;  Location: [place] [facility];  Service: Cardiovascular;  Laterality: N/A;   ABDOMINAL HYSTERECTOMY     ABLATION     BREAST BIOPSY Right 10/02/2017   BREAST EXCISIONAL BIOPSY Right 01/2002   BREAST LUMPECTOMY Right 2019   BREAST LUMPECTOMY WITH RADIOACTIVE SEED AND SENTINEL LYMPH NODE BIOPSY Right 11/26/2017   Procedure: RIGHT BREAST LUMPECTOMY WITH SENTINEL NODE MAPPING AND TARGETED NODE DISECTION ERAS PATHWAY;  Surgeon: Theresa Kussmaul, MD;  Location: [place];  Service: General;  Laterality: Right;   CESAREAN SECTION     CHOLECYSTECTOMY     CORONARY STENT INTERVENTION N/A 10/15/2020   Procedure: CORONARY STENT INTERVENTION;  Surgeon: Theresa Hampshire, MD;  Location: [place] [facility];  Service: Cardiovascular;  Laterality: N/A;   KNEE ARTHROSCOPY     LEFT HEART CATH AND CORONARY ANGIOGRAPHY N/A 10/08/2020   Procedure: LEFT HEART CATH AND CORONARY ANGIOGRAPHY;  Surgeon: Theresa Hampshire, MD;  Location: [place] [facility];  Service: Cardiovascular;  Laterality: N/A;   NECK SURGERY     SVT ABLATION N/A 08/10/2021   Procedure: SVT ABLATION;  Surgeon: Theresa Haw, MD;  Location: [place] [facility];  Service: Cardiovascular;  Laterality: N/A;    Current Outpatient Medications  Medication Sig Dispense Refill   Ascorbic Acid (VITAMIN C) 1000 MG tablet Take 1 tablet (1,000 mg total) by mouth daily. (Patient taking differently: Take 500 mg by mouth daily.)     aspirin EC 81 MG tablet Take 1 tablet (81 mg total) by mouth daily. Swallow whole. 90 tablet 3   Calcium 600-400 MG-UNIT CHEW Chew 1 tablet by mouth in the morning.     Cholecalciferol (VITAMIN D) 125 MCG (5000 UT) CAPS Take 1 capsule by mouth daily. (Patient taking differently: Take 2,000 capsules by mouth daily.) 30 capsule    clopidogrel (PLAVIX) 75 MG tablet Take 1 tablet (75 mg total) by mouth daily. 90 tablet 2    diltiazem (CARDIZEM) 30 MG tablet Take one tablet as needed for fast heart rate (Patient not taking: Reported on 09/12/2021) 30 tablet 0   Elderberry 500 MG CAPS Take 1 capsule by mouth daily. (Patient taking differently: Take 500 mg by mouth daily. gummy)     famotidine (PEPCID) 20 MG tablet Take 1 tablet (20 mg total) by mouth at bedtime. 90 tablet 3   rosuvastatin (CRESTOR) 20 MG tablet Take 1 tablet (20 mg total) by mouth in the morning. 90 tablet 2   No current facility-administered medications for this visit.    Allergies:   Penicillins, Erythromycin, Sulfa antibiotics, and Sulfasalazine   Social History:  The patient  reports that she has never smoked. She has never been exposed to tobacco smoke. She has never used smokeless tobacco. She reports that she does not drink alcohol and does not use drugs.   Family History:   family history includes Alzheimer's disease in her father; Breast cancer (age of onset: 41) in her sister; Breast cancer (age of onset: 39) in her mother; Parkinson's disease in her father; Stroke in her  mother.    Review of Systems: Review of Systems  Constitutional: Negative.   HENT: Negative.    Respiratory:  Positive for shortness of breath.   Cardiovascular:  Positive for chest pain.  Gastrointestinal: Negative.   Musculoskeletal: Negative.   Psychiatric/Behavioral: Negative.    All other systems reviewed and are negative.   PHYSICAL EXAM: VS:  There were no vitals taken for this visit. , BMI There is no height or weight on file to calculate BMI. Constitutional:  oriented to person, place, and time. No distress.  HENT:  Head: Grossly normal Eyes:  no discharge. No scleral icterus.  Neck: No JVD, no carotid bruits  Cardiovascular: Regular rate and rhythm, no murmurs appreciated Pulmonary/Chest: Clear to auscultation bilaterally, no wheezes or rails Abdominal: Soft.  no distension.  no tenderness.  Musculoskeletal: Normal range of motion Neurological:   normal muscle tone. Coordination normal. No atrophy Skin: Skin warm and dry Psychiatric: normal affect, pleasant  Recent Labs: 05/04/2021: ALT 21; TSH 3.986 07/25/2021: BUN 16; Creatinine, Ser 0.69; Hemoglobin 14.8; Platelets 174; Potassium 3.8; Sodium 138    Lipid Panel Lab Results  Component Value Date   CHOL 137 05/04/2021   HDL 80 05/04/2021   LDLCALC 45 05/04/2021   TRIG 60 05/04/2021      Wt Readings from Last 3 Encounters:  09/12/21 116 lb 10.4 oz (52.9 kg)  08/10/21 113 lb (51.3 kg)  07/21/21 114 lb (51.7 kg)     ASSESSMENT AND PLAN:  Problem List Items Addressed This Visit   None Atrial flutter Underwent ablation with EP Off beta-blocker secondary to hypotension  SVT Noted on monitor Difficulty tolerating beta-blocker secondary to low blood pressure Followed by EP  Coronary artery disease with stable angina Recent stent placement to her LAD, on aspirin Plavix Referral made to cardiac rehab  Dizziness Improved by holding metoprolol  Hyperlipidemia Crestor 20, goal LDL less than 70  Headache Prior history Follow-up with primary care    total encounter time more than 25 minutes  Greater than 50% was spent in counseling and coordination of care with the patient    Signed, Esmond Plants, M.D., Ph.D. [place], [place]

## 2021-11-08 ENCOUNTER — Ambulatory Visit: Payer: Medicare PPO | Admitting: Cardiovascular Disease

## 2021-11-08 ENCOUNTER — Other Ambulatory Visit: Payer: Self-pay | Admitting: Cardiovascular Disease

## 2021-11-09 ENCOUNTER — Other Ambulatory Visit: Payer: Self-pay | Admitting: Cardiovascular Disease

## 2021-11-16 ENCOUNTER — Other Ambulatory Visit: Payer: Self-pay | Admitting: Family Medicine

## 2021-11-16 DIAGNOSIS — M810 Age-related osteoporosis without current pathological fracture: Secondary | ICD-10-CM

## 2021-11-16 DIAGNOSIS — E559 Vitamin D deficiency, unspecified: Secondary | ICD-10-CM | POA: Diagnosis not present

## 2021-11-16 DIAGNOSIS — R0602 Shortness of breath: Secondary | ICD-10-CM | POA: Diagnosis not present

## 2021-11-16 DIAGNOSIS — I4892 Unspecified atrial flutter: Secondary | ICD-10-CM | POA: Diagnosis not present

## 2021-11-16 DIAGNOSIS — Z681 Body mass index (BMI) 19 or less, adult: Secondary | ICD-10-CM | POA: Diagnosis not present

## 2021-11-16 DIAGNOSIS — I25118 Atherosclerotic heart disease of native coronary artery with other forms of angina pectoris: Secondary | ICD-10-CM | POA: Diagnosis not present

## 2021-11-16 DIAGNOSIS — Z23 Encounter for immunization: Secondary | ICD-10-CM | POA: Diagnosis not present

## 2021-11-16 DIAGNOSIS — K760 Fatty (change of) liver, not elsewhere classified: Secondary | ICD-10-CM | POA: Diagnosis not present

## 2021-11-16 DIAGNOSIS — R413 Other amnesia: Secondary | ICD-10-CM | POA: Diagnosis not present

## 2021-12-06 DIAGNOSIS — R202 Paresthesia of skin: Secondary | ICD-10-CM | POA: Diagnosis not present

## 2021-12-06 DIAGNOSIS — R262 Difficulty in walking, not elsewhere classified: Secondary | ICD-10-CM | POA: Diagnosis not present

## 2021-12-06 DIAGNOSIS — R200 Anesthesia of skin: Secondary | ICD-10-CM | POA: Diagnosis not present

## 2021-12-06 DIAGNOSIS — Z853 Personal history of malignant neoplasm of breast: Secondary | ICD-10-CM | POA: Diagnosis not present

## 2021-12-09 ENCOUNTER — Ambulatory Visit
Admission: RE | Admit: 2021-12-09 | Discharge: 2021-12-09 | Disposition: A | Discharge status: Home / Self Care | Payer: Medicare PPO | Source: Ambulatory Visit | Attending: Family Medicine | Admitting: Family Medicine

## 2021-12-09 DIAGNOSIS — M810 Age-related osteoporosis without current pathological fracture: Secondary | ICD-10-CM | POA: Diagnosis not present

## 2022-01-12 IMAGING — CR DG CHEST 2V
1 series · 2 of 2 positions shown · non-contrast
Comparison: Chest x-ray 12/04/2019, CT cardiac 07/23/2019, CT chest
08/18/2005

CLINICAL DATA: Upper chest and neck pain.

EXAM:
CHEST - 2 VIEW

[Series 1: dg chest 2 view · 0.14mm/px · 2 of 2 slices shown]
[im 1/2]
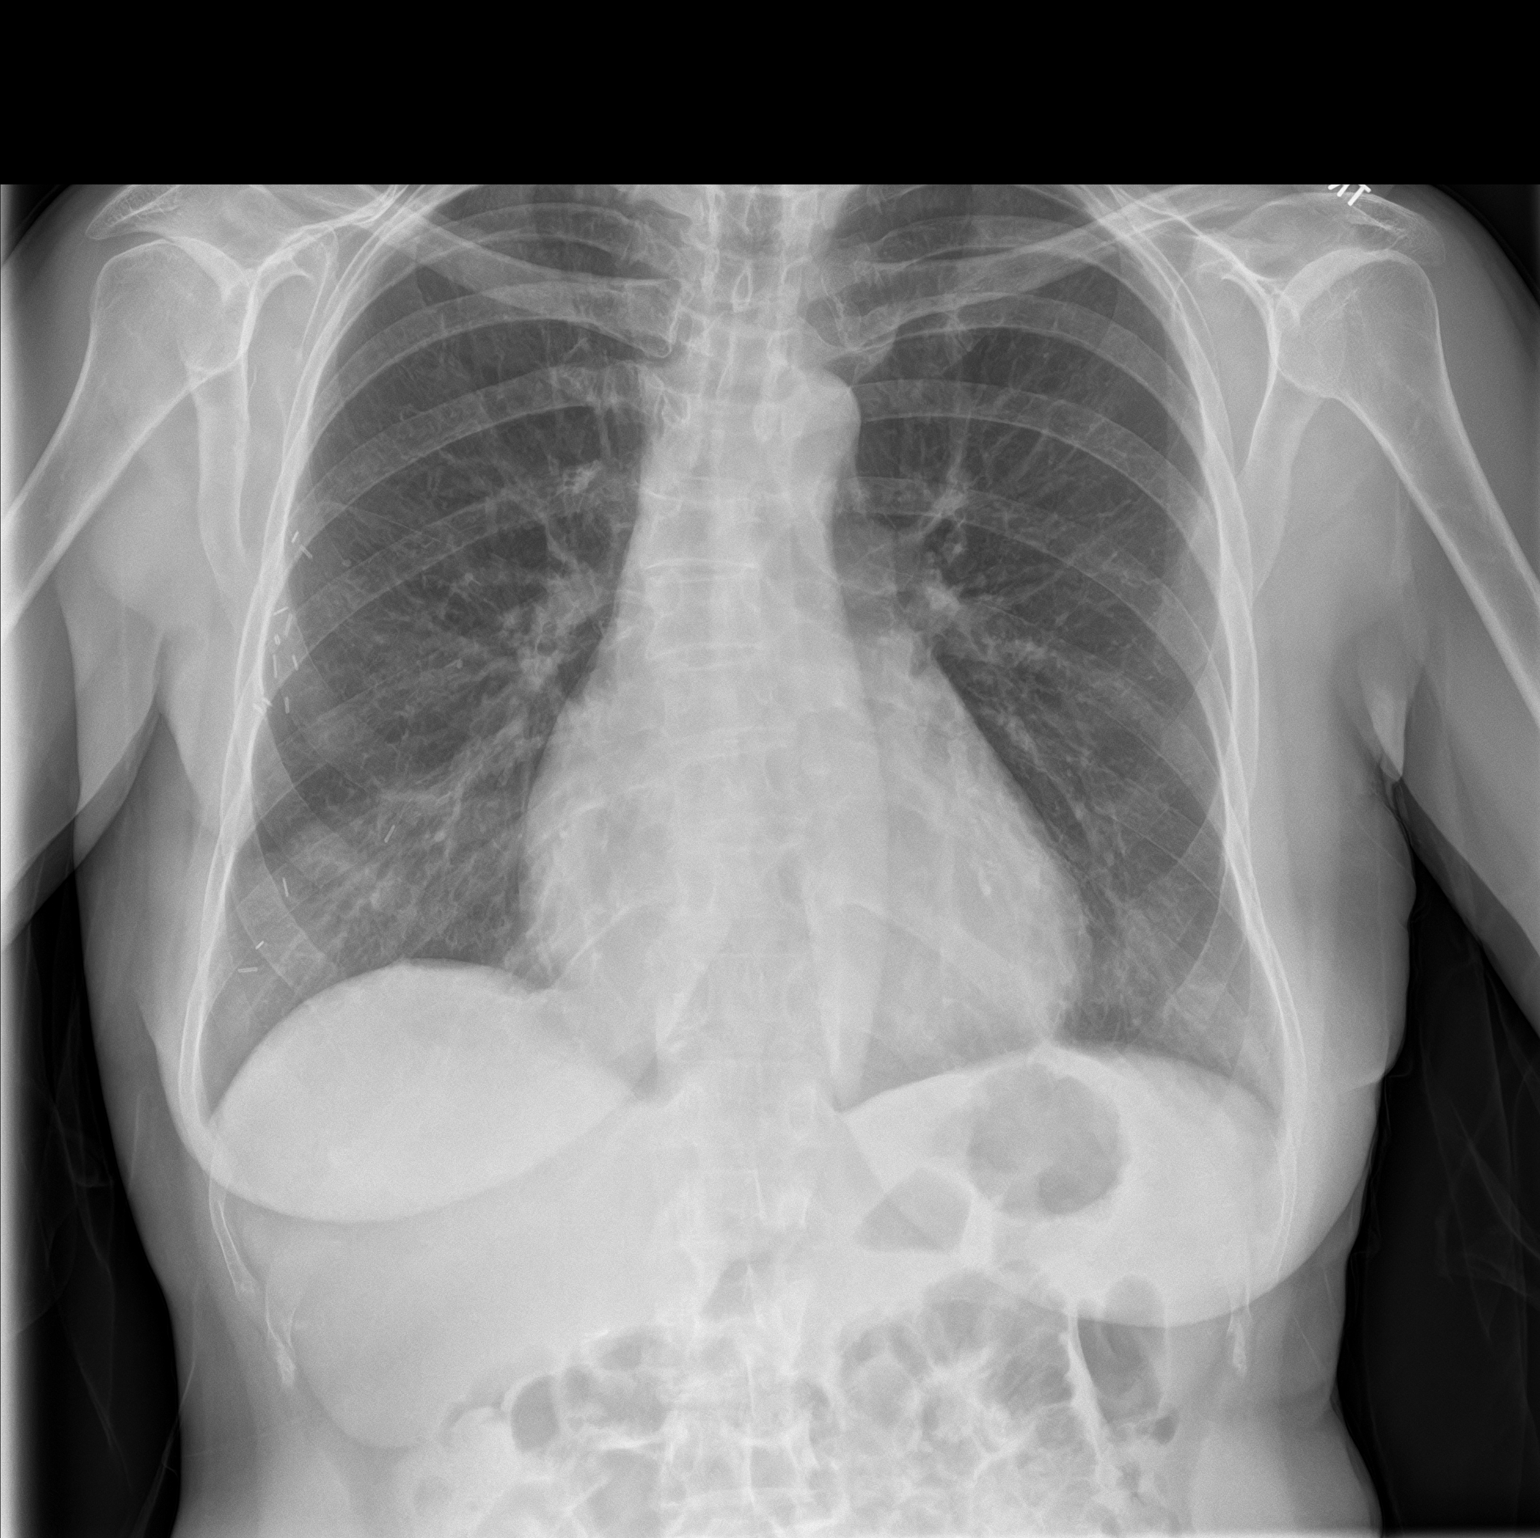
[im 2/2]
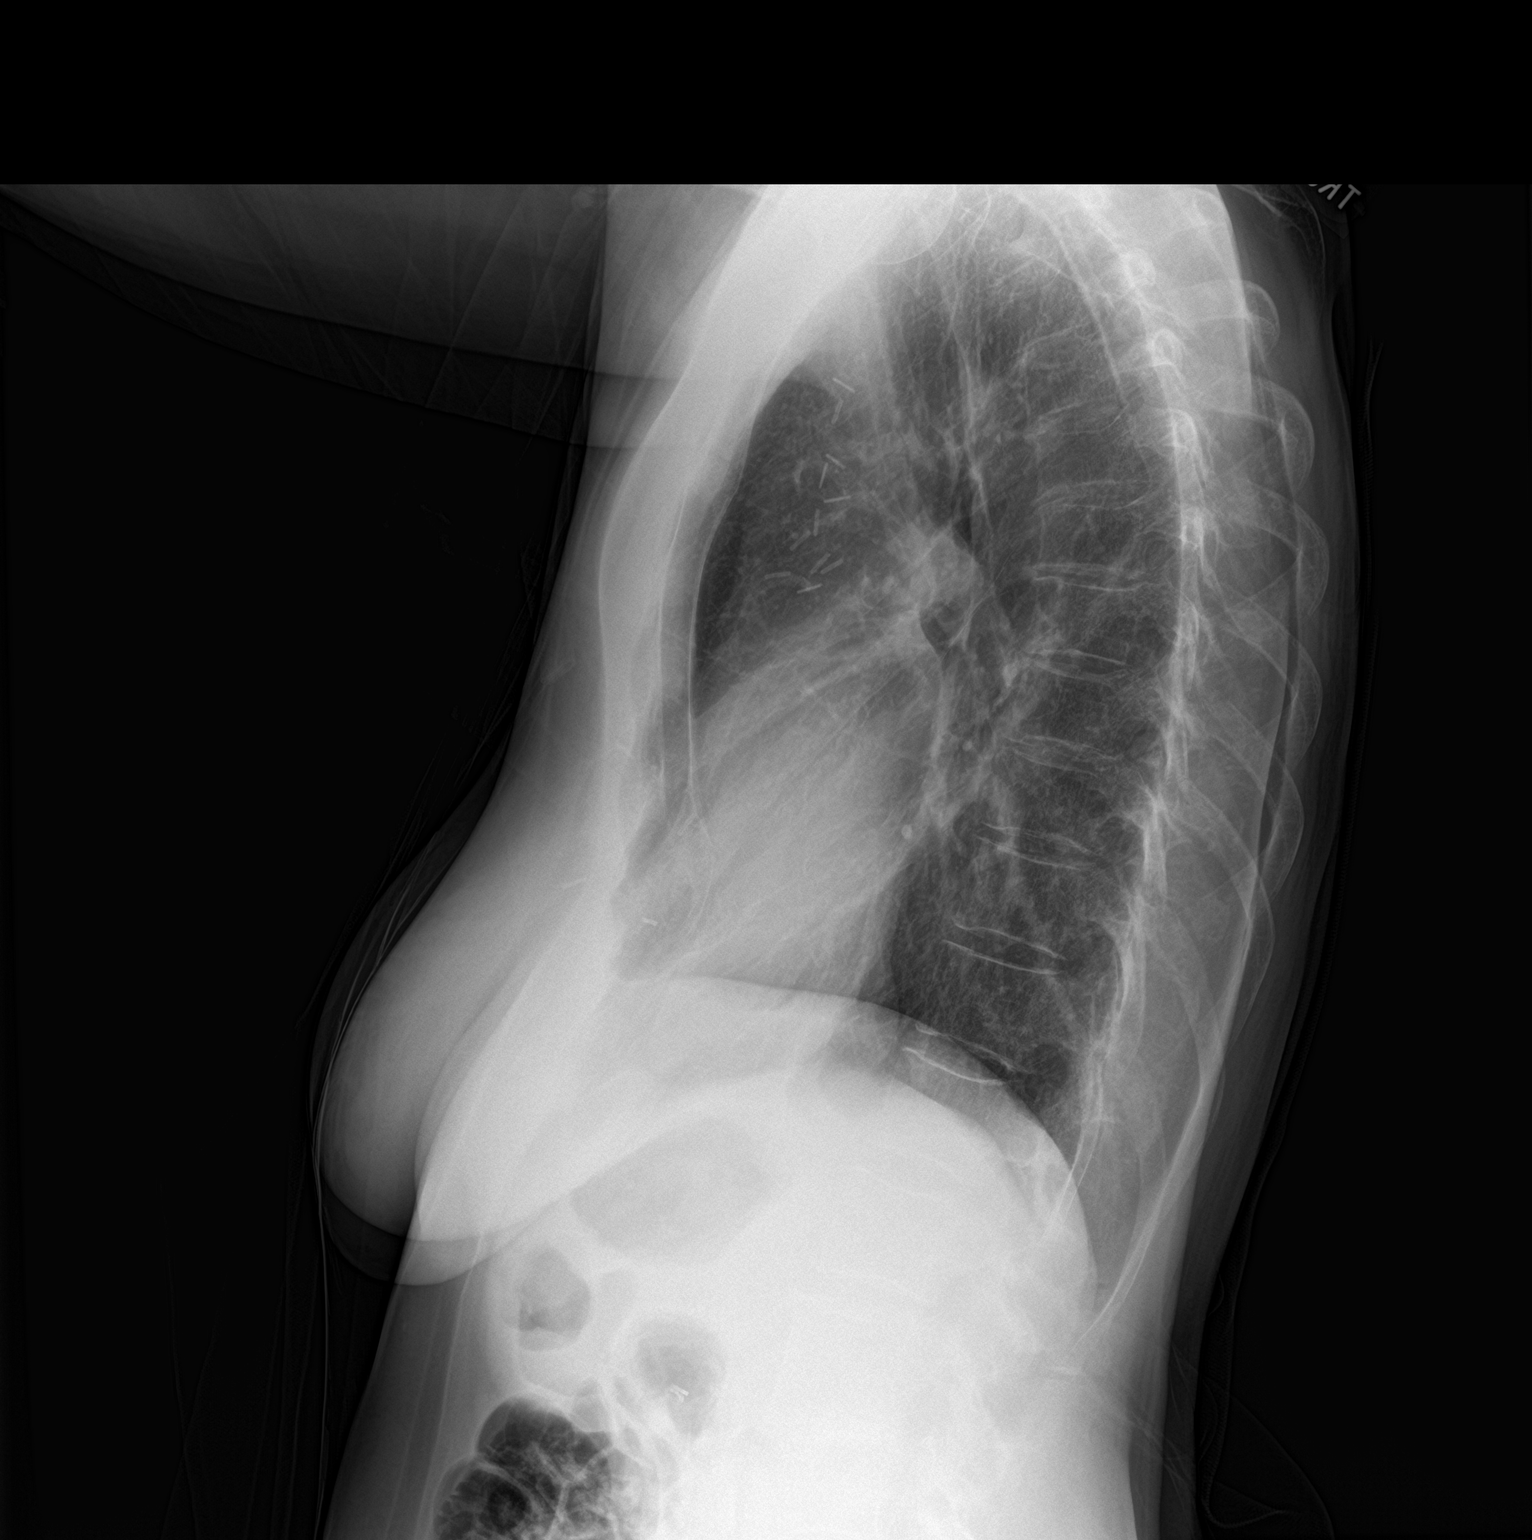

[2 of 2 positions shown; findings below may reference images not displayed]

FINDINGS: The heart size and mediastinal contours are within normal limits.

Biapical pleural/pulmonary scarring. No focal consolidation. No
pulmonary edema. No pleural effusion. No pneumothorax.

Persistent diffusely decreased bone density. No acute osseous
abnormality. Cervical spine surgical hardware. Right axillary
surgical clips. Age-indeterminate superior endplate mild compression
fracture of the upper thoracic spine.
IMPRESSION: 1. No active cardiopulmonary disease.
2. Diffusely decreased bone density with an age-indeterminate
superior endplate mild compression fracture of the upper thoracic
spine.

## 2022-01-15 NOTE — Progress Notes (Unsigned)
Cardiology Office Note  Date:  01/16/2022   ID:  SHAGUANA LOVE, DOB 1955-08-15, MRN [id number]  PCP:  Caryl Bis, MD   Chief Complaint  Patient presents with   6 month follow up     Patient c/o SVT spells at times with chest pressure. Medications reviewed by the patient verbally.     HPI:  Ms. Elenora Hawbaker is a 66 year old woman with past medical history of Anxiety attacks Breast cancer Atrial flutter in 05/2016, 10/21 HTN CT coronary calcium score 262 Long history of paroxysmal tachycardia, symptomatic flutter and SVT Intolerant of rate and rhythm controlling medications secondary to hypotension Who presents for routine follow-up of her tachycardia, hx of atrial flutter, angina, CAD  Last seen in the office by myself September 2022 Seen by EP July 2023 Flutter ablation December 26, 2019, SVT ablation August 10, 2021  Uses cardia mobile to track her heart rate and rhythm Paroxymal tachycardia rate 100 to 120 bpm Rhythm strips reviewed, predominantly sinus tachycardia Sometimes with exertion in the heat will have tachycardia such as showering, has to sit down for a minute and recover Does biking at home  BP at home 108 to 130, AVG 120  Denies chest pain concerning for angina  Cardiac CTA September 16, 2020 Documenting severe proximal to mid LAD stenosis, FFR of 0.7  Cardiac catheterization October 15, 2020 Mid LAD lesion is 70% stenosed.   Prox LAD lesion is 80% stenosed.   A drug-eluting stent in the proximal and mid LAD was successfully placed using a STENT ONYX FRONTIER 2.5X38.  Currently on aspirin Plavix  Numbers reported from home, pressures 542 to 706 systolic   Declined b-blocker in the past: labile pressure/bradycardia Reports having some side effects on metoprolol  EKG personally reviewed by myself on todays visit Shows normal sinus rhythm rate 66 bpm no significant ST-T wave changes  Other past medical history reviewed [facility] EP in  [facility] followed for atrial flutter and SVT Underwent ablation December 26, 2019 episodes of Near syncope associated with low blood pressure, metoprolol held   ZIO monitor ordered by one of our providers Patient had a min HR of 49 bpm, max HR of 197 bpm, and avg HR of 67 bpm. Predominant underlying rhythm was Sinus Rhythm.   42 Supraventricular Tachycardia runs occurred, the run with the fastest interval lasting 6 beats with a max rate of 197 bpm, the longest lasting 11.3 secs with an avg rate of 127 bpm. Not patient triggered    Isolated SVEs were rare (<1.0%), SVE Couplets were rare (<1.0%), and SVE Triplets were rare (<1.0%). Isolated VEs were rare (<1.0%, 123), VE Couplets were rare (<1.0%,  5), and VE Triplets were rare (<1.0%, 1).   Patient triggered events associated with sinus tachycardia  Developed atrial flutter with RVR, 1:45 Am, seen in the clinic December 04, 2019 Was sent to the emergency room, underwent cardioversion 6:45 pm  Started on anticoagulation She has follow-up with EP December 16, 2019  She is only on Eliquis 2.5 twice daily (started by the emergency room) Given normal renal function, age less than 57, should be on 5 twice daily  Echo 2018  Left ventricle: The cavity size was normal. Wall thickness was    normal. Systolic function was normal. The estimated ejection    fraction was in the range of 60% to 65%. Wall motion was normal;    there were no regional wall motion abnormalities. There was an    increased  relative contribution of atrial contraction to    ventricular filling. Left ventricular diastolic function    parameters were normal.  - Aortic valve: Mildly calcified annulus. Probably trileaflet.  - Mitral valve: Mildly calcified annulus.  - Tricuspid valve: There was mild regurgitation.  03/29/2019 Near syncope Went down on the ground, etiology unclear   PMH:   has a past medical history of Atrial flutter ([place]), Breast cancer ([place]), CAD  (coronary artery disease), GERD (gastroesophageal reflux disease), History of echocardiogram, Personal history of radiation therapy (02/2018), PONV (postoperative nausea and vomiting), and PSVT (paroxysmal supraventricular tachycardia).  PSH:    Past Surgical History:  Procedure Laterality Date   A-FLUTTER ABLATION N/A 12/26/2019   Procedure: A-FLUTTER ABLATION;  Surgeon: Constance Haw, MD;  Location: [place] [facility];  Service: Cardiovascular;  Laterality: N/A;   ABDOMINAL HYSTERECTOMY     ABLATION     BREAST BIOPSY Right 10/02/2017   BREAST EXCISIONAL BIOPSY Right 01/2002   BREAST LUMPECTOMY Right 2019   BREAST LUMPECTOMY WITH RADIOACTIVE SEED AND SENTINEL LYMPH NODE BIOPSY Right 11/26/2017   Procedure: RIGHT BREAST LUMPECTOMY WITH SENTINEL NODE MAPPING AND TARGETED NODE DISECTION ERAS PATHWAY;  Surgeon: Jovita Kussmaul, MD;  Location: [place];  Service: General;  Laterality: Right;   CESAREAN SECTION     CHOLECYSTECTOMY     CORONARY STENT INTERVENTION N/A 10/15/2020   Procedure: CORONARY STENT INTERVENTION;  Surgeon: Wellington Hampshire, MD;  Location: [place] [facility];  Service: Cardiovascular;  Laterality: N/A;   KNEE ARTHROSCOPY     LEFT HEART CATH AND CORONARY ANGIOGRAPHY N/A 10/08/2020   Procedure: LEFT HEART CATH AND CORONARY ANGIOGRAPHY;  Surgeon: Wellington Hampshire, MD;  Location: [place] [facility];  Service: Cardiovascular;  Laterality: N/A;   NECK SURGERY     SVT ABLATION N/A 08/10/2021   Procedure: SVT ABLATION;  Surgeon: Constance Haw, MD;  Location: [place] [facility];  Service: Cardiovascular;  Laterality: N/A;    Current Outpatient Medications  Medication Sig Dispense Refill   Ascorbic Acid (VITAMIN C) 1000 MG tablet Take 1 tablet (1,000 mg total) by mouth daily. (Patient taking differently: Take 500 mg by mouth daily.)     aspirin EC 81 MG tablet Take 1 tablet (81 mg total) by mouth daily. Swallow whole. 90 tablet 3   Calcium 600-400  MG-UNIT CHEW Chew 1 tablet by mouth in the morning.     Cholecalciferol (VITAMIN D) 125 MCG (5000 UT) CAPS Take 1 capsule by mouth daily. (Patient taking differently: Take 2,000 capsules by mouth daily.) 30 capsule    clopidogrel (PLAVIX) 75 MG tablet Take 1 tablet by mouth once daily 90 tablet 0   diltiazem (CARDIZEM) 30 MG tablet Take one tablet as needed for fast heart rate 30 tablet 0   Elderberry 500 MG CAPS Take 1 capsule by mouth daily. (Patient taking differently: Take 500 mg by mouth daily. gummy)     famotidine (PEPCID) 20 MG tablet Take 1 tablet (20 mg total) by mouth at bedtime. 90 tablet 3   rosuvastatin (CRESTOR) 20 MG tablet TAKE 1 TABLET BY MOUTH IN THE MORNING 90 tablet 0   No current facility-administered medications for this visit.    Allergies:   Penicillins, Erythromycin, Sulfa antibiotics, Nitrofurantoin, Penicillin g, and Sulfasalazine   Social History:  The patient  reports that she has never smoked. She has never been exposed to tobacco smoke. She has never used smokeless tobacco. She reports that she  does not drink alcohol and does not use drugs.   Family History:   family history includes Alzheimer's disease in her father; Breast cancer (age of onset: 75) in her sister; Breast cancer (age of onset: 60) in her mother; Parkinson's disease in her father; Stroke in her mother.    Review of Systems: Review of Systems  Constitutional: Negative.   HENT: Negative.    Respiratory: Negative.    Cardiovascular:  Positive for palpitations.  Gastrointestinal: Negative.   Musculoskeletal: Negative.   Psychiatric/Behavioral: Negative.    All other systems reviewed and are negative.   PHYSICAL EXAM: VS:  BP (!) 150/84 (BP Location: Left Arm, Patient Position: Sitting, Cuff Size: Normal)   Pulse 66   Ht '5\' 5"'$  (1.651 m)   Wt 114 lb 4 oz (51.8 kg)   SpO2 98%   BMI 19.01 kg/m  , BMI Body mass index is 19.01 kg/m. Constitutional:  oriented to person, place, and time. No  distress.  HENT:  Head: Grossly normal Eyes:  no discharge. No scleral icterus.  Neck: No JVD, no carotid bruits  Cardiovascular: Regular rate and rhythm, no murmurs appreciated Pulmonary/Chest: Clear to auscultation bilaterally, no wheezes or rails Abdominal: Soft.  no distension.  no tenderness.  Musculoskeletal: Normal range of motion Neurological:  normal muscle tone. Coordination normal. No atrophy Skin: Skin warm and dry Psychiatric: normal affect, pleasant   Recent Labs: 05/04/2021: ALT 21; TSH 3.986 07/25/2021: BUN 16; Creatinine, Ser 0.69; Hemoglobin 14.8; Platelets 174; Potassium 3.8; Sodium 138    Lipid Panel Lab Results  Component Value Date   CHOL 137 05/04/2021   HDL 80 05/04/2021   LDLCALC 45 05/04/2021   TRIG 60 05/04/2021      Wt Readings from Last 3 Encounters:  01/16/22 114 lb 4 oz (51.8 kg)  09/12/21 116 lb 10.4 oz (52.9 kg)  08/10/21 113 lb (51.3 kg)     ASSESSMENT AND PLAN:  Problem List Items Addressed This Visit       Cardiology Problems   Atrial flutter ([place])   Hyperlipidemia LDL goal <70   Coronary artery disease   Other Visit Diagnoses     SVT (supraventricular tachycardia)    -  Primary   Dyspnea, unspecified type       Near syncope         Atrial flutter Underwent ablation with EP Off beta-blocker secondary to hypotension Uses cardiomobile to track any symptom  SVT Difficulty tolerating beta-blocker secondary to low blood pressure Followed by EP Recent ablation for SVT  Sinus tachycardia Rare episodes of tachycardia, short-lived We will typically sit down and they resolved without intervention, has not seen the need to take her diltiazem Previously difficulty tolerating metoprolol  Coronary artery disease with stable angina September 2022 stent placement to her LAD, on aspirin Plavix Currently with no symptoms of angina. No further workup at this time. Continue current medication regimen.  Non-smoker, no diabetes,  cholesterol at goal  Hyperlipidemia Crestor 20,  Cholesterol is at goal on the current lipid regimen. No changes to the medications were made.    total encounter time more than 30 minutes  Greater than 50% was spent in counseling and coordination of care with the patient    Signed, Esmond Plants, M.D., Ph.D. [place], [place]

## 2022-01-16 ENCOUNTER — Encounter: Payer: Self-pay | Admitting: Cardiovascular Disease

## 2022-01-16 ENCOUNTER — Ambulatory Visit: Payer: Medicare PPO | Attending: Cardiovascular Disease | Admitting: Cardiovascular Disease

## 2022-01-16 VITALS — BP 150/84 | HR 66 | Ht 65.0 in | Wt 114.2 lb

## 2022-01-16 DIAGNOSIS — E785 Hyperlipidemia, unspecified: Secondary | ICD-10-CM | POA: Diagnosis not present

## 2022-01-16 DIAGNOSIS — I2510 Atherosclerotic heart disease of native coronary artery without angina pectoris: Secondary | ICD-10-CM | POA: Diagnosis not present

## 2022-01-16 DIAGNOSIS — R0600 Dyspnea, unspecified: Secondary | ICD-10-CM | POA: Diagnosis not present

## 2022-01-16 DIAGNOSIS — I25118 Atherosclerotic heart disease of native coronary artery with other forms of angina pectoris: Secondary | ICD-10-CM | POA: Diagnosis not present

## 2022-01-16 DIAGNOSIS — I4892 Unspecified atrial flutter: Secondary | ICD-10-CM

## 2022-01-16 DIAGNOSIS — I483 Typical atrial flutter: Secondary | ICD-10-CM

## 2022-01-16 DIAGNOSIS — I471 Supraventricular tachycardia, unspecified: Secondary | ICD-10-CM

## 2022-01-16 DIAGNOSIS — R55 Syncope and collapse: Secondary | ICD-10-CM | POA: Diagnosis not present

## 2022-01-16 MED ORDER — ROSUVASTATIN CALCIUM 20 MG PO TABS: 20.0000 mg | ORAL_TABLET | Freq: Every morning | ORAL | 2 refills | Status: DC

## 2022-01-16 MED ORDER — CLOPIDOGREL BISULFATE 75 MG PO TABS: 75.0000 mg | ORAL_TABLET | Freq: Every day | ORAL | 2 refills | Status: DC

## 2022-01-16 NOTE — Patient Instructions (Signed)
Medication Instructions:  No changes  If you need a refill on your cardiac medications before your next appointment, please call your pharmacy.   Lab work: No new labs needed  Testing/Procedures: No new testing needed  Follow-Up: At [facility], you and your health needs are our priority.  As part of our continuing mission to provide you with exceptional heart care, we have created designated Provider Care Teams.  These Care Teams include your primary Cardiologist (physician) and Advanced Practice Providers (APPs -  Physician Assistants and Nurse Practitioners) who all work together to provide you with the care you need, when you need it.  You will need a follow up appointment in 12 months  Providers on your designated Care Team:   Christopher Berge, NP Ryan Dunn, PA-C Cadence Furth, PA-C  COVID-19 Vaccine Information can be found at: [email or web address] For questions related to vaccine distribution or appointments, please email [email or web address] or call [phone].   

## 2022-01-17 ENCOUNTER — Other Ambulatory Visit: Payer: Medicare PPO

## 2022-03-07 ENCOUNTER — Inpatient Hospital Stay: Payer: Medicare PPO | Attending: Hematology and Oncology | Admitting: Hematology and Oncology

## 2022-03-07 ENCOUNTER — Other Ambulatory Visit: Payer: Self-pay

## 2022-03-07 VITALS — BP 164/62 | HR 65 | Temp 97.2°F | Resp 18 | Wt 116.1 lb

## 2022-03-07 DIAGNOSIS — Z170 Estrogen receptor positive status [ER+]: Secondary | ICD-10-CM | POA: Diagnosis not present

## 2022-03-07 DIAGNOSIS — C50411 Malignant neoplasm of upper-outer quadrant of right female breast: Secondary | ICD-10-CM | POA: Insufficient documentation

## 2022-03-07 DIAGNOSIS — M810 Age-related osteoporosis without current pathological fracture: Secondary | ICD-10-CM | POA: Diagnosis not present

## 2022-03-07 DIAGNOSIS — Z923 Personal history of irradiation: Secondary | ICD-10-CM | POA: Insufficient documentation

## 2022-03-07 NOTE — Assessment & Plan Note (Addendum)
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  She does not want to take any antiestrogen therapy  Ablation Nov 2021: for A.Fib She also had 2 heart catheterizations for calcifications.   Breast cancer surveillance: 1.  Mammogram 09/13/2021: Benign breast density category D 2.  Annual breast exams: 03/07/2022: Benign 3.  Breast MRI 12/20/2020: No evidence of malignancy.  Breast density category D MRIs will be ordered for October 2024.  After that breast MRIs can be done every 3 years.  This is mainly for high degree of breast density.   Bone density 12/09/2021: T-score -3.5: Osteoporosis: She is currently taking raloxifene.  She would like her mammograms and breast MR[facility] to [facility] at [facility].   Return to clinic in 1 year for follow-up and after that she could be seen on an as-needed basis.

## 2022-03-07 NOTE — Progress Notes (Signed)
Patient Care Team: Caryl Bis, MD as PCP - General (Family Medicine) Rockey Situ Kathlene November, MD as PCP - Cardiology (Cardiology) Constance Haw, MD as PCP - Electrophysiology (Cardiology)  DIAGNOSIS:  Encounter Diagnosis  Name Primary?   Malignant neoplasm of upper-outer quadrant of right breast in female, estrogen receptor positive ([place]) Yes    SUMMARY OF ONCOLOGIC HISTORY: Oncology History  Malignant neoplasm of upper-outer quadrant of right breast in female, estrogen receptor positive ([place])  10/09/2017 Initial Diagnosis   Palpable lump in the right breast with a history of right breast atypia in mother and sister with breast cancers, 1.9 cm mass at 10 o'clock position right breast biopsy 10 o'clock position 5 cm from nipple: Grade 1 invasive ductal carcinoma ER 60%, PR 40%, Ki-67 2%, HER-2 negative ratio 1.35 T1cN1a stage Ib AJCC 8   11/26/2017 Surgery   Right lumpectomy: Grade 2 IDC 1.9 cm, margins negative, 1/2 lymph nodes positive, ER 60%, PR 40%, HER-2 2+ by IHC negative by FISH ratio 1.35, copy #2.1, Ki-67 2%, T1cN1a stage IA    12/03/2017 Cancer Staging   Staging form: Breast, AJCC 8th Edition - Pathologic: Stage IA (pT1c, pN1a, cM0, G2, ER+, PR+, HER2-) - Signed by Nicholas Lose, MD on 12/03/2017   01/17/2018 - 02/25/2018 Radiation Therapy   Adj XRT at [place] COMPLIANT: Follow-up of right breast cancer   INTERVAL HISTORY: Theresa Hale is a 67 y.o. with above-mentioned history of right breast cancer who underwent a lumpectomy, radiation, and was unable to tolerate letrozole. MRI Breast on 12/20/2020 showed no evidence of malignancy bilaterally. She presents to the clinic today for follow-up. She reports that she has had 2 Afib's. She states that it is doing much better. She complains of the breast, still having the jabbing pain. She complains of a raise scar on the forehead.   ALLERGIES:  is allergic to penicillins, erythromycin, sulfa antibiotics,  nitrofurantoin, penicillin g, and sulfasalazine.  MEDICATIONS:  Current Outpatient Medications  Medication Sig Dispense Refill   Ascorbic Acid (VITAMIN C) 1000 MG tablet Take 1 tablet (1,000 mg total) by mouth daily. (Patient taking differently: Take 500 mg by mouth daily.)     aspirin EC 81 MG tablet Take 1 tablet (81 mg total) by mouth daily. Swallow whole. 90 tablet 3   Calcium 600-400 MG-UNIT CHEW Chew 1 tablet by mouth in the morning.     Cholecalciferol (VITAMIN D) 125 MCG (5000 UT) CAPS Take 1 capsule by mouth daily. (Patient taking differently: Take 2,000 capsules by mouth daily.) 30 capsule    clopidogrel (PLAVIX) 75 MG tablet Take 1 tablet (75 mg total) by mouth daily. 90 tablet 2   diltiazem (CARDIZEM) 30 MG tablet Take one tablet as needed for fast heart rate 30 tablet 0   Elderberry 500 MG CAPS Take 1 capsule by mouth daily. (Patient taking differently: Take 500 mg by mouth daily. gummy)     famotidine (PEPCID) 20 MG tablet Take 1 tablet (20 mg total) by mouth at bedtime. 90 tablet 3   raloxifene (EVISTA) 60 MG tablet Take 60 mg by mouth daily.     rosuvastatin (CRESTOR) 20 MG tablet Take 1 tablet (20 mg total) by mouth every morning. 90 tablet 2   No current facility-administered medications for this visit.    PHYSICAL EXAMINATION: ECOG PERFORMANCE STATUS: 1 - Symptomatic but completely ambulatory  Vitals:   03/07/22 0957  BP: (!) 164/62  Pulse: 65  Resp: 18  Temp: (!) 97.2 F (36.2 C)  SpO2: 100%   Filed Weights   03/07/22 0957  Weight: 116 lb 2 oz (52.7 kg)    BREAST: No palpable masses or nodules in either right or left breasts. No palpable axillary supraclavicular or infraclavicular adenopathy no breast tenderness or nipple discharge. (exam performed in the presence of a chaperone)  LABORATORY DATA:  I have reviewed the data as listed    Latest Ref Rng & Units 07/25/2021    9:20 AM 05/04/2021    8:39 AM 10/16/2020    4:20 AM  CMP  Glucose 70 - 99 mg/dL 74   102  97   BUN 8 - 23 mg/dL '16  18  14   '$ Creatinine 0.44 - 1.00 mg/dL 0.69  0.86  0.62   Sodium 135 - 145 mmol/L 138  137  136   Potassium 3.5 - 5.1 mmol/L 3.8  4.4  4.1   Chloride 98 - 111 mmol/L 100  100  102   CO2 22 - 32 mmol/L '30  29  27   '$ Calcium 8.9 - 10.3 mg/dL 9.7  9.7  9.1   Total Protein 6.5 - 8.1 g/dL  7.8    Total Bilirubin 0.3 - 1.2 mg/dL  0.8    Alkaline Phos 38 - 126 U/L  123    AST 15 - 41 U/L  25    ALT 0 - 44 U/L  21      Lab Results  Component Value Date   WBC 4.3 07/25/2021   HGB 14.8 07/25/2021   HCT 44.3 07/25/2021   MCV 84.7 07/25/2021   PLT 174 07/25/2021   NEUTROABS 4.4 10/04/2020    ASSESSMENT & PLAN:  Malignant neoplasm of upper-outer quadrant of right breast in female, estrogen receptor positive ([place]) 11/26/2017 right lumpectomy: Grade 2 IDC 1.9 cm, margins negative, 1/2 lymph nodes positive, ER 60%, PR 40%, HER-2 2+ by IHC negative by FISH ratio 1.35, copy #2.1, Ki-67 2%, T1cN1a stage IA Patient had Mammaprint prior to surgery and it was low risk luminal type A Adjuvant radiation therapy at [facility] completed 02/25/2018   Antiestrogen therapy: Patient took neoadjuvant letrozole and could not tolerate it.  She does not want to take any antiestrogen therapy  Ablation Nov 2021: for A.Fib She also had 2 heart catheterizations for calcifications.   Breast cancer surveillance: 1.  Mammogram 09/13/2021: Benign breast density category D 2.  Annual breast exams: 03/07/2022: Benign 3.  Breast MRI 12/20/2020: No evidence of malignancy.  Breast density category D MRIs will be ordered for October 2024.  After that breast MRIs can be done every 3 years.  This is mainly for high degree of breast density.   Bone density 12/09/2021: T-score -3.5: Osteoporosis: She is currently taking raloxifene.  She would like her mammograms and breast MRIs to [facility] done at [facility].   Return to clinic in 1 year for follow-up and after that she could be seen on an as-needed basis.       Orders Placed This Encounter  Procedures   MR BREAST BILATERAL W WO CONTRAST INC CAD    Standing Status:   Future    Standing Expiration Date:   03/08/2023    Order Specific Question:   If indicated for the ordered procedure, I authorize the administration of contrast media per Radiology protocol    Answer:   Yes    Order Specific Question:   What is the patient's sedation requirement?  Answer:   No Sedation    Order Specific Question:   Does the patient have a pacemaker or implanted devices?    Answer:   No    Order Specific Question:   Preferred imaging location?    Answer:   [facility]   The patient has a good understanding of the overall plan. she agrees with it. she will call with any problems that may develop before the next visit here. Total time spent: 30 mins including face to face time and time spent for planning, charting and co-ordination of care   Harriette Ohara, MD 03/07/22    I Gardiner Coins am acting as a Education administrator for [employer]  I have reviewed the above documentation for accuracy and completeness, and I agree with the above.

## 2022-03-13 ENCOUNTER — Other Ambulatory Visit: Payer: Self-pay | Admitting: *Deleted

## 2022-03-13 DIAGNOSIS — C50411 Malignant neoplasm of upper-outer quadrant of right female breast: Secondary | ICD-10-CM

## 2022-03-13 DIAGNOSIS — Z9189 Other specified personal risk factors, not elsewhere classified: Secondary | ICD-10-CM

## 2022-03-13 NOTE — Progress Notes (Signed)
Received call from pt stating breast MRI was ordered for the wrong locations.  She prefers [facility] and scan was ordered for [facility].  Orders changed.

## 2022-03-25 ENCOUNTER — Other Ambulatory Visit: Payer: Self-pay | Admitting: Nurse Practitioner

## 2022-03-27 NOTE — Telephone Encounter (Signed)
Please advise if ok to refill Pepcid last filled by Sharolyn Douglas.

## 2022-04-21 ENCOUNTER — Other Ambulatory Visit: Payer: Self-pay

## 2022-04-21 ENCOUNTER — Other Ambulatory Visit: Payer: Self-pay | Admitting: Hematology and Oncology

## 2022-04-21 ENCOUNTER — Telehealth: Payer: Self-pay

## 2022-04-21 DIAGNOSIS — Z170 Estrogen receptor positive status [ER+]: Secondary | ICD-10-CM

## 2022-04-21 DIAGNOSIS — Z1231 Encounter for screening mammogram for malignant neoplasm of breast: Secondary | ICD-10-CM

## 2022-04-21 DIAGNOSIS — Z9189 Other specified personal risk factors, not elsewhere classified: Secondary | ICD-10-CM

## 2022-04-21 NOTE — Telephone Encounter (Signed)
Returned Pt's call regarding breast MRI. Pt asked if MRI could be moved from November to September. MRI reordered to reflect change in expected date. Gave Pt central scheduling number and instructed Pt to call us back when MRI is scheduled so we can reschedule MD appt. Pt verbalized understanding.

## 2022-05-10 ENCOUNTER — Ambulatory Visit
Admission: RE | Admit: 2022-05-10 | Discharge: 2022-05-10 | Disposition: A | Discharge status: Home / Self Care | Payer: Medicare PPO | Source: Ambulatory Visit | Attending: Hematology and Oncology | Admitting: Hematology and Oncology

## 2022-05-10 ENCOUNTER — Telehealth: Payer: Self-pay | Admitting: *Deleted

## 2022-05-10 DIAGNOSIS — Z853 Personal history of malignant neoplasm of breast: Secondary | ICD-10-CM | POA: Diagnosis not present

## 2022-05-10 DIAGNOSIS — C50411 Malignant neoplasm of upper-outer quadrant of right female breast: Secondary | ICD-10-CM | POA: Insufficient documentation

## 2022-05-10 DIAGNOSIS — Z9189 Other specified personal risk factors, not elsewhere classified: Secondary | ICD-10-CM | POA: Diagnosis not present

## 2022-05-10 DIAGNOSIS — Z1239 Encounter for other screening for malignant neoplasm of breast: Secondary | ICD-10-CM | POA: Diagnosis not present

## 2022-05-10 DIAGNOSIS — Z170 Estrogen receptor positive status [ER+]: Secondary | ICD-10-CM | POA: Insufficient documentation

## 2022-05-10 MED ORDER — GADOBUTROL 1 MMOL/ML IV SOLN
5.0000 mL | Freq: Once | INTRAVENOUS | Status: AC | PRN
Start: 2022-05-10 — End: 2022-05-10
  Administered 2022-05-10: 5 mL via INTRAVENOUS

## 2022-05-10 NOTE — Telephone Encounter (Signed)
-----   Message from Gardenia Phlegm, NP sent at 05/10/2022  3:26 PM EDT ----- Please call patient and let her know that her screening MRI did not show any concerns in either breast.  Germaine Pomfret, [place] ----- Message ----- From: Interface, Rad Results In Sent: 05/10/2022  10:00 AM EDT To: Nicholas Lose, MD

## 2022-05-10 NOTE — Telephone Encounter (Signed)
RN placed call to pt to relay below information per NP.  Pt educated and verbalized understanding.  Pt requesting to cancel upcoming MD visit to discuss MRI.  Appt canceled as MRI is WNL.

## 2022-05-23 ENCOUNTER — Telehealth: Payer: Medicare PPO | Admitting: Hematology and Oncology

## 2022-05-23 DIAGNOSIS — I4892 Unspecified atrial flutter: Secondary | ICD-10-CM | POA: Diagnosis not present

## 2022-05-23 DIAGNOSIS — D649 Anemia, unspecified: Secondary | ICD-10-CM | POA: Diagnosis not present

## 2022-05-23 DIAGNOSIS — E039 Hypothyroidism, unspecified: Secondary | ICD-10-CM | POA: Diagnosis not present

## 2022-05-23 DIAGNOSIS — E559 Vitamin D deficiency, unspecified: Secondary | ICD-10-CM | POA: Diagnosis not present

## 2022-05-23 DIAGNOSIS — Z0001 Encounter for general adult medical examination with abnormal findings: Secondary | ICD-10-CM | POA: Diagnosis not present

## 2022-05-23 DIAGNOSIS — R413 Other amnesia: Secondary | ICD-10-CM | POA: Diagnosis not present

## 2022-05-23 DIAGNOSIS — K760 Fatty (change of) liver, not elsewhere classified: Secondary | ICD-10-CM | POA: Diagnosis not present

## 2022-05-23 DIAGNOSIS — E538 Deficiency of other specified B group vitamins: Secondary | ICD-10-CM | POA: Diagnosis not present

## 2022-05-23 DIAGNOSIS — R202 Paresthesia of skin: Secondary | ICD-10-CM | POA: Diagnosis not present

## 2022-06-10 ENCOUNTER — Other Ambulatory Visit: Payer: Self-pay | Admitting: Nurse Practitioner

## 2022-06-12 ENCOUNTER — Other Ambulatory Visit: Payer: Self-pay | Admitting: *Deleted

## 2022-06-12 MED ORDER — FAMOTIDINE 20 MG PO TABS: 20.0000 mg | ORAL_TABLET | Freq: Every day | ORAL | 0 refills | Status: DC

## 2022-06-12 NOTE — Telephone Encounter (Signed)
Pt refused to purchase OTC pt prefers to be ordered through Pharmacy. Verbal received by Bernette Mayers, RN to refill at this time.

## 2022-06-12 NOTE — Telephone Encounter (Signed)
Please advise if ok to refill. 

## 2022-06-12 NOTE — Telephone Encounter (Signed)
Please advise if ok to refill Pepcid. 

## 2022-06-13 ENCOUNTER — Encounter: Payer: Self-pay | Admitting: Obstetrics and Gynecology

## 2022-06-13 DIAGNOSIS — Z1211 Encounter for screening for malignant neoplasm of colon: Secondary | ICD-10-CM | POA: Diagnosis not present

## 2022-06-13 DIAGNOSIS — Z1239 Encounter for other screening for malignant neoplasm of breast: Secondary | ICD-10-CM | POA: Diagnosis not present

## 2022-06-13 DIAGNOSIS — M810 Age-related osteoporosis without current pathological fracture: Secondary | ICD-10-CM | POA: Diagnosis not present

## 2022-06-13 DIAGNOSIS — Z681 Body mass index (BMI) 19 or less, adult: Secondary | ICD-10-CM | POA: Diagnosis not present

## 2022-06-13 DIAGNOSIS — Z90711 Acquired absence of uterus with remaining cervical stump: Secondary | ICD-10-CM | POA: Diagnosis not present

## 2022-06-13 DIAGNOSIS — N952 Postmenopausal atrophic vaginitis: Secondary | ICD-10-CM | POA: Diagnosis not present

## 2022-06-13 DIAGNOSIS — Z01411 Encounter for gynecological examination (general) (routine) with abnormal findings: Secondary | ICD-10-CM | POA: Diagnosis not present

## 2022-06-13 DIAGNOSIS — B354 Tinea corporis: Secondary | ICD-10-CM | POA: Diagnosis not present

## 2022-06-22 ENCOUNTER — Other Ambulatory Visit: Payer: Self-pay | Admitting: Nurse Practitioner

## 2022-08-16 DIAGNOSIS — I872 Venous insufficiency (chronic) (peripheral): Secondary | ICD-10-CM | POA: Diagnosis not present

## 2022-08-16 DIAGNOSIS — L570 Actinic keratosis: Secondary | ICD-10-CM | POA: Diagnosis not present

## 2022-08-16 DIAGNOSIS — D225 Melanocytic nevi of trunk: Secondary | ICD-10-CM | POA: Diagnosis not present

## 2022-08-16 DIAGNOSIS — D2261 Melanocytic nevi of right upper limb, including shoulder: Secondary | ICD-10-CM | POA: Diagnosis not present

## 2022-08-16 DIAGNOSIS — D2271 Melanocytic nevi of right lower limb, including hip: Secondary | ICD-10-CM | POA: Diagnosis not present

## 2022-08-16 DIAGNOSIS — L821 Other seborrheic keratosis: Secondary | ICD-10-CM | POA: Diagnosis not present

## 2022-08-16 DIAGNOSIS — D2262 Melanocytic nevi of left upper limb, including shoulder: Secondary | ICD-10-CM | POA: Diagnosis not present

## 2022-08-16 DIAGNOSIS — L538 Other specified erythematous conditions: Secondary | ICD-10-CM | POA: Diagnosis not present

## 2022-08-16 DIAGNOSIS — D2272 Melanocytic nevi of left lower limb, including hip: Secondary | ICD-10-CM | POA: Diagnosis not present

## 2022-08-16 DIAGNOSIS — L820 Inflamed seborrheic keratosis: Secondary | ICD-10-CM | POA: Diagnosis not present

## 2022-09-05 ENCOUNTER — Other Ambulatory Visit: Payer: Self-pay | Admitting: Nurse Practitioner

## 2022-09-05 NOTE — Telephone Encounter (Signed)
Good Morning, Please advise it ok to refill prescription or defer to PCP? Medication is currently prescribed under [facility]. Thank you so much.

## 2022-09-15 DIAGNOSIS — H35432 Paving stone degeneration of retina, left eye: Secondary | ICD-10-CM | POA: Diagnosis not present

## 2022-09-15 DIAGNOSIS — H16223 Keratoconjunctivitis sicca, not specified as Sjogren's, bilateral: Secondary | ICD-10-CM | POA: Diagnosis not present

## 2022-09-15 DIAGNOSIS — H2513 Age-related nuclear cataract, bilateral: Secondary | ICD-10-CM | POA: Diagnosis not present

## 2022-10-04 IMAGING — MR MR BREAST BILAT WO/W CM
3 of 10 series · 12 of 48 positions shown · IV contrast (gadavist)
Comparison: Previous examinations, including bilateral screening
mammogram dated 09/10/2020 and bilateral breast MRI dated 02/03/2019

CLINICAL DATA: High risk screening MRI of the breasts. Status post
right lumpectomy and radiation therapy for breast cancer in 6440.

EXAM:
BILATERAL BREAST MRI WITH AND WITHOUT CONTRAST
TECHNIQUE: Multiplanar, multisequence MR images of both breasts were obtained
prior to and following the intravenous administration of 5 ml of
Gadavist

[Series 2: T1 · axial · B · 1.5mm · 0.97mm/px · z∈[-90,+76]mm · 6 of 112 slices shown]
[im 1/112]
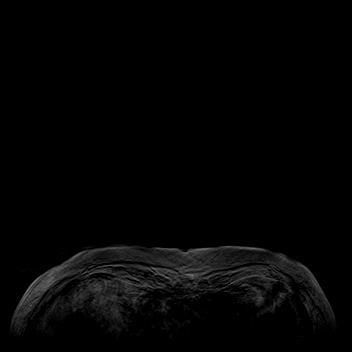
[im 23/112]
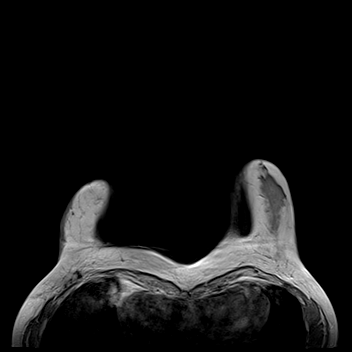
[im 45/112]
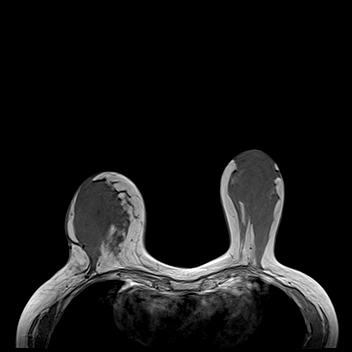
[im 67/112]
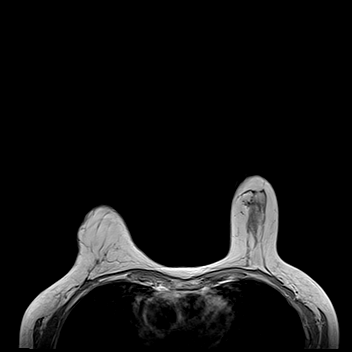
[im 89/112]
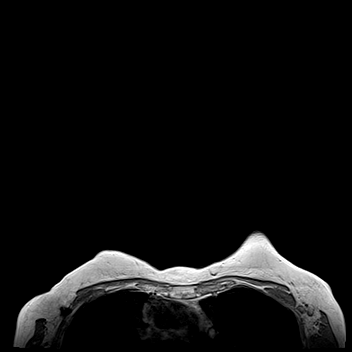
[im 112/112]
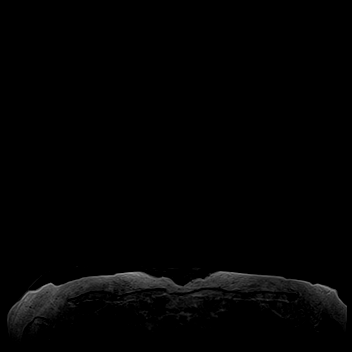

[Series 3: T2 · axial · B · 3.0mm · 0.99mm/px · z∈[-88,+74]mm · 3 of 46 slices shown (1 of 2)]
[im 1/46]
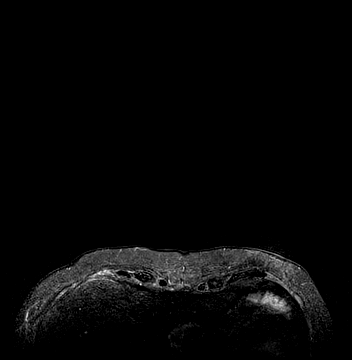
[im 23/46]
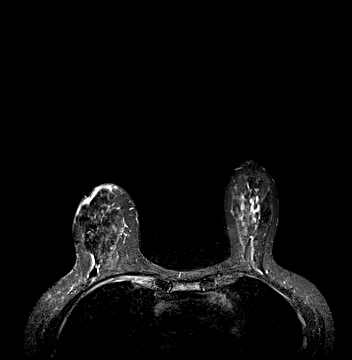
[im 46/46]
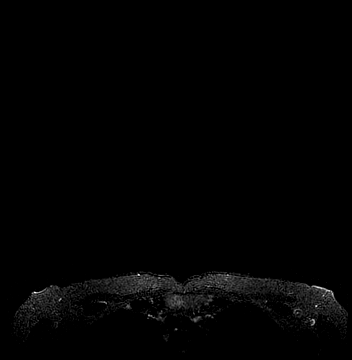

[Series 5: T2 · axial · B · 3.0mm · 0.99mm/px · z∈[-88,+74]mm · 3 of 46 slices shown (2 of 2)]
[im 1/46]
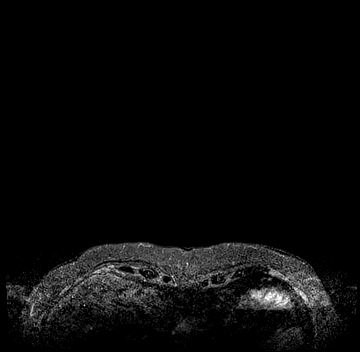
[im 23/46]
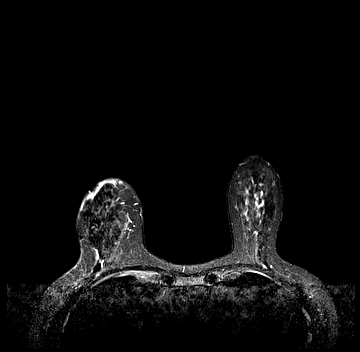
[im 46/46]
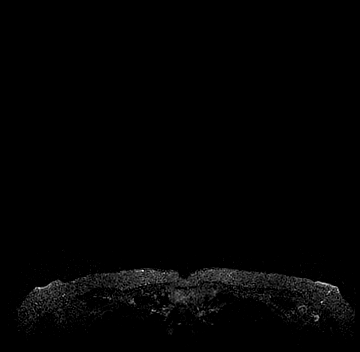

[12 of 48 positions shown; findings below may reference images not displayed]

Three-dimensional MR images were rendered by post-processing of the
original MR data on an independent workstation. The
three-dimensional MR images were interpreted, and findings are
reported in the following complete MRI report for this study. Three
dimensional images were evaluated at the independent interpreting
workstation using the DynaCAD thin client.
FINDINGS: Breast composition: d. Extreme fibroglandular tissue.

Background parenchymal enhancement: Minimal

Right breast: Stable post lumpectomy changes. The previously
demonstrated small enhancing skin lesion in the medial right breast
is unchanged. No mass or enhancement suspicious for malignancy.

Left breast: No mass or enhancement suspicious for malignancy.

Lymph nodes: No abnormal appearing lymph nodes.

Ancillary findings:  None.
IMPRESSION: No evidence of malignancy.

RECOMMENDATION:
Bilateral screening mammogram in August 2021 when due.

BI-RADS CATEGORY  2: Benign.

## 2022-10-06 ENCOUNTER — Telehealth: Payer: Self-pay | Admitting: Cardiovascular Disease

## 2022-10-06 NOTE — Telephone Encounter (Signed)
Pt c/o swelling/edema: STAT if pt has developed SOB within 24 hours  If swelling, where is the swelling located? Right leg and ankle  How much weight have you gained and in what time span? NA  Have you gained 2 pounds in a day or 5 pounds in a week? No  Do you have a log of your daily weights (if so, list)? No  Are you currently taking a fluid pill? No  Are you currently SOB? No  Have you traveled recently in a car or plane for an extended period of time? No

## 2022-10-06 NOTE — Telephone Encounter (Signed)
Returned the call to the patient. She stated that she has had right foot swelling. She denies pain, temperature change and discoloration.  The patient did state that her mother in law fell a week ago and they have been taking care of her which requires a lot of sitting for the patient. She has been advised to try and elevate her foot when she can. She also stated that the swelling does not go down overnight.   For any worsening symptoms, she has been advised to have it assessed over the weekend.

## 2022-10-10 NOTE — Telephone Encounter (Signed)
Called and spoke with patient. Informed her of the following from Dr. Mariah Milling.  Try compression hose and leg elevation thxTG       Patient verbalizes understanding.

## 2022-10-16 ENCOUNTER — Other Ambulatory Visit: Payer: Self-pay | Admitting: Cardiovascular Disease

## 2022-10-24 ENCOUNTER — Ambulatory Visit
Admission: RE | Admit: 2022-10-24 | Discharge: 2022-10-24 | Disposition: A | Discharge status: Home / Self Care | Payer: Medicare PPO | Source: Ambulatory Visit | Attending: Hematology and Oncology | Admitting: Hematology and Oncology

## 2022-10-24 DIAGNOSIS — Z1231 Encounter for screening mammogram for malignant neoplasm of breast: Secondary | ICD-10-CM | POA: Diagnosis not present

## 2022-12-02 ENCOUNTER — Other Ambulatory Visit: Payer: Self-pay | Admitting: Cardiovascular Disease

## 2022-12-02 ENCOUNTER — Other Ambulatory Visit: Payer: Self-pay | Admitting: Nurse Practitioner

## 2022-12-07 DIAGNOSIS — Z681 Body mass index (BMI) 19 or less, adult: Secondary | ICD-10-CM | POA: Diagnosis not present

## 2022-12-07 DIAGNOSIS — K760 Fatty (change of) liver, not elsewhere classified: Secondary | ICD-10-CM | POA: Diagnosis not present

## 2022-12-07 DIAGNOSIS — D649 Anemia, unspecified: Secondary | ICD-10-CM | POA: Diagnosis not present

## 2022-12-07 DIAGNOSIS — E538 Deficiency of other specified B group vitamins: Secondary | ICD-10-CM | POA: Diagnosis not present

## 2022-12-07 DIAGNOSIS — I4892 Unspecified atrial flutter: Secondary | ICD-10-CM | POA: Diagnosis not present

## 2022-12-07 DIAGNOSIS — R202 Paresthesia of skin: Secondary | ICD-10-CM | POA: Diagnosis not present

## 2022-12-07 DIAGNOSIS — R413 Other amnesia: Secondary | ICD-10-CM | POA: Diagnosis not present

## 2023-01-04 ENCOUNTER — Telehealth: Payer: Medicare PPO | Admitting: Hematology and Oncology

## 2023-01-13 ENCOUNTER — Other Ambulatory Visit: Payer: Self-pay | Admitting: Cardiovascular Disease

## 2023-01-15 NOTE — Telephone Encounter (Signed)
Please contact pt for future appointment. Pt due for 12 month f/u. 

## 2023-01-16 DIAGNOSIS — J329 Chronic sinusitis, unspecified: Secondary | ICD-10-CM | POA: Diagnosis not present

## 2023-01-16 DIAGNOSIS — R030 Elevated blood-pressure reading, without diagnosis of hypertension: Secondary | ICD-10-CM | POA: Diagnosis not present

## 2023-01-16 DIAGNOSIS — Z20828 Contact with and (suspected) exposure to other viral communicable diseases: Secondary | ICD-10-CM | POA: Diagnosis not present

## 2023-01-16 DIAGNOSIS — Z6820 Body mass index (BMI) 20.0-20.9, adult: Secondary | ICD-10-CM | POA: Diagnosis not present

## 2023-01-16 DIAGNOSIS — R059 Cough, unspecified: Secondary | ICD-10-CM | POA: Diagnosis not present

## 2023-01-16 NOTE — Telephone Encounter (Signed)
Pt scheduled on 12/2

## 2023-01-31 DIAGNOSIS — B078 Other viral warts: Secondary | ICD-10-CM | POA: Diagnosis not present

## 2023-01-31 DIAGNOSIS — L814 Other melanin hyperpigmentation: Secondary | ICD-10-CM | POA: Diagnosis not present

## 2023-01-31 DIAGNOSIS — L308 Other specified dermatitis: Secondary | ICD-10-CM | POA: Diagnosis not present

## 2023-01-31 DIAGNOSIS — L905 Scar conditions and fibrosis of skin: Secondary | ICD-10-CM | POA: Diagnosis not present

## 2023-01-31 DIAGNOSIS — I788 Other diseases of capillaries: Secondary | ICD-10-CM | POA: Diagnosis not present

## 2023-01-31 DIAGNOSIS — D485 Neoplasm of uncertain behavior of skin: Secondary | ICD-10-CM | POA: Diagnosis not present

## 2023-01-31 DIAGNOSIS — C44319 Basal cell carcinoma of skin of other parts of face: Secondary | ICD-10-CM | POA: Diagnosis not present

## 2023-02-28 ENCOUNTER — Other Ambulatory Visit: Payer: Self-pay | Admitting: Cardiovascular Disease

## 2023-02-28 ENCOUNTER — Other Ambulatory Visit: Payer: Self-pay | Admitting: Nurse Practitioner

## 2023-02-28 NOTE — Telephone Encounter (Signed)
 Please advise if ok to refill or defer to PCP. C.Berge originally prescribed the patient Famotidine  on 04-05-2021. Thank you so much.

## 2023-03-07 ENCOUNTER — Ambulatory Visit: Payer: Medicare PPO | Attending: Nurse Practitioner | Admitting: Nurse Practitioner

## 2023-03-07 ENCOUNTER — Encounter: Payer: Self-pay | Admitting: Nurse Practitioner

## 2023-03-07 VITALS — BP 138/80 | HR 65 | Ht 65.0 in | Wt 123.4 lb

## 2023-03-07 DIAGNOSIS — E785 Hyperlipidemia, unspecified: Secondary | ICD-10-CM

## 2023-03-07 DIAGNOSIS — I2510 Atherosclerotic heart disease of native coronary artery without angina pectoris: Secondary | ICD-10-CM | POA: Diagnosis not present

## 2023-03-07 DIAGNOSIS — I483 Typical atrial flutter: Secondary | ICD-10-CM | POA: Diagnosis not present

## 2023-03-07 DIAGNOSIS — I471 Supraventricular tachycardia, unspecified: Secondary | ICD-10-CM

## 2023-03-07 DIAGNOSIS — R000 Tachycardia, unspecified: Secondary | ICD-10-CM | POA: Diagnosis not present

## 2023-03-07 NOTE — Patient Instructions (Signed)
Medication Instructions:  No changes *If you need a refill on your cardiac medications before your next appointment, please call your pharmacy*   Lab Work: None ordered If you have labs (blood work) drawn today and your tests are completely normal, you will receive your results only by: MyChart Message (if you have MyChart) OR A paper copy in the mail If you have any lab test that is abnormal or we need to change your treatment, we will call you to review the results.   Testing/Procedures: None ordered   Follow-Up: At [facility], you and your health needs are our priority.  As part of our continuing mission to provide you with exceptional heart care, we have created designated Provider Care Teams.  These Care Teams include your primary Cardiologist (physician) and Advanced Practice Providers (APPs -  Physician Assistants and Nurse Practitioners) who all work together to provide you with the care you need, when you need it.  We recommend signing up for the patient portal called "MyChart".  Sign up information is provided on this After Visit Summary.  MyChart is used to connect with patients for Virtual Visits (Telemedicine).  Patients are able to view lab/test results, encounter notes, upcoming appointments, etc.  Non-urgent messages can be sent to your provider as well.   To learn more about what you can do with MyChart, go to [email or web address].    Your next appointment:   12 month(s)  Provider:   You may see Julien Nordmann, MD or one of the following Advanced Practice Providers on your designated Care Team:   Nicolasa Ducking, NP

## 2023-03-07 NOTE — Progress Notes (Signed)
Office Visit    Patient Name: Theresa Hale Date of Encounter: 03/07/2023  Primary Care Provider:  Richardean Chimera, MD Primary Cardiologist:  Theresa Nordmann, MD  Chief Complaint    68 y.o. female with a history of atrial flutter, breast cancer, CAD, hyperlipidemia, GERD, and PSVT/AVNRT, who presents for follow-up related to CAD.  Past Medical History  Subjective   Past Medical History:  Diagnosis Date   Atrial flutter (HCC)    a. Dx in 2018. Initially only a single episode. (CHA2DS2VASc = 2); b. 11/2020 Recurrent Aflutter s/p DCCV. Eliquis started; c. 12/2020 s/p RFCA; d. 01/2021 Eliquis d/c'd.   AVNRT (AV nodal re-entry tachycardia) (HCC)    a.  07/2021 s/p successful RFCA.   Breast cancer (HCC)    CAD (coronary artery disease)    a. 07/2019 Cardiac CT: Ca2+ 262 (90th %'ile); b. 09/2020 Cor CTA: Sev LAD dzs w/ abnl FFR; c. 10/2020 PCI: LM nl, LAD 80p/16m (shockwave lithotripsy + 2.5x38 Onyx DES), LCX mild diff dzs, RCA mild diff dzs.   GERD (gastroesophageal reflux disease)    History of echocardiogram    a. 06/2016 Echo: EF 60-65%, no rwma. Mild TR; b. 04/2021 Echo: EF 60-65%, no rwma, nl RV fxn, RVSP 27.7mmHg.   Personal history of radiation therapy 02/2018   PONV (postoperative nausea and vomiting)    PSVT (paroxysmal supraventricular tachycardia) (HCC)    a. 06/2019 Zio: RSR, avg HR 71, max HR 222. 97 runs of SVT, fastest 222 (4 beats), longest 1:05 (164); b. 07/2020 Zio: Predominantly sinus rhythm @ 67 (49-197). 42 SVT runs, fastest 197 x 6 beats, longest 11.3 secs @ 127 bpm.  Triggered events associated with sinus tachycardia; c. 07/2021 s/p RFCA.   Past Surgical History:  Procedure Laterality Date   A-FLUTTER ABLATION N/A 12/26/2019   Procedure: A-FLUTTER ABLATION;  Surgeon: Theresa Lemming, MD;  Location: [facility];  Service: Cardiovascular;  Laterality: N/A;   ABDOMINAL HYSTERECTOMY     ABLATION     BREAST BIOPSY Right 10/02/2017   BREAST EXCISIONAL BIOPSY  Right 01/2002   BREAST LUMPECTOMY Right 2019   BREAST LUMPECTOMY WITH RADIOACTIVE SEED AND SENTINEL LYMPH NODE BIOPSY Right 11/26/2017   Procedure: RIGHT BREAST LUMPECTOMY WITH SENTINEL NODE MAPPING AND TARGETED NODE DISECTION ERAS PATHWAY;  Surgeon: Theresa Miner, MD;  Location: [facility];  Service: General;  Laterality: Right;   CESAREAN SECTION     CHOLECYSTECTOMY     CORONARY STENT INTERVENTION N/A 10/15/2020   Procedure: CORONARY STENT INTERVENTION;  Surgeon: Theresa Ouch, MD;  Location: [facility];  Service: Cardiovascular;  Laterality: N/A;   KNEE ARTHROSCOPY     LEFT HEART CATH AND CORONARY ANGIOGRAPHY N/A 10/08/2020   Procedure: LEFT HEART CATH AND CORONARY ANGIOGRAPHY;  Surgeon: Theresa Ouch, MD;  Location: [facility];  Service: Cardiovascular;  Laterality: N/A;   NECK SURGERY     SVT ABLATION N/A 08/10/2021   Procedure: SVT ABLATION;  Surgeon: Theresa Lemming, MD;  Location: [facility];  Service: Cardiovascular;  Laterality: N/A;    Allergies  Allergies  Allergen Reactions   Penicillins Swelling    Reaction: Childhood   Erythromycin Nausea And Vomiting and Nausea Only   Sulfa Antibiotics Nausea And Vomiting and Rash   Nitrofurantoin Nausea Only   Penicillin G Swelling   Sulfasalazine Nausea And Vomiting and Nausea Only      History of Present Illness  68 y.o. y/o female with the above past medical history including atrial flutter, breast cancer, CAD, hyperlipidemia, GERD, and PSVT/AVNRT.  She was diagnosed with atrial flutter in 2018 and was initially placed on beta-blocker with subsequent cardioversion.  Echo in 2018 showed normal LV function and mild TR.  In the spring 2021, event monitoring was performed in the setting of palpitations and she was found to have frequent, brief runs of PSVT.  Beta-blocker usage was limited by relative hypotension and therefore, she took on an as-needed basis only.  She underwent  coronary calcium score in June 2021, and this was elevated at 262 (90th percentile).  She has since been on aspirin and rosuvastatin therapy.  In October 2021, she was seen in clinic with palpitations and rapid atrial flutter.  She was referred to the emergency department and underwent successful cardioversion.  She was subsequently placed on Eliquis.  She was evaluated by Dr. Elberta Hale (EP) and underwent successful catheter ablation for atrial flutter in November 2021.  Following a syncopal spell in May 2022, she underwent event monitoring, which showed predominantly sinus rhythm with 42 SVT runs (maximum duration 11.3 seconds, maximum heart rate 197).  Triggered events were associated with sinus tachycardia.  In the setting of ongoing presyncope and hypotension, beta-blocker therapy was discontinued in June 2022.  In the setting of chest pain in August 2022, she underwent coronary CT angiogram which showed calcium score of 352 (91st percentile), mild stenosis in the OM1, and suggestion of severe stenosis in the proximal LAD.  FFRct was abnormal at 0.71, and she underwent diagnostic catheterization in September 2022 revealing 80% heavily calcified proximal LAD and 70% mid LAD stenosis.  The LAD was successfully treated with shockwave lithotripsy and drug-eluting stent placement.  She had recurrent prolonged peers of palpitations with presyncope in early 2023 and was referred back to Dr. Elberta Hale.  She underwent successful ablation for AVNRT in June 2023.   Theresa Hale was last seen in cardiology clinic in December 2023, at which time she noted occasional elevations in heart rate, felt to be consistent with sinus tachycardia.  Over the past year, she is continue to do reasonably well.  She exercises on an exercise bicycle for 20 minutes most days of the week without symptoms or limitations.  She continues to have occasional elevations in heart rate, sometimes associated with fatigue or dyspnea, almost exclusively  occurring when she gets out of the shower in the morning, lasting about 5 minutes, and resolving spontaneously.  Similar to last year, this occurs maybe about one third of mornings.  She does check her heart rhythm on her Kardia mobile device, and on my review today, it shows sinus rhythm and sinus tachycardia.  She checks her blood pressure regularly at home and typically it runs in the low 100s.  On occasion, she might have pressures drop into the 80s or 90s, associated with fatigue, but this is rare.  She denies chest pain, dyspnea, PND, orthopnea, syncope, edema, or early satiety. Objective  Home Medications    Current Outpatient Medications  Medication Sig Dispense Refill   Ascorbic Acid (VITAMIN C) 1000 MG tablet Take 1 tablet (1,000 mg total) by mouth daily. (Patient taking differently: Take 500 mg by mouth daily.)     aspirin EC 81 MG tablet Take 1 tablet (81 mg total) by mouth daily. Swallow whole. 90 tablet 3   Calcium 600-400 MG-UNIT CHEW Chew 1 tablet by mouth in the morning.     Cholecalciferol (VITAMIN  D) 125 MCG (5000 UT) CAPS Take 1 capsule by mouth daily. (Patient taking differently: Take 2,000 capsules by mouth daily.) 30 capsule    clopidogrel (PLAVIX) 75 MG tablet Take 1 tablet by mouth once daily 90 tablet 0   Cyanocobalamin (VITAMIN B 12) 500 MCG TABS Take 500 mcg by mouth daily.     Elderberry 500 MG CAPS Take 1 capsule by mouth daily. (Patient taking differently: Take 500 mg by mouth daily. gummy)     famotidine (PEPCID) 20 MG tablet TAKE 1 TABLET BY MOUTH AT BEDTIME 30 tablet 0   raloxifene (EVISTA) 60 MG tablet Take 60 mg by mouth daily.     rosuvastatin (CRESTOR) 20 MG tablet TAKE 1 TABLET BY MOUTH IN THE MORNING. PLEASE CALL [phone] TO SCHEDULE YEARLY VISIT. 90 tablet 0   diltiazem (CARDIZEM) 30 MG tablet Take one tablet as needed for fast heart rate (Patient not taking: Reported on 03/07/2023) 30 tablet 0   No current facility-administered medications for this  visit.     Physical Exam    VS:  BP 138/80 (BP Location: Left Arm, Patient Position: Sitting, Cuff Size: Normal)   Pulse 65   Ht 5\' 5"  (1.651 m)   Wt 123 lb 6 oz (56 kg)   SpO2 97%   BMI 20.53 kg/m  , BMI Body mass index is 20.53 kg/m.       GEN: Well nourished, well developed, in no acute distress. HEENT: normal. Neck: Supple, no JVD, carotid bruits, or masses. Cardiac: RRR, no murmurs, rubs, or gallops. No clubbing, cyanosis, edema.  Radials 2+/PT 2+ and equal bilaterally.  Respiratory:  Respirations regular and unlabored, clear to auscultation bilaterally. GI: Soft, nontender, nondistended, BS + x 4. MS: no deformity or atrophy. Skin: warm and dry, no rash. Neuro:  Strength and sensation are intact. Psych: Normal affect.  Accessory Clinical Findings    ECG personally reviewed by me today - EKG Interpretation Date/Time:  Wednesday March 07 2023 10:34:37 EST Ventricular Rate:  65 PR Interval:  188 QRS Duration:  86 QT Interval:  400 QTC Calculation: 416 R Axis:   75  Text Interpretation: Normal sinus rhythm Normal ECG Confirmed by Nicolasa Ducking [phone] on 03/07/2023 10:53:14 AM  - no acute changes.  Lab Results  Component Value Date   WBC 4.3 07/25/2021   HGB 14.8 07/25/2021   HCT 44.3 07/25/2021   MCV 84.7 07/25/2021   PLT 174 07/25/2021   Lab Results  Component Value Date   CREATININE 0.69 07/25/2021   BUN 16 07/25/2021   NA 138 07/25/2021   K 3.8 07/25/2021   CL 100 07/25/2021   CO2 30 07/25/2021   Lab Results  Component Value Date   ALT 21 05/04/2021   AST 25 05/04/2021   ALKPHOS 123 05/04/2021   BILITOT 0.8 05/04/2021   Lab Results  Component Value Date   CHOL 137 05/04/2021   HDL 80 05/04/2021   LDLCALC 45 05/04/2021   TRIG 60 05/04/2021   CHOLHDL 1.7 05/04/2021    Lab Results  Component Value Date   TSH 3.986 05/04/2021       Assessment & Plan    1.  CAD: Status post PCI drug-eluting stent placement to the LAD in September  2022.  She remains active, exercising on an exercise bicycle for 20 minutes most days of the week without chest pain or dyspnea.  ECG is unremarkable today.  Continue aspirin, statin, and Plavix.  2.  PSVT/history of atrial  flutter/sinus tachycardia: Status post catheter ablation for atrial flutter in November 2022 (no longer on oral anticoagulation) with catheter ablation for symptomatic SVT in June 2023.  Since then, she has had documented intermittent elevations in heart rates, with sinus tachycardia on her Kardia mobile device.  This occurs predominantly in the mornings after getting out of the shower, lasting 5 minutes, resolving spontaneously.  Occurrence and symptoms are stable over the past year.  Recommended she try eating her breakfast before taking her shower to see if this makes any difference for her.  She will continue to monitor symptoms and heart rhythm with her Kardia mobile device (strips reviewed today-sinus rhythm to sinus tachycardia).  3.  Hyperlipidemia: Lipids are followed by primary care provider.  She says that she had lipids checked in October and was told everything looked okay.  She remains on rosuvastatin 20 mg daily.    4.  GERD: No complaints today.  On Pepcid.  5.  Disposition: Patient wishes to follow-up in 12 months.  Advised to contact us sooner if necessary.  Nicolasa Ducking, NP 03/07/2023, 11:16 AM

## 2023-03-08 ENCOUNTER — Inpatient Hospital Stay: Payer: Medicare PPO | Attending: Hematology and Oncology | Admitting: Hematology and Oncology

## 2023-03-08 VITALS — BP 150/53 | HR 67 | Temp 97.5°F | Resp 18 | Ht 65.0 in | Wt 123.2 lb

## 2023-03-08 DIAGNOSIS — Z170 Estrogen receptor positive status [ER+]: Secondary | ICD-10-CM | POA: Diagnosis not present

## 2023-03-08 DIAGNOSIS — C50411 Malignant neoplasm of upper-outer quadrant of right female breast: Secondary | ICD-10-CM | POA: Diagnosis not present

## 2023-03-08 DIAGNOSIS — Z853 Personal history of malignant neoplasm of breast: Secondary | ICD-10-CM | POA: Diagnosis not present

## 2023-03-08 DIAGNOSIS — M810 Age-related osteoporosis without current pathological fracture: Secondary | ICD-10-CM | POA: Diagnosis not present

## 2023-03-08 DIAGNOSIS — N951 Menopausal and female climacteric states: Secondary | ICD-10-CM | POA: Insufficient documentation

## 2023-03-08 NOTE — Progress Notes (Signed)
Patient Care Team: Richardean Chimera, MD as PCP - General (Family Medicine) Mariah Milling Tollie Pizza, MD as PCP - Cardiology (Cardiology) Regan Lemming, MD as PCP - Electrophysiology (Cardiology)  DIAGNOSIS:  Encounter Diagnosis  Name Primary?   Malignant neoplasm of upper-outer quadrant of right breast in female, estrogen receptor positive (HCC) Yes    SUMMARY OF ONCOLOGIC HISTORY: Oncology History  Malignant neoplasm of upper-outer quadrant of right breast in female, estrogen receptor positive (HCC)  10/09/2017 Initial Diagnosis   Palpable lump in the right breast with a history of right breast atypia in mother and sister with breast cancers, 1.9 cm mass at 10 o'clock position right breast biopsy 10 o'clock position 5 cm from nipple: Grade 1 invasive ductal carcinoma ER 60%, PR 40%, Ki-67 2%, HER-2 negative ratio 1.35 T1cN1a stage Ib AJCC 8   11/26/2017 Surgery   Right lumpectomy: Grade 2 IDC 1.9 cm, margins negative, 1/2 lymph nodes positive, ER 60%, PR 40%, HER-2 2+ by IHC negative by FISH ratio 1.35, copy #2.1, Ki-67 2%, T1cN1a stage IA    12/03/2017 Cancer Staging   Staging form: Breast, AJCC 8th Edition - Pathologic: Stage IA (pT1c, pN1a, cM0, G2, ER+, PR+, HER2-) - Signed by Serena Croissant, MD on 12/03/2017   01/17/2018 - 02/25/2018 Radiation Therapy   Adj XRT at [facility]     CHIEF COMPLIANT: Surveillance of breast cancer  HISTORY OF PRESENT ILLNESS:   History of Present Illness   The patient, a breast cancer survivor of over five years, presents with intermittent hot flashes. She describes the sensation as similar to being pregnant, with her cheeks turning red 'like a volcano is erupting.' The hot flashes are not daily, but occur sporadically. She has been on Evista for some time, and the hot flashes have only recently become noticeable.  The patient also reports occasional sharp pain in the area where her lymph nodes were removed, a symptom she has experienced for years  following her surgery and radiation treatment.  In addition, the patient mentions that she has been taking Evista for both her breast cancer and for bone health. She expresses hope that her bone density has improved, as her sister recently had a significant improvement in her bone density.         ALLERGIES:  is allergic to penicillins, erythromycin, sulfa antibiotics, nitrofurantoin, penicillin g, and sulfasalazine.  MEDICATIONS:  Current Outpatient Medications  Medication Sig Dispense Refill   Ascorbic Acid (VITAMIN C) 1000 MG tablet Take 1 tablet (1,000 mg total) by mouth daily. (Patient taking differently: Take 500 mg by mouth daily.)     aspirin EC 81 MG tablet Take 1 tablet (81 mg total) by mouth daily. Swallow whole. 90 tablet 3   Calcium 600-400 MG-UNIT CHEW Chew 1 tablet by mouth in the morning.     Cholecalciferol (VITAMIN D) 125 MCG (5000 UT) CAPS Take 1 capsule by mouth daily. (Patient taking differently: Take 2,000 capsules by mouth daily.) 30 capsule    clopidogrel (PLAVIX) 75 MG tablet Take 1 tablet by mouth once daily 90 tablet 0   Cyanocobalamin (VITAMIN B 12) 500 MCG TABS Take 500 mcg by mouth daily.     diltiazem (CARDIZEM) 30 MG tablet Take one tablet as needed for fast heart rate (Patient not taking: Reported on 03/07/2023) 30 tablet 0   Elderberry 500 MG CAPS Take 1 capsule by mouth daily. (Patient taking differently: Take 500 mg by mouth daily. gummy)     famotidine (PEPCID) 20  MG tablet TAKE 1 TABLET BY MOUTH AT BEDTIME 30 tablet 0   raloxifene (EVISTA) 60 MG tablet Take 60 mg by mouth daily.     rosuvastatin (CRESTOR) 20 MG tablet TAKE 1 TABLET BY MOUTH IN THE MORNING. PLEASE CALL [phone] TO SCHEDULE YEARLY VISIT. 90 tablet 0   No current facility-administered medications for this visit.    PHYSICAL EXAMINATION: ECOG PERFORMANCE STATUS: 1 - Symptomatic but completely ambulatory  Vitals:   03/08/23 1036  BP: (!) 150/53  Pulse: 67  Resp: 18  Temp: (!)  97.5 F (36.4 C)  SpO2: 100%   Filed Weights   03/08/23 1036  Weight: 123 lb 3.2 oz (55.9 kg)    Physical Exam no palpable lumps or nodules in bilateral breasts or axilla        (exam performed in the presence of a chaperone)  LABORATORY DATA:  I have reviewed the data as listed    Latest Ref Rng & Units 07/25/2021    9:20 AM 05/04/2021    8:39 AM 10/16/2020    4:20 AM  CMP  Glucose 70 - 99 mg/dL 74  829  97   BUN 8 - 23 mg/dL 16  18  14    Creatinine 0.44 - 1.00 mg/dL 5.62  1.30  8.65   Sodium 135 - 145 mmol/L 138  137  136   Potassium 3.5 - 5.1 mmol/L 3.8  4.4  4.1   Chloride 98 - 111 mmol/L 100  100  102   CO2 22 - 32 mmol/L 30  29  27    Calcium 8.9 - 10.3 mg/dL 9.7  9.7  9.1   Total Protein 6.5 - 8.1 g/dL  7.8    Total Bilirubin 0.3 - 1.2 mg/dL  0.8    Alkaline Phos 38 - 126 U/L  123    AST 15 - 41 U/L  25    ALT 0 - 44 U/L  21      Lab Results  Component Value Date   WBC 4.3 07/25/2021   HGB 14.8 07/25/2021   HCT 44.3 07/25/2021   MCV 84.7 07/25/2021   PLT 174 07/25/2021   NEUTROABS 4.4 10/04/2020    ASSESSMENT & PLAN:  Malignant neoplasm of upper-outer quadrant of right breast in female, estrogen receptor positive (HCC) 11/26/2017 right lumpectomy: Grade 2 IDC 1.9 cm, margins negative, 1/2 lymph nodes positive, ER 60%, PR 40%, HER-2 2+ by IHC negative by FISH ratio 1.35, copy #2.1, Ki-67 2%, T1cN1a stage IA Patient had Mammaprint prior to surgery and it was low risk luminal type A Adjuvant radiation therapy at [facility] completed 02/25/2018   Antiestrogen therapy: Patient took neoadjuvant letrozole and could not tolerate it.  She does not want to take any antiestrogen therapy  Ablation Nov 2021: for A.Fib She also had 2 heart catheterizations for calcifications.   Breast cancer surveillance: 1.  Mammogram 10/25/2022: Benign breast density category D 2.  Annual breast exams: 03/08/2023: Benign 3.  Breast MRI 12/20/2020: No evidence of malignancy.  Breast density  category D I recommended switching her to contrast-enhanced mammograms.   Bone density 12/09/2021: T-score -3.5: Osteoporosis: She is currently taking raloxifene.     ------------------------------------- Assessment and Plan    Breast Cancer Surveillance 5 years post-treatment, currently on Evista. Discussed the transition from mammogram and MRI to contrast-enhanced mammograms (CEMs) for surveillance. -Schedule CEM for breast cancer surveillance (last mammogram on 10/24/2022). -Continue Evista for bone health and breast cancer prevention.  Hot Flashes Intermittent  hot flashes, possibly related to Evista. -Continue Evista, monitor symptoms.  Pain in Lymph Node Area Intermittent pain in the area of previous lymph node removal. -Recognized as a common symptom post-surgery and radiation, no further action required at this time.  Blood Pressure Measurements Difficulty with blood draws on the left side, question about using the right arm (previous lymph node removal). -Can use right arm for blood pressure measurements and blood draws if necessary, prefer to avoid if possible.     Return to clinic on an as-needed basis.     Orders Placed This Encounter  Procedures   MM 2D DIAG BILAT WITH CONTRAST BCG ONLY    Standing Status:   Future    Expected Date:   10/26/2023    Expiration Date:   03/07/2024    Reason for Exam (SYMPTOM  OR DIAGNOSIS REQUIRED):   annual high density breast cancer surveillance    If indicated for the ordered procedure, I authorize the administration of contrast media per Radiology protocol:   Yes    Does the patient have a contrast media/X-ray dye allergy?:   Yes    Preferred [facility]    Release to patient:   Immediate   The patient has a good understanding of the overall plan. she agrees with it. she will call with any problems that may develop before the next visit here. Total time spent: 30 mins including face to face time and time  spent for planning, charting and co-ordination of care   Tamsen Meek, MD 03/08/23

## 2023-03-08 NOTE — Assessment & Plan Note (Signed)
11/26/2017 right lumpectomy: Grade 2 IDC 1.9 cm, margins negative, 1/2 lymph nodes positive, ER 60%, PR 40%, HER-2 2+ by IHC negative by FISH ratio 1.35, copy #2.1, Ki-67 2%, T1cN1a stage IA Patient had Mammaprint prior to surgery and it was low risk luminal type A Adjuvant radiation therapy at [facility] completed 02/25/2018   Antiestrogen therapy: Patient took neoadjuvant letrozole and could not tolerate it.  She does not want to take any antiestrogen therapy  Ablation Nov 2021: for A.Fib She also had 2 heart catheterizations for calcifications.   Breast cancer surveillance: 1.  Mammogram 10/25/2022: Benign breast density category D 2.  Annual breast exams: 03/08/2023: Benign 3.  Breast MRI 12/20/2020: No evidence of malignancy.  Breast density category D MRIs will be ordered for October 2024.  After that breast MRIs can be done every 3 years.  This is mainly for high degree of breast density.    Bone density 12/09/2021: T-score -3.5: Osteoporosis: She is currently taking raloxifene.   She would like her mammograms and breast MRIs to be done at [facility].   Return to clinic in 1 year for follow-up and after that she could be seen on an as-needed basis.

## 2023-03-20 DIAGNOSIS — C44319 Basal cell carcinoma of skin of other parts of face: Secondary | ICD-10-CM | POA: Diagnosis not present

## 2023-03-20 DIAGNOSIS — Z85828 Personal history of other malignant neoplasm of skin: Secondary | ICD-10-CM | POA: Diagnosis not present

## 2023-03-30 ENCOUNTER — Other Ambulatory Visit: Payer: Self-pay | Admitting: Nurse Practitioner

## 2023-04-17 ENCOUNTER — Other Ambulatory Visit: Payer: Self-pay | Admitting: Cardiovascular Disease

## 2023-05-29 ENCOUNTER — Other Ambulatory Visit: Payer: Self-pay | Admitting: Cardiovascular Disease

## 2023-06-19 ENCOUNTER — Other Ambulatory Visit
Admission: RE | Admit: 2023-06-19 | Discharge: 2023-06-19 | Disposition: A | Discharge status: Home / Self Care | Attending: Family Medicine | Admitting: Family Medicine

## 2023-06-19 ENCOUNTER — Other Ambulatory Visit (HOSPITAL_COMMUNITY): Payer: Self-pay | Admitting: Obstetrics and Gynecology

## 2023-06-19 DIAGNOSIS — N952 Postmenopausal atrophic vaginitis: Secondary | ICD-10-CM | POA: Diagnosis not present

## 2023-06-19 DIAGNOSIS — E559 Vitamin D deficiency, unspecified: Secondary | ICD-10-CM | POA: Diagnosis not present

## 2023-06-19 DIAGNOSIS — M810 Age-related osteoporosis without current pathological fracture: Secondary | ICD-10-CM | POA: Diagnosis not present

## 2023-06-19 DIAGNOSIS — Z1211 Encounter for screening for malignant neoplasm of colon: Secondary | ICD-10-CM | POA: Diagnosis not present

## 2023-06-19 DIAGNOSIS — E782 Mixed hyperlipidemia: Secondary | ICD-10-CM | POA: Diagnosis not present

## 2023-06-19 DIAGNOSIS — Z853 Personal history of malignant neoplasm of breast: Secondary | ICD-10-CM | POA: Diagnosis not present

## 2023-06-19 DIAGNOSIS — Z1231 Encounter for screening mammogram for malignant neoplasm of breast: Secondary | ICD-10-CM | POA: Diagnosis not present

## 2023-06-19 DIAGNOSIS — Z1330 Encounter for screening examination for mental health and behavioral disorders, unspecified: Secondary | ICD-10-CM | POA: Diagnosis not present

## 2023-06-19 DIAGNOSIS — Z90710 Acquired absence of both cervix and uterus: Secondary | ICD-10-CM | POA: Diagnosis not present

## 2023-06-19 DIAGNOSIS — Z01419 Encounter for gynecological examination (general) (routine) without abnormal findings: Secondary | ICD-10-CM | POA: Diagnosis not present

## 2023-06-19 DIAGNOSIS — E039 Hypothyroidism, unspecified: Secondary | ICD-10-CM | POA: Diagnosis not present

## 2023-06-19 LAB — COMPREHENSIVE METABOLIC PANEL WITH GFR
ALT: 20 U/L (ref 0–44)
AST: 27 U/L (ref 15–41)
Albumin: 4.4 g/dL (ref 3.5–5.0)
Alkaline Phosphatase: 88 U/L (ref 38–126)
Anion gap: 8 (ref 5–15)
BUN: 22 mg/dL (ref 8–23)
CO2: 27 mmol/L (ref 22–32)
Calcium: 9.3 mg/dL (ref 8.9–10.3)
Chloride: 100 mmol/L (ref 98–111)
Creatinine, Ser: 0.75 mg/dL (ref 0.44–1.00)
GFR, Estimated: >60 mL/min (ref >60)
Glucose, Bld: 96 mg/dL (ref 70–99)
Potassium: 4.4 mmol/L (ref 3.5–5.1)
Sodium: 135 mmol/L (ref 135–145)
Total Bilirubin: 0.9 mg/dL (ref 0.0–1.2)
Total Protein: 7.6 g/dL (ref 6.5–8.1)

## 2023-06-19 LAB — CBC
HCT: 44.5 % (ref 36.0–46.0)
Hemoglobin: 15.2 g/dL — ABNORMAL HIGH (ref 12.0–15.0)
MCH: 29.6 pg (ref 26.0–34.0)
MCHC: 34.2 g/dL (ref 30.0–36.0)
MCV: 86.6 fL (ref 80.0–100.0)
Platelets: 189 10*3/uL (ref 150–400)
RBC: 5.14 MIL/uL — ABNORMAL HIGH (ref 3.87–5.11)
RDW: 12.6 % (ref 11.5–15.5)
WBC: 6.3 10*3/uL (ref 4.0–10.5)
nRBC: 0 % (ref 0.0–0.2)

## 2023-06-19 LAB — TSH: TSH: 2.362 u[IU]/mL (ref 0.350–4.500)

## 2023-06-19 LAB — VITAMIN D 25 HYDROXY (VIT D DEFICIENCY, FRACTURES): Vit D, 25-Hydroxy: 81.34 ng/mL (ref 30–100)

## 2023-06-19 LAB — LIPID PANEL
Cholesterol: 124 mg/dL (ref 0–200)
HDL: 75 mg/dL (ref >40)
LDL Cholesterol: 36 mg/dL (ref 0–99)
Total CHOL/HDL Ratio: 1.7 ratio
Triglycerides: 65 mg/dL (ref <150)
VLDL: 13 mg/dL (ref 0–40)

## 2023-06-26 DIAGNOSIS — Z1211 Encounter for screening for malignant neoplasm of colon: Secondary | ICD-10-CM | POA: Diagnosis not present

## 2023-07-03 LAB — COLOGUARD: COLOGUARD: NEGATIVE

## 2023-07-11 ENCOUNTER — Ambulatory Visit
Admission: RE | Admit: 2023-07-11 | Discharge: 2023-07-11 | Disposition: A | Discharge status: Home / Self Care | Source: Ambulatory Visit | Attending: Obstetrics and Gynecology | Admitting: Obstetrics and Gynecology

## 2023-07-11 DIAGNOSIS — M810 Age-related osteoporosis without current pathological fracture: Secondary | ICD-10-CM | POA: Diagnosis not present

## 2023-07-11 DIAGNOSIS — Z780 Asymptomatic menopausal state: Secondary | ICD-10-CM | POA: Diagnosis not present

## 2023-09-21 DIAGNOSIS — H04129 Dry eye syndrome of unspecified lacrimal gland: Secondary | ICD-10-CM | POA: Diagnosis not present

## 2023-09-21 DIAGNOSIS — H2513 Age-related nuclear cataract, bilateral: Secondary | ICD-10-CM | POA: Diagnosis not present

## 2023-10-17 DIAGNOSIS — M5030 Other cervical disc degeneration, unspecified cervical region: Secondary | ICD-10-CM | POA: Diagnosis not present

## 2023-10-17 DIAGNOSIS — Z6821 Body mass index (BMI) 21.0-21.9, adult: Secondary | ICD-10-CM | POA: Diagnosis not present

## 2023-10-19 ENCOUNTER — Telehealth: Payer: Self-pay | Admitting: *Deleted

## 2023-10-19 NOTE — Telephone Encounter (Signed)
 Received call pt stating she is experiencing trouble scheduling contrast enhanced mammogram at GI. MD ordered imaging with expected date of 10/26/23.  RN emailed scheduling team as well as MD at GI to schedule pt. Pt notified to contact our office in 1 week if she does not hear from their office. Pt verbalized understanding.

## 2023-10-31 ENCOUNTER — Other Ambulatory Visit: Payer: Self-pay | Admitting: Hematology and Oncology

## 2023-10-31 ENCOUNTER — Ambulatory Visit
Admission: RE | Admit: 2023-10-31 | Discharge: 2023-10-31 | Disposition: A | Discharge status: Home / Self Care | Source: Ambulatory Visit | Attending: Hematology and Oncology | Admitting: Hematology and Oncology

## 2023-10-31 DIAGNOSIS — R928 Other abnormal and inconclusive findings on diagnostic imaging of breast: Secondary | ICD-10-CM | POA: Diagnosis not present

## 2023-10-31 DIAGNOSIS — Z170 Estrogen receptor positive status [ER+]: Secondary | ICD-10-CM

## 2023-10-31 MED ORDER — IOPAMIDOL (ISOVUE-370) INJECTION 76%
100.0000 mL | Freq: Once | INTRAVENOUS | Status: AC | PRN
  Administered 2023-10-31: 100 mL via INTRAVENOUS

## 2023-12-06 DIAGNOSIS — R2231 Localized swelling, mass and lump, right upper limb: Secondary | ICD-10-CM | POA: Diagnosis not present

## 2023-12-07 ENCOUNTER — Encounter: Payer: Self-pay | Admitting: Obstetrics and Gynecology

## 2023-12-07 ENCOUNTER — Other Ambulatory Visit: Payer: Self-pay | Admitting: Obstetrics and Gynecology

## 2023-12-07 DIAGNOSIS — R2231 Localized swelling, mass and lump, right upper limb: Secondary | ICD-10-CM

## 2023-12-11 ENCOUNTER — Inpatient Hospital Stay
Admission: RE | Admit: 2023-12-11 | Discharge: 2023-12-11 | Attending: Obstetrics and Gynecology | Admitting: Obstetrics and Gynecology

## 2023-12-11 ENCOUNTER — Ambulatory Visit
Admission: RE | Admit: 2023-12-11 | Discharge: 2023-12-11 | Disposition: A | Discharge status: Home / Self Care | Source: Ambulatory Visit | Attending: Obstetrics and Gynecology | Admitting: Obstetrics and Gynecology

## 2023-12-11 DIAGNOSIS — R2231 Localized swelling, mass and lump, right upper limb: Secondary | ICD-10-CM

## 2023-12-11 DIAGNOSIS — R928 Other abnormal and inconclusive findings on diagnostic imaging of breast: Secondary | ICD-10-CM | POA: Diagnosis not present

## 2023-12-11 DIAGNOSIS — N6489 Other specified disorders of breast: Secondary | ICD-10-CM | POA: Diagnosis not present

## 2023-12-13 ENCOUNTER — Encounter

## 2023-12-13 ENCOUNTER — Other Ambulatory Visit

## 2024-01-08 DIAGNOSIS — E538 Deficiency of other specified B group vitamins: Secondary | ICD-10-CM | POA: Diagnosis not present

## 2024-01-08 DIAGNOSIS — K2100 Gastro-esophageal reflux disease with esophagitis, without bleeding: Secondary | ICD-10-CM | POA: Diagnosis not present

## 2024-01-08 DIAGNOSIS — K760 Fatty (change of) liver, not elsewhere classified: Secondary | ICD-10-CM | POA: Diagnosis not present

## 2024-01-08 DIAGNOSIS — E782 Mixed hyperlipidemia: Secondary | ICD-10-CM | POA: Diagnosis not present

## 2024-01-08 DIAGNOSIS — M810 Age-related osteoporosis without current pathological fracture: Secondary | ICD-10-CM | POA: Diagnosis not present

## 2024-01-08 DIAGNOSIS — I25118 Atherosclerotic heart disease of native coronary artery with other forms of angina pectoris: Secondary | ICD-10-CM | POA: Diagnosis not present

## 2024-01-08 DIAGNOSIS — Z6821 Body mass index (BMI) 21.0-21.9, adult: Secondary | ICD-10-CM | POA: Diagnosis not present

## 2024-02-27 ENCOUNTER — Ambulatory Visit: Attending: Nurse Practitioner | Admitting: Nurse Practitioner

## 2024-02-27 ENCOUNTER — Encounter: Payer: Self-pay | Admitting: Nurse Practitioner

## 2024-02-27 ENCOUNTER — Other Ambulatory Visit
Admission: RE | Admit: 2024-02-27 | Discharge: 2024-02-27 | Disposition: A | Discharge status: Home / Self Care | Source: Ambulatory Visit | Attending: Nurse Practitioner | Admitting: Nurse Practitioner

## 2024-02-27 ENCOUNTER — Ambulatory Visit: Payer: Self-pay | Admitting: Nurse Practitioner

## 2024-02-27 VITALS — BP 132/88 | HR 73 | Ht 65.0 in | Wt 129.0 lb

## 2024-02-27 DIAGNOSIS — I4891 Unspecified atrial fibrillation: Secondary | ICD-10-CM | POA: Diagnosis not present

## 2024-02-27 DIAGNOSIS — I483 Typical atrial flutter: Secondary | ICD-10-CM | POA: Insufficient documentation

## 2024-02-27 DIAGNOSIS — I471 Supraventricular tachycardia, unspecified: Secondary | ICD-10-CM

## 2024-02-27 DIAGNOSIS — I480 Paroxysmal atrial fibrillation: Secondary | ICD-10-CM

## 2024-02-27 DIAGNOSIS — E785 Hyperlipidemia, unspecified: Secondary | ICD-10-CM

## 2024-02-27 DIAGNOSIS — I2510 Atherosclerotic heart disease of native coronary artery without angina pectoris: Secondary | ICD-10-CM | POA: Diagnosis not present

## 2024-02-27 LAB — CBC
HCT: 44.3 % (ref 36.0–46.0)
Hemoglobin: 15.2 g/dL — ABNORMAL HIGH (ref 12.0–15.0)
MCH: 29.1 pg (ref 26.0–34.0)
MCHC: 34.3 g/dL (ref 30.0–36.0)
MCV: 84.9 fL (ref 80.0–100.0)
Platelets: 205 K/uL (ref 150–400)
RBC: 5.22 MIL/uL — ABNORMAL HIGH (ref 3.87–5.11)
RDW: 12.8 % (ref 11.5–15.5)
WBC: 7.2 K/uL (ref 4.0–10.5)
nRBC: 0 % (ref 0.0–0.2)

## 2024-02-27 LAB — MAGNESIUM: Magnesium: 2.2 mg/dL (ref 1.7–2.4)

## 2024-02-27 LAB — BASIC METABOLIC PANEL WITH GFR
Anion gap: 9 (ref 5–15)
BUN: 20 mg/dL (ref 8–23)
CO2: 27 mmol/L (ref 22–32)
Calcium: 9.8 mg/dL (ref 8.9–10.3)
Chloride: 101 mmol/L (ref 98–111)
Creatinine, Ser: 0.78 mg/dL (ref 0.44–1.00)
GFR, Estimated: >60 mL/min
Glucose, Bld: 91 mg/dL (ref 70–99)
Potassium: 4.5 mmol/L (ref 3.5–5.1)
Sodium: 137 mmol/L (ref 135–145)

## 2024-02-27 LAB — TSH: TSH: 2.74 u[IU]/mL (ref 0.350–4.500)

## 2024-02-27 MED ORDER — METOPROLOL TARTRATE 25 MG PO TABS: 25.0000 mg | ORAL_TABLET | Freq: Two times a day (BID) | ORAL | 3 refills | Status: AC | PRN

## 2024-02-27 MED ORDER — APIXABAN 5 MG PO TABS: 5.0000 mg | ORAL_TABLET | Freq: Two times a day (BID) | ORAL | 3 refills | Status: AC

## 2024-02-27 NOTE — Patient Instructions (Signed)
 Medication Instructions:  Your physician recommends the following medication changes.  STOP TAKING: Stop Aspirin   START TAKING: Eliquis  5 MG twice daily Metoprolol  Tartrate 25 MG twice daily as needed    *If you need a refill on your cardiac medications before your next appointment, please call your pharmacy*  Lab Work: Your provider would like for you to have following labs drawn today CBC, BMET, Magnesium and TSH.    If you have labs (blood work) drawn today and your tests are completely normal, you will receive your results only by: MyChart Message (if you have MyChart) OR A paper copy in the mail If you have any lab test that is abnormal or we need to change your treatment, we will call you to review the results.  Testing/Procedures: Your physician has requested that you have an echocardiogram. Echocardiography is a painless test that uses sound waves to create images of your heart. It provides your doctor with information about the size and shape of your heart and how well your hearts chambers and valves are working.   You may receive an ultrasound enhancing agent through an IV if needed to better visualize your heart during the echo. This procedure takes approximately one hour.  There are no restrictions for this procedure.  This will take place at [address] [facility] [address] [facility], [place] [address]  Please note: We ask at that you not bring children with you during ultrasound (echo/ vascular) testing. Due to room size and safety concerns, children are not allowed in the ultrasound rooms during exams. Our front office staff cannot provide observation of children in our lobby area while testing is being conducted. An adult accompanying a patient to their appointment will only be allowed in the ultrasound room at the discretion of the ultrasound technician under special circumstances. We apologize for any inconvenience.   Follow-Up: At [facility], you  and your health needs are our priority.  As part of our continuing mission to provide you with exceptional heart care, our providers are all part of one team.  This team includes your primary Cardiologist (physician) and Advanced Practice Providers or APPs (Physician Assistants and Nurse Practitioners) who all work together to provide you with the care you need, when you need it.  Your next appointment:   4 -6 week(s)  Provider:   Evalene Lunger, MD    We recommend signing up for the patient portal called MyChart.  Sign up information is provided on this After Visit Summary.  MyChart is used to connect with patients for Virtual Visits (Telemedicine).  Patients are able to view lab/test results, encounter notes, upcoming appointments, etc.  Non-urgent messages can be sent to your provider as well.   To learn more about what you can do with MyChart, go to [email or web address].

## 2024-02-27 NOTE — Progress Notes (Addendum)
 That migrated well   Office Visit    Patient Name: Theresa Hale Date of Encounter: 02/27/2024  Primary Care Provider:  Toribio Jerel KANDICE, MD Primary Cardiologist:  Evalene Lunger, MD  Cardiology APP:  Vivienne Lonni Ingle, NP  Electrophysiologist:  Soyla Gladis Norton, MD   Chief Complaint    69 y.o. female with a history of atrial flutter, breast cancer, CAD, hyperlipidemia, GERD, and PSVT/AVNRT, who presents for follow-up related to new documentation of atrial fibrillation.  Past Medical History   Subjective   Past Medical History:  Diagnosis Date   Atrial flutter (HCC)    a. Dx in 2018. Initially only a single episode. (CHA2DS2VASc = 2); b. 11/2020 Recurrent Aflutter s/p DCCV. Eliquis  started; c. 12/2020 s/p RFCA; d. 01/2021 Eliquis  d/c'd.   AVNRT (AV nodal re-entry tachycardia)    a.  07/2021 s/p successful RFCA.   Breast cancer (HCC)    CAD (coronary artery disease)    a. 07/2019 Cardiac CT: Ca2+ 262 (90th %'ile); b. 09/2020 Cor CTA: Sev LAD dzs w/ abnl FFR; c. 10/2020 PCI: LM nl, LAD 80p/6m (shockwave lithotripsy + 2.5x38 Onyx DES), LCX mild diff dzs, RCA mild diff dzs.   GERD (gastroesophageal reflux disease)    History of echocardiogram    a. 06/2016 Echo: EF 60-65%, no rwma. Mild TR; b. 04/2021 Echo: EF 60-65%, no rwma, nl RV fxn, RVSP 27.27mmHg.   PAF (paroxysmal atrial fibrillation) (HCC)    a. Dx 02/2024-->Eliquis  5 BID (CHA2DS2VASc = 3).   Personal history of radiation therapy 02/2018   PONV (postoperative nausea and vomiting)    PSVT (paroxysmal supraventricular tachycardia)    a. 06/2019 Zio: RSR, avg HR 71, max HR 222. 97 runs of SVT, fastest 222 (4 beats), longest 1:05 (164); b. 07/2020 Zio: Predominantly sinus rhythm @ 67 (49-197). 42 SVT runs, fastest 197 x 6 beats, longest 11.3 secs @ 127 bpm.  Triggered events associated with sinus tachycardia; c. 07/2021 s/p RFCA.   Past Surgical History:  Procedure Laterality Date   A-FLUTTER ABLATION N/A 12/26/2019    Procedure: A-FLUTTER ABLATION;  Surgeon: Norton Soyla Gladis, MD;  Location: [facility];  Service: Cardiovascular;  Laterality: N/A;   ABDOMINAL HYSTERECTOMY     ABLATION     BREAST BIOPSY Right 10/02/2017   BREAST EXCISIONAL BIOPSY Right 01/2002   BREAST LUMPECTOMY Right 2019   BREAST LUMPECTOMY WITH RADIOACTIVE SEED AND SENTINEL LYMPH NODE BIOPSY Right 11/26/2017   Procedure: RIGHT BREAST LUMPECTOMY WITH SENTINEL NODE MAPPING AND TARGETED NODE DISECTION ERAS PATHWAY;  Surgeon: Curvin Deward MOULD, MD;  Location: [facility];  Service: General;  Laterality: Right;   CESAREAN SECTION     CHOLECYSTECTOMY     CORONARY STENT INTERVENTION N/A 10/15/2020   Procedure: CORONARY STENT INTERVENTION;  Surgeon: Darron Deatrice LABOR, MD;  Location: [facility];  Service: Cardiovascular;  Laterality: N/A;   KNEE ARTHROSCOPY     LEFT HEART CATH AND CORONARY ANGIOGRAPHY N/A 10/08/2020   Procedure: LEFT HEART CATH AND CORONARY ANGIOGRAPHY;  Surgeon: Darron Deatrice LABOR, MD;  Location: [facility];  Service: Cardiovascular;  Laterality: N/A;   NECK SURGERY     SVT ABLATION N/A 08/10/2021   Procedure: SVT ABLATION;  Surgeon: Norton Soyla Gladis, MD;  Location: [facility];  Service: Cardiovascular;  Laterality: N/A;    Allergies  Allergies[1]     History of Present Illness      69 y.o. y/o female with the  above past medical history including atrial flutter, breast cancer, CAD, hyperlipidemia, GERD, and PSVT/AVNRT.  She was diagnosed with atrial flutter in 2018 and was initially placed on beta-blocker with subsequent cardioversion.  Echo in 2018 showed normal LV function and mild TR.  In the spring 2021, event monitoring was performed in the setting of palpitations and she was found to have frequent, brief runs of PSVT.  Beta-blocker usage was limited by relative hypotension and therefore, she took on an as-needed basis only.  She underwent coronary calcium  score in June  2021, and this was elevated at 262 (90th percentile).  She has since been on aspirin  and rosuvastatin  therapy.  In October 2021, she was seen in clinic with palpitations and rapid atrial flutter.  She was referred to the emergency department and underwent successful cardioversion.  She was subsequently placed on Eliquis .  She was evaluated by Dr. Inocencio (EP) and underwent successful catheter ablation for atrial flutter in November 2021.  Following a syncopal spell in May 2022, she underwent event monitoring, which showed predominantly sinus rhythm with 42 SVT runs (maximum duration 11.3 seconds, maximum heart rate 197).  Triggered events were associated with sinus tachycardia.  In the setting of ongoing presyncope and hypotension, beta-blocker therapy was discontinued in June 2022.  In the setting of chest pain in August 2022, she underwent coronary CT angiogram which showed calcium  score of 352 (91st percentile), mild stenosis in the OM1, and suggestion of severe stenosis in the proximal LAD.  FFRct was abnormal at 0.71, and she underwent diagnostic catheterization in September 2022 revealing 80% heavily calcified proximal LAD and 70% mid LAD stenosis.  The LAD was successfully treated with shockwave lithotripsy and drug-eluting stent placement.  She had recurrent prolonged episodes of palpitations with presyncope in early 2023 and was referred back to Dr. Inocencio.  She underwent successful ablation for AVNRT in June 2023.     Theresa Hale was last seen in cardiology clinic in January 2025 at which time she was doing well and exercising regularly.  She has generally done well over the past year but on February 14, 2024, she was preparing her breakfast and had sudden onset of pressure in her chest without dyspnea.  She checked her Kardia mobile and noted that her heart rates were elevated, up to 145 bpm, and also irregular.  Crist mobile suggested possible atrial fibrillation.  She continued to track her heart  rhythm and she returned to sinus rhythm after about 45 minutes.  She says at no point did she experience palpitations.  She has not had any recurrence.  She denies PND, orthopnea, dizziness, syncope, edema, or early satiety.    Objective   Home Medications    Current Outpatient Medications  Medication Sig Dispense Refill   apixaban  (ELIQUIS ) 5 MG TABS tablet Take 1 tablet (5 mg total) by mouth 2 (two) times daily. 180 tablet 3   Ascorbic Acid (VITAMIN C ) 1000 MG tablet Take 1 tablet (1,000 mg total) by mouth daily.     Calcium  600-400 MG-UNIT CHEW Chew 1 tablet by mouth in the morning.     Cholecalciferol  (VITAMIN D ) 125 MCG (5000 UT) CAPS Take 1 capsule by mouth daily. 30 capsule    clopidogrel  (PLAVIX ) 75 MG tablet Take 1 tablet by mouth once daily 90 tablet 3   Cyanocobalamin  (VITAMIN B 12) 500 MCG TABS Take 500 mcg by mouth daily.     Elderberry 500 MG CAPS Take 1 capsule by mouth daily.  famotidine  (PEPCID ) 40 MG tablet Take 40 mg by mouth 2 (two) times daily.     INTRAROSA  6.5 MG INST Place 1 suppository vaginally daily.     metoprolol  tartrate (LOPRESSOR ) 25 MG tablet Take 1 tablet (25 mg total) by mouth 2 (two) times daily as needed. 30 tablet 3   raloxifene (EVISTA) 60 MG tablet Take 60 mg by mouth daily.     rosuvastatin  (CRESTOR ) 20 MG tablet Take 1 tablet (20 mg total) by mouth daily. 90 tablet 3   diltiazem  (CARDIZEM ) 30 MG tablet Take one tablet as needed for fast heart rate (Patient not taking: Reported on 02/27/2024) 30 tablet 0   No current facility-administered medications for this visit.     Physical Exam    VS:  BP (!) 140/90 (BP Location: Left Arm, Patient Position: Sitting, Cuff Size: Normal)   Pulse 73   Ht 5' 5 (1.651 m)   Wt 129 lb (58.5 kg)   SpO2 100%   BMI 21.47 kg/m  , BMI Body mass index is 21.47 kg/m.        Vitals:   02/27/24 1022 02/27/24 1319  BP: (!) 140/90 132/88  Pulse: 73   SpO2: 100%       GEN: Well nourished, well developed, in no  acute distress. HEENT: normal. Neck: Supple, no JVD, carotid bruits, or masses. Cardiac: RRR, no murmurs, rubs, or gallops. No clubbing, cyanosis, edema.  Radials 2+/PT 2+ and equal bilaterally.  Respiratory:  Respirations regular and unlabored, clear to auscultation bilaterally. GI: Soft, nontender, nondistended, BS + x 4. MS: no deformity or atrophy. Skin: warm and dry, no rash. Neuro:  Strength and sensation are intact. Psych: Normal affect.  Accessory Clinical Findings    ECG personally reviewed by me today - EKG Interpretation Date/Time:  Wednesday February 27 2024 10:30:59 EST Ventricular Rate:  73 PR Interval:  198 QRS Duration:  94 QT Interval:  396 QTC Calculation: 436 R Axis:   79  Text Interpretation: Normal sinus rhythm Normal ECG Confirmed by Vivienne Bruckner [phone]) on 02/27/2024 10:44:34 AM  - no acute changes.  Lab Results  Component Value Date   WBC 7.2 02/27/2024   HGB 15.2 (H) 02/27/2024   HCT 44.3 02/27/2024   MCV 84.9 02/27/2024   PLT 205 02/27/2024   Lab Results  Component Value Date   CREATININE 0.78 02/27/2024   BUN 20 02/27/2024   NA 137 02/27/2024   K 4.5 02/27/2024   CL 101 02/27/2024   CO2 27 02/27/2024   Lab Results  Component Value Date   ALT 20 06/19/2023   AST 27 06/19/2023   ALKPHOS 88 06/19/2023   BILITOT 0.9 06/19/2023   Lab Results  Component Value Date   CHOL 124 06/19/2023   HDL 75 06/19/2023   LDLCALC 36 06/19/2023   TRIG 65 06/19/2023   CHOLHDL 1.7 06/19/2023    No results found for: HGBA1C Lab Results  Component Value Date   TSH 2.740 02/27/2024       Assessment & Plan    1.  Paroxysmal atrial fibrillation: Patient experienced a 45-minute episode of chest pressure associated with tachycardia.  She documented atrial fibrillation with rates up to 145 bpm on her Kardia mobile.  Strips reviewed on her phone by me today.  She is in sinus rhythm today and notes she has had no recurrence of chest pressure or  palpitations.  She does have a history of SVT and atrial flutter status post prior ablations.  CHA2DS2-VASc equals 3.  We had a long discussion regarding resumption of Eliquis  therapy at 5 mg twice daily and she is agreeable.  Will stop her aspirin .  Following up CBC, basic metabolic panel, magnesium, TSH, and echocardiogram.  I asked her to upload Kardia strips to Epic so that we have formal documentation of their existence.  We discussed referral back to electrophysiology to discuss further and at this time she wishes to defer.  Due to prior history of soft blood pressures, she has not tolerated daily AV nodal blocking agents.  In the setting, I have provided prescription for metoprolol  to tartrate 25 mg twice daily as needed.  For any recurrence, would have a low threshold to refer back to electrophysiology for consideration of antiarrhythmic therapy.  2. Coronary artery disease: Status post PCI and drug-eluting stent placement to the LAD in September 2022.  She did have an episode of chest pressure x 45 minutes in the setting of new onset rapid atrial fibrillation.  No recurrence since then.  She is otherwise relatively active without symptoms or limitations.  Follow-up echo as outlined above.  Stopping aspirin  in the setting of initiation of Eliquis .  She remains on statin and clopidogrel .  3.  PSVT/History of Atrial Flutter:  s/p RFCA for aflutter in 12/2020 (no longer on OAC), with RFCA for symptomatic SVT in 07/2021.  Paroxysmal atrial fibrillation identified as outlined above.  Resuming Eliquis  5 mg twice daily.  4.  Hyperlipidemia: Lipids followed by primary care provider.  Remains on rosuvastatin  20 mg daily.  She queried as to whether or not this dose can be reduced.  We discussed that goal LDL is at least less than 70 and with her cardiovascular history, I would not recommend reduction at this time.  5.  GERD: Stable on twice daily Pepcid .  6.  Disposition: Follow-up CBC, basic metabolic panel,  TSH, magnesium, and echo.  Follow-up in clinic in 4 to 6 weeks or sooner if necessary.  Lonni Meager, NP 02/27/2024, 12:58 PM  ADDENDUM  After speaking with Dr. Perla re: need for ongoing clopidogrel  therapy in light of initiation of eliquis  for Afib, given duration of time that has passed since LAD stenting, we agreed to discontinue clopidogrel .  Theresa Hale was notified via [facility] group.  Cont eliquis  as Rx.  F/u as planned.  Lonni Meager, NP 03/03/2024, 9:22 AM     [1]  Allergies Allergen Reactions   Penicillins Swelling    Reaction: Childhood   Erythromycin Nausea And Vomiting and Nausea Only   Sulfa Antibiotics Nausea And Vomiting and Rash   Nitrofurantoin Nausea Only   Penicillin G Swelling   Sulfasalazine Nausea And Vomiting and Nausea Only

## 2024-03-03 ENCOUNTER — Other Ambulatory Visit: Payer: Self-pay | Admitting: Nurse Practitioner

## 2024-03-04 ENCOUNTER — Telehealth: Payer: Self-pay | Admitting: Nurse Practitioner

## 2024-03-04 NOTE — Telephone Encounter (Signed)
"  ° °  Called pt to discuss questions related to coming off of clopidogrel .  Explained that in light of eliquis  initiation and stent placement > 12 mos ago (2022 - LAD), I discussed anticoagulation/antiplatelet strategy with Dr. Gollan, and we mutually agreed that given the above conditions, it's now appropriate to discontinue both asa/plavix  and continue eliquis  5 mg bid as recently Rx.  Pt was grateful for clarification.  She says that yesterday she developed discomfort in her lower abdomen that felt like GI gas.  She took Gas-X w/o relief.  Discomfort still present this AM though less severe.  Left lower abdomen is mildly sore to touch.  She denies any hardened areas, severe pain, bruising, melena, BRBPR, orthostasis, presyncope, syncope, or pallor.  We discussed that in the setting of eliquis  therapy, bleeding risk is increased.  I advised that if she develops any of the above symptoms that she should contact us  and/or consider ED eval for appropriate labs and imaging to r/o spontaneous abdominal/pelvic wall bleeding, GI bleeding, etc.  Caller verbalized understanding and was grateful for the call back.  Theresa Meager, NP 03/04/2024, 8:37 AM   "

## 2024-03-12 ENCOUNTER — Ambulatory Visit: Admitting: Nurse Practitioner

## 2024-03-17 ENCOUNTER — Ambulatory Visit

## 2024-03-17 DIAGNOSIS — I483 Typical atrial flutter: Secondary | ICD-10-CM

## 2024-03-17 LAB — ECHOCARDIOGRAM COMPLETE
AR max vel: 1.4 cm2
AV Area VTI: 1.43 cm2
AV Area mean vel: 1.34 cm2
AV Mean grad: 4 mmHg
AV Peak grad: 6.7 mmHg
Ao pk vel: 1.29 m/s
Area-P 1/2: 2.87 cm2
S' Lateral: 2.28 cm

## 2024-04-01 ENCOUNTER — Ambulatory Visit: Admitting: Cardiovascular Disease
